# Patient Record
Sex: Female | Born: 1935 | Race: White | Hispanic: No | Marital: Single | Smoking: Never smoker
Health system: Southern US, Community
[De-identification: ages and names within clinical notes are randomized; demographics above are authoritative.]

## PROBLEM LIST (undated history)

## (undated) DIAGNOSIS — I10 Essential (primary) hypertension: Secondary | ICD-10-CM

## (undated) DIAGNOSIS — F79 Unspecified intellectual disabilities: Secondary | ICD-10-CM

## (undated) DIAGNOSIS — M810 Age-related osteoporosis without current pathological fracture: Secondary | ICD-10-CM

## (undated) DIAGNOSIS — F819 Developmental disorder of scholastic skills, unspecified: Secondary | ICD-10-CM

## (undated) DIAGNOSIS — J45909 Unspecified asthma, uncomplicated: Secondary | ICD-10-CM

## (undated) DIAGNOSIS — G40909 Epilepsy, unspecified, not intractable, without status epilepticus: Secondary | ICD-10-CM

## (undated) HISTORY — PX: REDUCTION MAMMAPLASTY: SUR839

## (undated) HISTORY — PX: BREAST RECONSTRUCTION: SHX9

## (undated) HISTORY — PX: DENTAL SURGERY: SHX609

---

## 2007-02-21 ENCOUNTER — Emergency Department: Payer: Self-pay | Admitting: Emergency Medicine

## 2008-04-11 ENCOUNTER — Encounter: Admission: RE | Admit: 2008-04-11 | Discharge: 2008-04-11 | Payer: Self-pay

## 2009-05-15 ENCOUNTER — Encounter: Admission: RE | Admit: 2009-05-15 | Discharge: 2009-05-15 | Payer: Self-pay

## 2010-05-16 ENCOUNTER — Encounter: Admission: RE | Admit: 2010-05-16 | Discharge: 2010-05-16 | Payer: Self-pay | Admitting: Family Medicine

## 2011-07-15 ENCOUNTER — Other Ambulatory Visit: Payer: Self-pay | Admitting: Family Medicine

## 2011-07-15 DIAGNOSIS — Z1231 Encounter for screening mammogram for malignant neoplasm of breast: Secondary | ICD-10-CM

## 2011-08-07 ENCOUNTER — Ambulatory Visit
Admission: RE | Admit: 2011-08-07 | Discharge: 2011-08-07 | Disposition: A | Discharge status: Home / Self Care | Payer: Medicare Other | Source: Ambulatory Visit | Attending: Family Medicine | Admitting: Family Medicine

## 2011-08-07 DIAGNOSIS — Z1231 Encounter for screening mammogram for malignant neoplasm of breast: Secondary | ICD-10-CM

## 2012-12-02 ENCOUNTER — Other Ambulatory Visit: Payer: Self-pay | Admitting: Nurse Practitioner

## 2012-12-02 ENCOUNTER — Other Ambulatory Visit: Payer: Self-pay

## 2012-12-02 DIAGNOSIS — Z1231 Encounter for screening mammogram for malignant neoplasm of breast: Secondary | ICD-10-CM

## 2013-01-05 ENCOUNTER — Ambulatory Visit
Admission: RE | Admit: 2013-01-05 | Discharge: 2013-01-05 | Disposition: A | Discharge status: Home / Self Care | Payer: Medicare Other | Source: Ambulatory Visit | Attending: Nurse Practitioner | Admitting: Nurse Practitioner

## 2013-01-05 DIAGNOSIS — Z1231 Encounter for screening mammogram for malignant neoplasm of breast: Secondary | ICD-10-CM

## 2013-05-06 ENCOUNTER — Other Ambulatory Visit: Payer: Self-pay

## 2013-05-06 DIAGNOSIS — Z1231 Encounter for screening mammogram for malignant neoplasm of breast: Secondary | ICD-10-CM

## 2014-01-06 ENCOUNTER — Ambulatory Visit
Admission: RE | Admit: 2014-01-06 | Discharge: 2014-01-06 | Disposition: A | Discharge status: Home / Self Care | Payer: Medicare Other | Source: Ambulatory Visit

## 2014-01-06 ENCOUNTER — Encounter (INDEPENDENT_AMBULATORY_CARE_PROVIDER_SITE_OTHER): Payer: Self-pay

## 2014-01-06 DIAGNOSIS — Z1231 Encounter for screening mammogram for malignant neoplasm of breast: Secondary | ICD-10-CM

## 2014-04-11 ENCOUNTER — Inpatient Hospital Stay (HOSPITAL_BASED_OUTPATIENT_CLINIC_OR_DEPARTMENT_OTHER)
Admission: EM | Admit: 2014-04-11 | Discharge: 2014-04-14 | DRG: 194 | Disposition: A | Discharge status: Home / Self Care | Payer: Medicare Other | Attending: Internal Medicine | Admitting: Internal Medicine

## 2014-04-11 ENCOUNTER — Emergency Department (HOSPITAL_BASED_OUTPATIENT_CLINIC_OR_DEPARTMENT_OTHER): Payer: Medicare Other

## 2014-04-11 ENCOUNTER — Encounter (HOSPITAL_BASED_OUTPATIENT_CLINIC_OR_DEPARTMENT_OTHER): Payer: Self-pay | Admitting: Emergency Medicine

## 2014-04-11 DIAGNOSIS — T380X5A Adverse effect of glucocorticoids and synthetic analogues, initial encounter: Secondary | ICD-10-CM | POA: Diagnosis not present

## 2014-04-11 DIAGNOSIS — F79 Unspecified intellectual disabilities: Secondary | ICD-10-CM | POA: Diagnosis present

## 2014-04-11 DIAGNOSIS — J45909 Unspecified asthma, uncomplicated: Secondary | ICD-10-CM | POA: Diagnosis present

## 2014-04-11 DIAGNOSIS — F88 Other disorders of psychological development: Secondary | ICD-10-CM | POA: Diagnosis present

## 2014-04-11 DIAGNOSIS — Z66 Do not resuscitate: Secondary | ICD-10-CM | POA: Diagnosis present

## 2014-04-11 DIAGNOSIS — I10 Essential (primary) hypertension: Secondary | ICD-10-CM | POA: Diagnosis present

## 2014-04-11 DIAGNOSIS — Z7982 Long term (current) use of aspirin: Secondary | ICD-10-CM | POA: Diagnosis not present

## 2014-04-11 DIAGNOSIS — J45901 Unspecified asthma with (acute) exacerbation: Secondary | ICD-10-CM | POA: Diagnosis present

## 2014-04-11 DIAGNOSIS — K449 Diaphragmatic hernia without obstruction or gangrene: Secondary | ICD-10-CM | POA: Diagnosis present

## 2014-04-11 DIAGNOSIS — Y9210 Unspecified residential institution as the place of occurrence of the external cause: Secondary | ICD-10-CM | POA: Diagnosis not present

## 2014-04-11 DIAGNOSIS — G40909 Epilepsy, unspecified, not intractable, without status epilepticus: Secondary | ICD-10-CM | POA: Diagnosis present

## 2014-04-11 DIAGNOSIS — M810 Age-related osteoporosis without current pathological fracture: Secondary | ICD-10-CM | POA: Diagnosis present

## 2014-04-11 DIAGNOSIS — R7309 Other abnormal glucose: Secondary | ICD-10-CM | POA: Diagnosis not present

## 2014-04-11 DIAGNOSIS — E039 Hypothyroidism, unspecified: Secondary | ICD-10-CM | POA: Diagnosis present

## 2014-04-11 DIAGNOSIS — J189 Pneumonia, unspecified organism: Secondary | ICD-10-CM | POA: Diagnosis not present

## 2014-04-11 DIAGNOSIS — J4541 Moderate persistent asthma with (acute) exacerbation: Secondary | ICD-10-CM

## 2014-04-11 HISTORY — DX: Epilepsy, unspecified, not intractable, without status epilepticus: G40.909

## 2014-04-11 HISTORY — DX: Unspecified asthma, uncomplicated: J45.909

## 2014-04-11 HISTORY — DX: Age-related osteoporosis without current pathological fracture: M81.0

## 2014-04-11 HISTORY — DX: Essential (primary) hypertension: I10

## 2014-04-11 HISTORY — DX: Unspecified intellectual disabilities: F79

## 2014-04-11 HISTORY — DX: Developmental disorder of scholastic skills, unspecified: F81.9

## 2014-04-11 LAB — CBC WITH DIFFERENTIAL/PLATELET
BASOS ABS: 0 10*3/uL (ref 0.0–0.1)
BASOS PCT: 0 % (ref 0–1)
EOS PCT: 5 % (ref 0–5)
Eosinophils Absolute: 0.7 10*3/uL (ref 0.0–0.7)
HEMATOCRIT: 35.8 % — AB (ref 36.0–46.0)
HEMOGLOBIN: 11.5 g/dL — AB (ref 12.0–15.0)
Lymphocytes Relative: 12 % (ref 12–46)
Lymphs Abs: 1.6 10*3/uL (ref 0.7–4.0)
MCH: 30.3 pg (ref 26.0–34.0)
MCHC: 32.1 g/dL (ref 30.0–36.0)
MCV: 94.2 fL (ref 78.0–100.0)
MONO ABS: 1.3 10*3/uL — AB (ref 0.1–1.0)
MONOS PCT: 9 % (ref 3–12)
Neutro Abs: 10.5 10*3/uL — ABNORMAL HIGH (ref 1.7–7.7)
Neutrophils Relative %: 74 % (ref 43–77)
Platelets: 243 10*3/uL (ref 150–400)
RBC: 3.8 MIL/uL — ABNORMAL LOW (ref 3.87–5.11)
RDW: 13.1 % (ref 11.5–15.5)
WBC: 14.1 10*3/uL — ABNORMAL HIGH (ref 4.0–10.5)

## 2014-04-11 LAB — BASIC METABOLIC PANEL
ANION GAP: 15 (ref 5–15)
BUN: 22 mg/dL (ref 6–23)
CALCIUM: 9.1 mg/dL (ref 8.4–10.5)
CHLORIDE: 97 meq/L (ref 96–112)
CO2: 25 mEq/L (ref 19–32)
CREATININE: 0.9 mg/dL (ref 0.50–1.10)
GFR calc non Af Amer: 61 mL/min — ABNORMAL LOW (ref >90)
GFR, EST AFRICAN AMERICAN: 70 mL/min — AB (ref >90)
Glucose, Bld: 296 mg/dL — ABNORMAL HIGH (ref 70–99)
Potassium: 4.4 mEq/L (ref 3.7–5.3)
Sodium: 137 mEq/L (ref 137–147)

## 2014-04-11 LAB — I-STAT CG4 LACTIC ACID, ED: Lactic Acid, Venous: 1.37 mmol/L (ref 0.5–2.2)

## 2014-04-11 MED ORDER — VANCOMYCIN HCL IN DEXTROSE 750-5 MG/150ML-% IV SOLN
750.0000 mg | Freq: Two times a day (BID) | INTRAVENOUS | Status: DC
  Filled 2014-04-11: qty 150

## 2014-04-11 MED ORDER — ALBUTEROL SULFATE (2.5 MG/3ML) 0.083% IN NEBU
INHALATION_SOLUTION | RESPIRATORY_TRACT | Status: AC
  Administered 2014-04-11: 5 mg
  Filled 2014-04-11: qty 6

## 2014-04-11 MED ORDER — VANCOMYCIN HCL 500 MG IV SOLR
INTRAVENOUS | Status: AC
  Filled 2014-04-11: qty 1500

## 2014-04-11 MED ORDER — VANCOMYCIN HCL 10 G IV SOLR
1250.0000 mg | Freq: Once | INTRAVENOUS | Status: DC
  Filled 2014-04-11: qty 1250

## 2014-04-11 MED ORDER — DEXTROSE 5 % IV SOLN
1.0000 g | Freq: Once | INTRAVENOUS | Status: AC
  Administered 2014-04-11: 1 g via INTRAVENOUS
  Filled 2014-04-11: qty 1

## 2014-04-11 MED ORDER — CEFEPIME HCL 1 G IJ SOLR
INTRAMUSCULAR | Status: AC
  Filled 2014-04-11: qty 1

## 2014-04-11 MED ORDER — METHYLPREDNISOLONE SODIUM SUCC 125 MG IJ SOLR
125.0000 mg | Freq: Once | INTRAMUSCULAR | Status: AC
  Administered 2014-04-11: 125 mg via INTRAVENOUS
  Filled 2014-04-11: qty 2

## 2014-04-11 NOTE — ED Notes (Signed)
Spoke with md regarding changing pt to stepdown, per md status is appropriate per admit md.

## 2014-04-11 NOTE — Progress Notes (Signed)
ANTIBIOTIC CONSULT NOTE - INITIAL  Pharmacy Consult for vancomycin Indication: pneumonia  No Known Allergies  Patient Measurements: Height:  (142.2 cm) Weight: 173 lb (78.472 kg) IBW/kg (Calculated) : 36.3   Vital Signs: Temp: 98.6 F (37 C) (08/25 1928) Temp src: Oral (08/25 1928) BP: 135/76 mmHg (08/25 2100) Pulse Rate: 114 (08/25 1928) Intake/Output from previous day:   Intake/Output from this shift:    Labs:  Recent Labs  04/11/14 1952  WBC 14.1*  HGB 11.5*  PLT 243  CREATININE 0.90   Estimated Creatinine Clearance: 44.7 ml/min (by C-G formula based on Cr of 0.9). No results found for this basename: VANCOTROUGH, VANCOPEAK, VANCORANDOM, GENTTROUGH, GENTPEAK, GENTRANDOM, TOBRATROUGH, TOBRAPEAK, TOBRARND, AMIKACINPEAK, AMIKACINTROU, AMIKACIN,  in the last 72 hours   Microbiology: No results found for this or any previous visit (from the past 720 hour(s)).  Medical History: Past Medical History  Diagnosis Date  . Epilepsia   . Mental retardation   . Hypertension   . Asthma   . Cognitive developmental delay   . Osteoporosis     Assessment: 13 YOF seen with nonproductive cough x3 days along with wheezing. WBC elevated at 14.1, currently afebrile. Blood cultures have been sent. SCr 09 with est CrCl ~44mL/min. Noted cefepime 1g IV x1 was ordered by EDP.  Goal of Therapy:  Vancomycin trough level 15-20 mcg/ml  Plan:  1. Vancomycin  IV x1 as loading dose 2. Vancomycin  IV q12h as maintenance dose 3. Recommend cefepime 1g IV q24h with current renal function  Kathleen Likins D. Hamlet Lasecki, PharmD, BCPS Clinical Pharmacist Pager: [phone] 04/11/2014 10:20 PM

## 2014-04-11 NOTE — ED Notes (Signed)
Per caregiver pt c/o SOB with nonproductive cough 3days, audible wheezing noted

## 2014-04-11 NOTE — ED Notes (Signed)
Cefepime not completed b/c pt bending arm frequently, vancomycin mixed and sent with [facility].

## 2014-04-11 NOTE — ED Notes (Signed)
MD at bedside to update pt in results of testing and plan of care.

## 2014-04-11 NOTE — ED Notes (Signed)
Patient transported to CT via stretcher per tech. 

## 2014-04-11 NOTE — ED Provider Notes (Signed)
CSN: [id number]     Arrival date & time 04/11/14  1924 History  This chart was scribed for Vanetta Mulders, MD by Roxy Cedar, ED Scribe. This patient was seen in room MH09/MH09 and the patient's care was started at 7:40 PM.   LEVEL 5 CAVEAT Chief Complaint  Patient presents with  . Shortness of Breath   Patient is a 79 y.o. female presenting with shortness of breath. The history is provided by the patient and a caregiver. The history is limited by the condition of the patient. No language interpreter was used.  Shortness of Breath Associated symptoms: cough and wheezing   Cough:    Cough characteristics:  Non-productive   Duration:  3 days Wheezing:    Severity:  Moderate   Duration:  3 days Due to her history of cognitive developmental delay, Level 5 caveat applies.  HPI Comments: Leslie Chandler is a 79 y.o. female with a history of asthma, mental retardation, epilepsia, and cognitive developmental delay, brought in by her caregiver, who presents to the Emergency Department complaining shortness of breath that began today. Her caregiver states she had onset of an associated non-productive cough that began 2 days ago. She complained her chest was hurting for the past 2 days. Patient is currently wheezing bile upon arrival to the ER. Patient was given Zithromax on 04/06/14. The patient uses an inhaler when needed. Patient denies associated fever.    Past Medical History  Diagnosis Date  . Epilepsia   . Mental retardation   . Hypertension   . Asthma   . Cognitive developmental delay   . Osteoporosis    No past surgical history on file. No family history on file. History  Substance Use Topics  . Smoking status: Never Smoker   . Smokeless tobacco: Not on file  . Alcohol Use: No   OB History   Grav Para Term Preterm Abortions TAB SAB Ect Mult Living                 Review of Systems  Unable to perform ROS Respiratory: Positive for cough, shortness of breath and wheezing.    Psychiatric/Behavioral: Positive for confusion.   Due to her history of cognitive developmental delay, Level 5 caveat applies. Allergies  Review of patient's allergies indicates no known allergies.  Home Medications   Prior to Admission medications   Medication Sig Start Date End Date Taking? Authorizing Provider  albuterol (PROVENTIL HFA;VENTOLIN HFA) 108 (90 BASE) MCG/ACT inhaler Inhale into the lungs every 6 (six) hours as needed for wheezing or shortness of breath.   Yes Historical Provider, MD  albuterol (PROVENTIL) (2.5 MG/3ML) 0.083% nebulizer solution Take 2.5 mg by nebulization every 6 (six) hours as needed for wheezing or shortness of breath.   Yes Historical Provider, MD  aspirin 81 MG chewable tablet Chew by mouth daily.   Yes Historical Provider, MD  ferrous sulfate 220 (44 FE) MG/5ML solution Take 220 mg by mouth daily.   Yes Historical Provider, MD  lactulose (CEPHULAC) 10 G packet Take 10 g by mouth 3 (three) times daily.   Yes Historical Provider, MD  levothyroxine (SYNTHROID, LEVOTHROID) 88 MCG tablet Take 88 mcg by mouth daily before breakfast.   Yes Historical Provider, MD  lisinopril-hydrochlorothiazide (PRINZIDE,ZESTORETIC) 10-12.5 MG per tablet Take 1 tablet by mouth daily.   Yes Historical Provider, MD  montelukast (SINGULAIR) 10 MG tablet Take 10 mg by mouth at bedtime.   Yes Historical Provider, MD  phenytoin (DILANTIN) 100 MG/4ML suspension  Take 125 mg by mouth 3 (three) times daily.   Yes Historical Provider, MD  raloxifene (EVISTA) 60 MG tablet Take 60 mg by mouth daily.   Yes Historical Provider, MD   Triage Vitals: BP 133/74  Pulse 114  Temp(Src) 98.6 F (37 C) (Oral)  Resp 20  Ht  (1.422 m)  Wt 173 lb (78.472 kg)  BMI 38.81 kg/m2  SpO2 89% Physical Exam  Nursing note and vitals reviewed. Constitutional: She is oriented to person, place, and time. She appears well-developed and well-nourished.  HENT:  Head: Normocephalic.  Mouth/Throat:  Oropharynx is clear and moist.  Eyes: Conjunctivae and EOM are normal. Pupils are equal, round, and reactive to light.  Neck: Normal range of motion. Neck supple.  Cardiovascular: Normal heart sounds.   Tachycardic  Pulmonary/Chest: She has wheezes.  Abdominal: Soft. Bowel sounds are normal. There is no tenderness.  Musculoskeletal: She exhibits edema (trace swelling in legs bilaterally).  Neurological: She is alert and oriented to person, place, and time. No cranial nerve deficit. She exhibits normal muscle tone. Coordination normal.  Skin: No rash noted.    ED Course  Procedures (including critical care time)  DIAGNOSTIC STUDIES: Oxygen Saturation is 89% on [place], low by my interpretation.    COORDINATION OF CARE: 7:43 PM- Discussed plans to order diagnostic lab work and imaging. Pt advised of plan for treatment and pt agrees.  Medications  ceFEPIme (MAXIPIME) 1 g in dextrose 5 % 50 mL IVPB (1 g Intravenous New Bag/Given 04/11/14 2234)  vancomycin (VANCOCIN) 1,250 mg in sodium chloride 0.9 % 250 mL IVPB (not administered)  vancomycin (VANCOCIN) IVPB 750 mg/150 ml premix (not administered)  albuterol (PROVENTIL) (2.5 MG/3ML) 0.083% nebulizer solution (5 mg  Given 04/11/14 1947)  albuterol (PROVENTIL) (2.5 MG/3ML) 0.083% nebulizer solution (5 mg  Given 04/11/14 2055)  methylPREDNISolone sodium succinate (SOLU-MEDROL) 125 mg/2 mL injection 125 mg (125 mg Intravenous Given 04/11/14 2233)  ceFEPIme (MAXIPIME) 1 G injection (  Duplicate 04/11/14 2234)   Results for orders placed during the hospital encounter of 04/11/14  CBC WITH DIFFERENTIAL      Result Value Ref Range   WBC 14.1 (*) 4.0 - 10.5 K/uL   RBC 3.80 (*) 3.87 - 5.11 MIL/uL   Hemoglobin 11.5 (*) 12.0 - 15.0 g/dL   HCT 16.1 (*) 09.6 - 04.5 %   MCV 94.2  78.0 - 100.0 fL   MCH 30.3  26.0 - 34.0 pg   MCHC 32.1  30.0 - 36.0 g/dL   RDW 40.9  81.1 - 91.4 %   Platelets 243  150 - 400 K/uL   Neutrophils Relative % 74  43 - 77 %    Neutro Abs 10.5 (*) 1.7 - 7.7 K/uL   Lymphocytes Relative 12  12 - 46 %   Lymphs Abs 1.6  0.7 - 4.0 K/uL   Monocytes Relative 9  3 - 12 %   Monocytes Absolute 1.3 (*) 0.1 - 1.0 K/uL   Eosinophils Relative 5  0 - 5 %   Eosinophils Absolute 0.7  0.0 - 0.7 K/uL   Basophils Relative 0  0 - 1 %   Basophils Absolute 0.0  0.0 - 0.1 K/uL  BASIC METABOLIC PANEL      Result Value Ref Range   Sodium 137  137 - 147 mEq/L   Potassium 4.4  3.7 - 5.3 mEq/L   Chloride 97  96 - 112 mEq/L   CO2 25  19 - 32 mEq/L  Glucose, Bld 296 (*) 70 - 99 mg/dL   BUN 22  6 - 23 mg/dL   Creatinine, Ser 0.45  0.50 - 1.10 mg/dL   Calcium 9.1  8.4 - 40.9 mg/dL   GFR calc non Af Amer 61 (*) >90 mL/min   GFR calc Af Amer 70 (*) >90 mL/min   Anion gap 15  5 - 15  I-STAT CG4 LACTIC ACID, ED      Result Value Ref Range   Lactic Acid, Venous 1.37  0.5 - 2.2 mmol/L   Dg Chest Portable 1 View  04/11/2014   CLINICAL DATA:  Wheezing and shortness of breath. Central chest pain and nausea.  EXAM: PORTABLE CHEST - 1 VIEW  COMPARISON:  None.  FINDINGS: Heart size and pulmonary vascularity are normal. The markings are slightly accentuated by shallow inspiration. There is a focal area of increased density at the right base medially which may represent an infiltrate or atelectasis. The patient has a large hiatal hernia. No visible effusions. No acute osseous abnormality.  IMPRESSION: Focal small area of atelectasis or infiltrate at the right lung base medially. Large hiatal hernia.   Electronically Signed   By: Geanie Cooley M.D.   On: 04/11/2014 20:15      Results for orders placed during the hospital encounter of 04/11/14  CBC WITH DIFFERENTIAL      Result Value Ref Range   WBC 14.1 (*) 4.0 - 10.5 K/uL   RBC 3.80 (*) 3.87 - 5.11 MIL/uL   Hemoglobin 11.5 (*) 12.0 - 15.0 g/dL   HCT 81.1 (*) 91.4 - 78.2 %   MCV 94.2  78.0 - 100.0 fL   MCH 30.3  26.0 - 34.0 pg   MCHC 32.1  30.0 - 36.0 g/dL   RDW 95.6  21.3 - 08.6 %   Platelets  243  150 - 400 K/uL   Neutrophils Relative % 74  43 - 77 %   Neutro Abs 10.5 (*) 1.7 - 7.7 K/uL   Lymphocytes Relative 12  12 - 46 %   Lymphs Abs 1.6  0.7 - 4.0 K/uL   Monocytes Relative 9  3 - 12 %   Monocytes Absolute 1.3 (*) 0.1 - 1.0 K/uL   Eosinophils Relative 5  0 - 5 %   Eosinophils Absolute 0.7  0.0 - 0.7 K/uL   Basophils Relative 0  0 - 1 %   Basophils Absolute 0.0  0.0 - 0.1 K/uL  BASIC METABOLIC PANEL      Result Value Ref Range   Sodium 137  137 - 147 mEq/L   Potassium 4.4  3.7 - 5.3 mEq/L   Chloride 97  96 - 112 mEq/L   CO2 25  19 - 32 mEq/L   Glucose, Bld 296 (*) 70 - 99 mg/dL   BUN 22  6 - 23 mg/dL   Creatinine, Ser 5.78  0.50 - 1.10 mg/dL   Calcium 9.1  8.4 - 46.9 mg/dL   GFR calc non Af Amer 61 (*) >90 mL/min   GFR calc Af Amer 70 (*) >90 mL/min   Anion gap 15  5 - 15  I-STAT CG4 LACTIC ACID, ED      Result Value Ref Range   Lactic Acid, Venous 1.37  0.5 - 2.2 mmol/L   No results found.  Date: 04/11/2014  Rate: 111  Rhythm: sinus tachycardia  QRS Axis: normal  Intervals: normal  ST/T Wave abnormalities: normal  Conduction Disutrbances:none  Narrative Interpretation:  Old EKG Reviewed: none available    MDM   Final diagnoses:  Asthma, moderate persistent, with acute exacerbation  HCAP (healthcare-associated pneumonia)    A patient with a history of asthma has persistent bronchospasm. CT scan not consistent with a left lower lobe pneumonia. Patient or on Zithromax we'll treat this as if it's healthcare acquired pneumonia. 3 advice ordered blood cultures done before hand. Patient has an oxygen requirement not normally on oxygen. Patient's requiring 2 L nasal can oxygen to maintain oxygen sats above 90%. Patient with some mild tachycardia no hypotension. Patient started on Solu-Medrol after 2 nebulizer treatments. Wheezing improves with nebulizer treatment and comes back again. Patient's lactic acid is not elevated.  Discuss with the [facility] team at  [facility]. [facility] wanted to be admitted to [facility]. Shortly no patient is not a DO NOT RESUSCITATE. Patient will be admitted to telemetry. Temporary admit orders done and Intal completed.   I personally performed the services described in this documentation, which was scribed in my presence. The recorded information has been reviewed and is accurate.     Vanetta Mulders, MD 04/11/14 [phone]

## 2014-04-12 ENCOUNTER — Encounter (HOSPITAL_COMMUNITY): Payer: Self-pay | Admitting: *Deleted

## 2014-04-12 ENCOUNTER — Inpatient Hospital Stay (HOSPITAL_COMMUNITY): Payer: Medicare Other

## 2014-04-12 DIAGNOSIS — J45909 Unspecified asthma, uncomplicated: Secondary | ICD-10-CM | POA: Diagnosis present

## 2014-04-12 DIAGNOSIS — I10 Essential (primary) hypertension: Secondary | ICD-10-CM

## 2014-04-12 DIAGNOSIS — J45901 Unspecified asthma with (acute) exacerbation: Secondary | ICD-10-CM

## 2014-04-12 DIAGNOSIS — J189 Pneumonia, unspecified organism: Secondary | ICD-10-CM | POA: Diagnosis present

## 2014-04-12 DIAGNOSIS — F79 Unspecified intellectual disabilities: Secondary | ICD-10-CM | POA: Diagnosis present

## 2014-04-12 LAB — COMPREHENSIVE METABOLIC PANEL
ALT: 11 U/L (ref 0–35)
AST: 13 U/L (ref 0–37)
Albumin: 2.9 g/dL — ABNORMAL LOW (ref 3.5–5.2)
Alkaline Phosphatase: 97 U/L (ref 39–117)
Anion gap: 12 (ref 5–15)
BUN: 17 mg/dL (ref 6–23)
CO2: 25 mEq/L (ref 19–32)
Calcium: 8.7 mg/dL (ref 8.4–10.5)
Chloride: 101 mEq/L (ref 96–112)
Creatinine, Ser: 0.79 mg/dL (ref 0.50–1.10)
GFR calc Af Amer: >90 mL/min (ref >90)
GFR calc non Af Amer: 79 mL/min — ABNORMAL LOW (ref >90)
Glucose, Bld: 220 mg/dL — ABNORMAL HIGH (ref 70–99)
Potassium: 4.2 mEq/L (ref 3.7–5.3)
Sodium: 138 mEq/L (ref 137–147)
Total Bilirubin: <0.2 mg/dL — ABNORMAL LOW (ref 0.3–1.2)
Total Protein: 6.9 g/dL (ref 6.0–8.3)

## 2014-04-12 LAB — INFLUENZA PANEL BY PCR (TYPE A & B)
H1N1 flu by pcr: NOT DETECTED
INFLAPCR: NEGATIVE
Influenza B By PCR: NEGATIVE

## 2014-04-12 LAB — GLUCOSE, CAPILLARY
Glucose-Capillary: 195 mg/dL — ABNORMAL HIGH (ref 70–99)
Glucose-Capillary: 107 mg/dL — ABNORMAL HIGH (ref 70–99)
Glucose-Capillary: 185 mg/dL — ABNORMAL HIGH (ref 70–99)
Glucose-Capillary: 262 mg/dL — ABNORMAL HIGH (ref 70–99)

## 2014-04-12 LAB — HEMOGLOBIN A1C
Hgb A1c MFr Bld: 6.2 % — ABNORMAL HIGH (ref <5.7)
MEAN PLASMA GLUCOSE: 131 mg/dL — AB (ref <117)

## 2014-04-12 LAB — PROTIME-INR
INR: 1.16 (ref 0.00–1.49)
PROTHROMBIN TIME: 14.8 s (ref 11.6–15.2)

## 2014-04-12 LAB — CBC WITH DIFFERENTIAL/PLATELET
Basophils Absolute: 0 10*3/uL (ref 0.0–0.1)
Basophils Relative: 0 % (ref 0–1)
Eosinophils Absolute: 0 10*3/uL (ref 0.0–0.7)
Eosinophils Relative: 0 % (ref 0–5)
HCT: 33.5 % — ABNORMAL LOW (ref 36.0–46.0)
Hemoglobin: 11 g/dL — ABNORMAL LOW (ref 12.0–15.0)
Lymphocytes Relative: 5 % — ABNORMAL LOW (ref 12–46)
Lymphs Abs: 0.6 10*3/uL — ABNORMAL LOW (ref 0.7–4.0)
MCH: 29.9 pg (ref 26.0–34.0)
MCHC: 32.8 g/dL (ref 30.0–36.0)
MCV: 91 fL (ref 78.0–100.0)
Monocytes Absolute: 0.2 10*3/uL (ref 0.1–1.0)
Monocytes Relative: 2 % — ABNORMAL LOW (ref 3–12)
Neutro Abs: 11.4 10*3/uL — ABNORMAL HIGH (ref 1.7–7.7)
Neutrophils Relative %: 93 % — ABNORMAL HIGH (ref 43–77)
Platelets: 234 10*3/uL (ref 150–400)
RBC: 3.68 MIL/uL — ABNORMAL LOW (ref 3.87–5.11)
RDW: 13.3 % (ref 11.5–15.5)
WBC: 12.2 10*3/uL — ABNORMAL HIGH (ref 4.0–10.5)

## 2014-04-12 LAB — PHENYTOIN LEVEL, TOTAL: Phenytoin Lvl: 6.9 ug/mL — ABNORMAL LOW (ref 10.0–20.0)

## 2014-04-12 MED ORDER — LORAZEPAM 0.5 MG PO TABS
0.5000 mg | ORAL_TABLET | Freq: Four times a day (QID) | ORAL | Status: DC | PRN
  Administered 2014-04-12: 0.5 mg via ORAL
  Filled 2014-04-12: qty 1

## 2014-04-12 MED ORDER — IPRATROPIUM BROMIDE 0.02 % IN SOLN: 0.5000 mg | RESPIRATORY_TRACT | Status: DC

## 2014-04-12 MED ORDER — LEVOFLOXACIN IN D5W 750 MG/150ML IV SOLN
750.0000 mg | INTRAVENOUS | Status: DC
  Administered 2014-04-12: 750 mg via INTRAVENOUS
  Filled 2014-04-12: qty 150

## 2014-04-12 MED ORDER — LACTULOSE 10 GM/15ML PO SOLN
10.0000 g | Freq: Three times a day (TID) | ORAL | Status: DC
  Administered 2014-04-12 – 2014-04-14 (×5): 10 g via ORAL
  Filled 2014-04-12 (×9): qty 15

## 2014-04-12 MED ORDER — INSULIN ASPART 100 UNIT/ML ~~LOC~~ SOLN
0.0000 [IU] | Freq: Three times a day (TID) | SUBCUTANEOUS | Status: DC
  Administered 2014-04-12 – 2014-04-13 (×3): 2 [IU] via SUBCUTANEOUS
  Administered 2014-04-13: 1 [IU] via SUBCUTANEOUS
  Administered 2014-04-13: 3 [IU] via SUBCUTANEOUS
  Administered 2014-04-14: 2 [IU] via SUBCUTANEOUS
  Administered 2014-04-14: 1 [IU] via SUBCUTANEOUS

## 2014-04-12 MED ORDER — RESOURCE THICKENUP CLEAR PO POWD
ORAL | Status: DC | PRN
  Filled 2014-04-12: qty 125

## 2014-04-12 MED ORDER — PHENYTOIN 125 MG/5ML PO SUSP
125.0000 mg | Freq: Two times a day (BID) | ORAL | Status: DC
  Administered 2014-04-12 – 2014-04-14 (×5): 125 mg via ORAL
  Filled 2014-04-12 (×7): qty 5

## 2014-04-12 MED ORDER — MONTELUKAST SODIUM 10 MG PO TABS
10.0000 mg | ORAL_TABLET | Freq: Every day | ORAL | Status: DC
  Administered 2014-04-12 – 2014-04-13 (×2): 10 mg via ORAL
  Filled 2014-04-12 (×4): qty 1

## 2014-04-12 MED ORDER — METHYLPREDNISOLONE SODIUM SUCC 125 MG IJ SOLR
60.0000 mg | Freq: Four times a day (QID) | INTRAMUSCULAR | Status: DC
  Administered 2014-04-12 (×4): 60 mg via INTRAVENOUS
  Filled 2014-04-12: qty 2
  Filled 2014-04-12 (×6): qty 0.96

## 2014-04-12 MED ORDER — ASPIRIN 81 MG PO CHEW
81.0000 mg | CHEWABLE_TABLET | Freq: Every day | ORAL | Status: DC
  Administered 2014-04-12 – 2014-04-14 (×3): 81 mg via ORAL
  Filled 2014-04-12 (×2): qty 1

## 2014-04-12 MED ORDER — RALOXIFENE HCL 60 MG PO TABS
60.0000 mg | ORAL_TABLET | Freq: Every day | ORAL | Status: DC
  Administered 2014-04-12 – 2014-04-14 (×3): 60 mg via ORAL
  Filled 2014-04-12 (×3): qty 1

## 2014-04-12 MED ORDER — LEVALBUTEROL HCL 0.63 MG/3ML IN NEBU
0.6300 mg | INHALATION_SOLUTION | Freq: Four times a day (QID) | RESPIRATORY_TRACT | Status: DC
  Administered 2014-04-12 – 2014-04-14 (×10): 0.63 mg via RESPIRATORY_TRACT
  Filled 2014-04-12 (×21): qty 3

## 2014-04-12 MED ORDER — IPRATROPIUM BROMIDE 0.02 % IN SOLN
0.5000 mg | Freq: Four times a day (QID) | RESPIRATORY_TRACT | Status: DC
  Administered 2014-04-12 – 2014-04-14 (×10): 0.5 mg via RESPIRATORY_TRACT
  Filled 2014-04-12 (×9): qty 2.5

## 2014-04-12 MED ORDER — INSULIN ASPART 100 UNIT/ML ~~LOC~~ SOLN
0.0000 [IU] | Freq: Every day | SUBCUTANEOUS | Status: DC
  Administered 2014-04-12: 3 [IU] via SUBCUTANEOUS

## 2014-04-12 MED ORDER — LEVALBUTEROL HCL 0.63 MG/3ML IN NEBU
0.6300 mg | INHALATION_SOLUTION | RESPIRATORY_TRACT | Status: DC
  Administered 2014-04-12: 0.63 mg via RESPIRATORY_TRACT
  Filled 2014-04-12: qty 3

## 2014-04-12 MED ORDER — LEVALBUTEROL HCL 0.63 MG/3ML IN NEBU: 0.6300 mg | INHALATION_SOLUTION | Freq: Four times a day (QID) | RESPIRATORY_TRACT | Status: DC

## 2014-04-12 MED ORDER — PHENYTOIN 125 MG/5ML PO SUSP
125.0000 mg | Freq: Three times a day (TID) | ORAL | Status: DC
  Filled 2014-04-12 (×4): qty 5

## 2014-04-12 MED ORDER — LACTULOSE 10 G PO PACK: 10.0000 g | PACK | Freq: Three times a day (TID) | ORAL | Status: DC

## 2014-04-12 MED ORDER — ENOXAPARIN SODIUM 40 MG/0.4ML ~~LOC~~ SOLN
40.0000 mg | SUBCUTANEOUS | Status: DC
Start: 2014-04-12 — End: 2014-04-14
  Administered 2014-04-12 – 2014-04-14 (×3): 40 mg via SUBCUTANEOUS
  Filled 2014-04-12 (×3): qty 0.4

## 2014-04-12 MED ORDER — IPRATROPIUM-ALBUTEROL 0.5-2.5 (3) MG/3ML IN SOLN: 3.0000 mL | RESPIRATORY_TRACT | Status: DC

## 2014-04-12 MED ORDER — LEVOTHYROXINE SODIUM 88 MCG PO TABS
88.0000 ug | ORAL_TABLET | Freq: Every day | ORAL | Status: DC
  Administered 2014-04-13 – 2014-04-14 (×2): 88 ug via ORAL
  Filled 2014-04-12 (×4): qty 1

## 2014-04-12 NOTE — Evaluation (Signed)
Clinical/Bedside Swallow Evaluation Patient Details  Name: Leslie Chandler MRN: [id number] Date of Birth: [birth year]  Today's Date: 04/12/2014 Time: 8657-8469 SLP Time Calculation (min): 17 min  Past Medical History:  Past Medical History  Diagnosis Date  . Epilepsia   . Mental retardation   . Hypertension   . Asthma   . Cognitive developmental delay   . Osteoporosis    Past Surgical History: History reviewed. No pertinent past surgical history. HPI:  79 y.o. with past medical history of developmental disorder, hypertension, asthma, osteoporosis and mental retardation (cognitive developmental delay) admitted with SOB and cough.  Per MD note pt.  had an episode of choking while she was coughing today.  Treated for bronchitis one week ago.  CT 8/25 revealed airspace disease in the right lung base probably representing pneumonia. Large esophageal hiatal hernia.    Assessment / Plan / Recommendation Clinical Impression  Indications of pharyngeal deficits present during po trials.  Given clinical observation, caregiver reports of coughing when eating and current pna, recommend MBS (scheduled today at 11:30).    Aspiration Risk  Moderate    Diet Recommendation NPO        Other  Recommendations Recommended Consults: MBS Oral Care Recommendations: Oral care BID   Follow Up Recommendations   (TBD)    Frequency and Duration        Pertinent Vitals/Pain WDL         Swallow Study           Oral/Motor/Sensory Function Overall Oral Motor/Sensory Function: Appears within functional limits for tasks assessed   Ice Chips Ice chips: Not tested   Thin Liquid Thin Liquid: Impaired Presentation: Cup;Straw Pharyngeal  Phase Impairments: Cough - Delayed (audible swallow)    Nectar Thick Nectar Thick Liquid: Not tested   Honey Thick Honey Thick Liquid: Not tested   Puree Puree: Impaired Presentation: Spoon;Self Fed Pharyngeal Phase Impairments:  (audible swallow)   Solid   GO  Solid: Not tested       Royce Macadamia 04/12/2014,8:51 AM [phone]

## 2014-04-12 NOTE — Care Management Note (Addendum)
  Page 1 of 1   04/12/2014     1:28:43 PM CARE MANAGEMENT NOTE 04/12/2014  Patient:  Leslie Chandler, Leslie Chandler   Account Number:  [id number]  Date Initiated:  04/12/2014  Documentation initiated by:  Mandela Bello  Subjective/Objective Assessment:   SOB with cough     Action/Plan:   CM to follow for disposition needs   Anticipated DC Date:  04/15/2014   Anticipated DC Plan:  HOME/SELF CARE         Choice offered to / List presented to:             Status of service:  Completed, signed off Medicare Important Message given?   (If response is "NO", the following Medicare IM given date fields will be blank) Date Medicare IM given:   Medicare IM given by:   Date Additional Medicare IM given:   Additional Medicare IM given by:    Discharge Disposition:  HOME/SELF CARE  Per UR Regulation:  Reviewed for med. necessity/level of care/duration of stay  If discussed at Long Length of Stay Meetings, dates discussed:    Comments:  Krystan Northrop RN, BSN, MSHL, CCM  Nurse - Case Manager,  ([facility])  [phone]  04/12/2014 ADM:  SOB with Cough CAP (community acquired pneumonia) Active Problems:  Mental retardation, Hypertension, Asthma PMH: Epilepsia,  Mental retardation,  Hypertension, Asthma, Cognitive developmental delay, Osteoporosis Social:  From home with 24/7 caregiver Disposition Plan:  home with caregiver CM will continue to monitor for disposition needs.

## 2014-04-12 NOTE — Progress Notes (Signed)
Pt seen and examined, admitted this am per Dr.Patel pls see H&P for details 78/F with developmental disorder, Epilepsy from home with 24x7 caregiver , admitted with CAP, Asthma exacerbation -continue IV levaquin, IV steroids, nebs -check Swallow eval -wean O2  Zannie Cove, MD [phone]

## 2014-04-12 NOTE — Progress Notes (Signed)
Patient arrived from [facility] via [facility]. Patient is a total assist.  Patient is on 3L O2 with labored breathing, respirations in the mid-20's and audible wheezes. Family and caregiver bedside. Fall and safety plan reviewed with both caregiver and family.  Admission weight, vitals and assessment completed. MD on call notified of patient's arrival, awaiting admission orders. Call light within reach. Will continue to monitor. Blood pressure 149/82, pulse 109, temperature 97.9 F (36.6 C), temperature source Oral, resp. rate 26, height  (1.422 m), weight 78.019 kg (172 lb), SpO2 96.00%. Troy Sine

## 2014-04-12 NOTE — Progress Notes (Signed)
UR completed Emmagrace Runkel K. Wrigley Plasencia, RN, BSN, MSHL, CCM  04/12/2014 1:19 PM

## 2014-04-12 NOTE — Procedures (Signed)
Objective Swallowing Evaluation: Modified Barium Swallowing Study  Patient Details  Name: Leslie Chandler MRN: [id number] Date of Birth: [birth year]  Today's Date: 04/12/2014 Time: 1205-1230 SLP Time Calculation (min): 25 min  Past Medical History:  Past Medical History  Diagnosis Date  . Epilepsia   . Mental retardation   . Hypertension   . Asthma   . Cognitive developmental delay   . Osteoporosis    Past Surgical History: History reviewed. No pertinent past surgical history. HPI:  79 y.o. with past medical history of developmental disorder, hypertension, asthma, osteoporosis and mental retardation (cognitive developmental delay) admitted with SOB and cough.  Per MD note pt.  had an episode of choking while she was coughing today.  Treated for bronchitis one week ago.  CT 8/25 revealed airspace disease in the right lung base probably representing pneumonia. Large esophageal hiatal hernia.      Assessment / Plan / Recommendation Clinical Impression  Dysphagia Diagnosis: Mild pharyngeal phase dysphagia;Moderate pharyngeal phase dysphagia Clinical impression: Pt. exhibited a mild-moderate oropharyngeal dysphagia characterized by motor deficits.  Decreased oral cohesion lead to silent aspiration before the swallow with straw sips thin barium and aspiration with cup sip due to decreased laryngeal elevation and epiglottic inversion.  Mild sensation present as pt. produced a non functional delayed cough.  No significant pharyngeal residue.  Recommend Dys 3 diet texture and nectar thick liquids and full supervision with ST follow up.     Treatment Recommendation  Therapy as outlined in treatment plan below    Diet Recommendation Dysphagia 3 (Mechanical Soft);Nectar-thick liquid   Liquid Administration via: Cup;Straw Medication Administration: Whole meds with puree Supervision: Patient able to self feed;Full supervision/cueing for compensatory strategies Compensations: Slow rate;Small  sips/bites Postural Changes and/or Swallow Maneuvers: Seated upright 90 degrees    Other  Recommendations Oral Care Recommendations: Oral care BID   Follow Up Recommendations   (TBD)    Frequency and Duration min 2x/week  2 weeks   Pertinent Vitals/Pain WDL           Reason for Referral Objectively evaluate swallowing function   Oral Phase Oral Preparation/Oral Phase Oral Phase: Impaired Oral - Nectar Oral - Nectar Teaspoon:  (decreased labial seal on spoon) Oral - Nectar Cup:  (decreased labial seal on spoon) Oral - Thin Oral - Thin Teaspoon:  (decreased labial seal on spoon) Oral - Thin Cup:  (decreased labial seal on spoon) Oral - Thin Straw:  (decreased oral cohesion)   Pharyngeal Phase Pharyngeal Phase Pharyngeal Phase: Impaired Pharyngeal - Nectar Pharyngeal - Nectar Teaspoon: Within functional limits Pharyngeal - Nectar Cup: Within functional limits Pharyngeal - Thin Pharyngeal - Thin Teaspoon: Within functional limits Pharyngeal - Thin Cup: Penetration/Aspiration during swallow;Reduced laryngeal elevation;Reduced airway/laryngeal closure Penetration/Aspiration details (thin cup): Material enters airway, remains ABOVE vocal cords and not ejected out;Material enters airway, passes BELOW cords without attempt by patient to eject out (silent aspiration) Pharyngeal - Thin Straw: Reduced laryngeal elevation;Reduced airway/laryngeal closure;Penetration/Aspiration before swallow Penetration/Aspiration details (thin straw): Material enters airway, passes BELOW cords without attempt by patient to eject out (silent aspiration) Pharyngeal - Solids Pharyngeal - Puree: Within functional limits Pharyngeal - Regular: Within functional limits  Cervical Esophageal Phase    GO    Cervical Esophageal Phase Cervical Esophageal Phase: Leonarda Salon         Royce Macadamia 04/12/2014, 2:20 PM [phone]

## 2014-04-12 NOTE — H&P (Signed)
[facility] History and Physical  Patient: Leslie Chandler  ZOX:[id number]  DOB: Nov 09, 1934  DOS: the patient was seen and examined on 04/12/2014 PCP: No primary provider on file.  Chief Complaint: Shortness of breath with cough  HPI: Leslie Chandler is a 79 y.o. female with Past medical history of developmental disorder, hypertension, asthma, osteoporosis. The patient is presenting with complaints of shortness of breath with cough. The patient's history was provided by caregiver. Caregiver mentions that since last 3-4 days the patient has been having progressively worsening cough which is nonproductive associated with progressively worsening shortness of breath. She also had an episode of choking while she was coughing today. No fever reported but sad some chills. This she had a history of a recent bronchitis for which she was treated with azithromycin for 3 days and she has completed that treatment one week ago. Patient denied any complaint of chest pain. There was no nausea or vomiting diarrhea or burning urination reported that there was no focal deficit but there was no recent change in her medication. Patient is from home and does not use any oxygen at her baseline. No sick contact no recent travel.  The patient is coming from home. And at her baseline dependent for most of her ADL.  Review of Systems: as mentioned in the history of present illness.  A Comprehensive review of the other systems is negative.  Past Medical History  Diagnosis Date  . Epilepsia   . Mental retardation   . Hypertension   . Asthma   . Cognitive developmental delay   . Osteoporosis    History reviewed. No pertinent past surgical history. Social History:  reports that she has never smoked. She has never used smokeless tobacco. She reports that she does not drink alcohol or use illicit drugs.  No Known Allergies  History reviewed. No pertinent family history.  Prior to Admission medications   Medication  Sig Start Date End Date Taking? Authorizing Provider  albuterol (PROVENTIL HFA;VENTOLIN HFA) 108 (90 BASE) MCG/ACT inhaler Inhale into the lungs every 6 (six) hours as needed for wheezing or shortness of breath.   Yes Historical Provider, MD  albuterol (PROVENTIL) (2.5 MG/3ML) 0.083% nebulizer solution Take 2.5 mg by nebulization every 6 (six) hours as needed for wheezing or shortness of breath.   Yes Historical Provider, MD  aspirin 81 MG chewable tablet Chew by mouth daily.   Yes Historical Provider, MD  ferrous sulfate 220 (44 FE) MG/5ML solution Take 220 mg by mouth daily.   Yes Historical Provider, MD  lactulose (CEPHULAC) 10 G packet Take 10 g by mouth 3 (three) times daily.   Yes Historical Provider, MD  levothyroxine (SYNTHROID, LEVOTHROID) 88 MCG tablet Take 88 mcg by mouth daily before breakfast.   Yes Historical Provider, MD  lisinopril-hydrochlorothiazide (PRINZIDE,ZESTORETIC) 10-12.5 MG per tablet Take 1 tablet by mouth daily.   Yes Historical Provider, MD  montelukast (SINGULAIR) 10 MG tablet Take 10 mg by mouth at bedtime.   Yes Historical Provider, MD  phenytoin (DILANTIN) 100 MG/4ML suspension Take 125 mg by mouth 3 (three) times daily.   Yes Historical Provider, MD  raloxifene (EVISTA) 60 MG tablet Take 60 mg by mouth daily.   Yes Historical Provider, MD    Physical Exam: Filed Vitals:   04/11/14 2227 04/11/14 2313 04/12/14 0021 04/12/14 0126  BP: 155/84 146/81 149/82   Pulse: 114 118 109   Temp: 98.2 F (36.8 C) 98.4 F (36.9 C) 97.9 F (36.6 C)  TempSrc: Oral Oral Oral   Resp: Height:    (1.422 m)   Weight:   78.019 kg (172 lb)   SpO2: 94% 94% 96% 94%    General: Alert, Awake and Oriented to Time, Place and Person. Appear in mild distress Eyes: PERRL ENT: Oral Mucosa clear moist. Neck: no JVD Cardiovascular: S1 and S2 Present, no Murmur, Peripheral Pulses Present Respiratory: Bilateral Air entry equal and Decreased, extensive rhonchi and  bilateral expiratory wheezes Abdomen: Bowel Sound Present, Soft and Non tender Skin: no Rash Extremities: Trace Pedal edema, no calf tenderness Neurologic: Grossly no focal neuro deficit  Labs on Admission:  CBC:  Recent Labs Lab 04/11/14 1952 04/12/14 0350  WBC 14.1* 12.2*  NEUTROABS 10.5* 11.4*  HGB 11.5* 11.0*  HCT 35.8* 33.5*  MCV 94.2 91.0  PLT 243 234    CMP     Component Value Date/Time   NA 138 04/12/2014 0350   K 4.2 04/12/2014 0350   CL 101 04/12/2014 0350   CO2 25 04/12/2014 0350   GLUCOSE 220* 04/12/2014 0350   BUN 17 04/12/2014 0350   CREATININE 0.79 04/12/2014 0350   CALCIUM 8.7 04/12/2014 0350   PROT 6.9 04/12/2014 0350   ALBUMIN 2.9* 04/12/2014 0350   AST 13 04/12/2014 0350   ALT 11 04/12/2014 0350   ALKPHOS 97 04/12/2014 0350   BILITOT <0.2* 04/12/2014 0350   GFRNONAA 79* 04/12/2014 0350   GFRAA >90 04/12/2014 0350    No results found for this basename: LIPASE, AMYLASE,  in the last 168 hours No results found for this basename: AMMONIA,  in the last 168 hours  No results found for this basename: CKTOTAL, CKMB, CKMBINDEX, TROPONINI,  in the last 168 hours BNP (last 3 results) No results found for this basename: PROBNP,  in the last 8760 hours  Radiological Exams on Admission: Ct Chest Wo Contrast  04/11/2014   CLINICAL DATA:  Wheezing and shortness of breath for 3 days. Central chest pain and some nausea.  EXAM: CT CHEST WITHOUT CONTRAST  TECHNIQUE: Multidetector CT imaging of the chest was performed following the standard protocol without IV contrast.  COMPARISON:  Chest radiograph 04/11/2014  FINDINGS: Large esophageal hiatal hernia. Normal heart size. Normal caliber thoracic aorta. Esophagus is decompressed. Mediastinal lymph nodes are present at the upper limits of normal but without pathologic enlargement. These are likely reactive. No pleural effusion.  Visualization of lungs is limited due to motion artifact. There is apparent airspace disease in the right  lung base probably representing pneumonia. No pneumothorax. Airways appear patent.  Views of the included upper abdomen demonstrate cholelithiasis with multiple stones in the gallbladder. Calcification near the pancreatic tail probably represents a small splenic artery aneurysm. Degenerative changes in the thoracic spine. No destructive bone lesions.  IMPRESSION: Appearing airspace disease in the right lung base probably representing pneumonia. Large esophageal hiatal hernia. Cholelithiasis superior   Electronically Signed   By: Burman Nieves M.D.   On: 04/11/2014 21:46   Dg Chest Portable 1 View  04/11/2014   CLINICAL DATA:  Wheezing and shortness of breath. Central chest pain and nausea.  EXAM: PORTABLE CHEST - 1 VIEW  COMPARISON:  None.  FINDINGS: Heart size and pulmonary vascularity are normal. The markings are slightly accentuated by shallow inspiration. There is a focal area of increased density at the right base medially which may represent an infiltrate or atelectasis. The patient has a large hiatal hernia. No visible effusions. No  acute osseous abnormality.  IMPRESSION: Focal small area of atelectasis or infiltrate at the right lung base medially. Large hiatal hernia.   Electronically Signed   By: Geanie Cooley M.D.   On: 04/11/2014 20:15    Assessment/Plan Principal Problem:   CAP (community acquired pneumonia) Active Problems:   Mental retardation   Hypertension   Asthma   1. CAP (community acquired pneumonia) The patient is presenting with complaints of shortness of breath and cough. She's not had leukocytosis. And a CT of the chest shows possible right-sided pneumonia. Patient has extensive bilateral wheezing as well as hypoxia. With this probably etiology is a combination of community-acquired pneumonia with acute asthma exacerbation. She could also have aspiration pneumonia due to recurrent coughing. With this currently I would treat her with IV levofloxacin. I would also give her  dual nebs, Solu-Medrol 40 mg every 6 hours. I will give her oxygen as needed. If her oxygenation worsens further she will require an ABG. Mucinex and flutter device as needed as well. Check swallowing evaluation keep her n.p.o. until then  2. Developmental delay. Agitation. At present continuing her on lorazepam as needed.  3. Diabetes mellitus. Keeping her on sliding scale.  4. Hypothyroidism. Continue Synthroid.  DVT Prophylaxis: subcutaneous Heparin Nutrition: N.p.o.  Code Status: Full  Family Communication: Sister with the guardian was present at bedside, opportunity was given to ask question and all questions were answered satisfactorily at the time of interview. Disposition: Admitted to inpatient in telemetry unit.  Author: Lynden Oxford, MD [facility] Pager: [phone] 04/12/2014,    If 7PM-7AM, please contact night-coverage [email or web address] Password [email or web address]  **Disclaimer: This note may have been dictated with voice recognition software. Similar sounding words can inadvertently be transcribed and this note may contain transcription errors which may not have been corrected upon publication of note.**

## 2014-04-13 LAB — BASIC METABOLIC PANEL
Anion gap: 15 (ref 5–15)
BUN: 22 mg/dL (ref 6–23)
CALCIUM: 8.8 mg/dL (ref 8.4–10.5)
CO2: 22 mEq/L (ref 19–32)
Chloride: 105 mEq/L (ref 96–112)
Creatinine, Ser: 0.79 mg/dL (ref 0.50–1.10)
GFR calc Af Amer: >90 mL/min (ref >90)
GFR calc non Af Amer: 79 mL/min — ABNORMAL LOW (ref >90)
GLUCOSE: 147 mg/dL — AB (ref 70–99)
Potassium: 4.3 mEq/L (ref 3.7–5.3)
Sodium: 142 mEq/L (ref 137–147)

## 2014-04-13 LAB — GLUCOSE, CAPILLARY
GLUCOSE-CAPILLARY: 150 mg/dL — AB (ref 70–99)
GLUCOSE-CAPILLARY: 145 mg/dL — AB (ref 70–99)
Glucose-Capillary: 250 mg/dL — ABNORMAL HIGH (ref 70–99)
Glucose-Capillary: 166 mg/dL — ABNORMAL HIGH (ref 70–99)

## 2014-04-13 LAB — CBC
HEMATOCRIT: 35.9 % — AB (ref 36.0–46.0)
HEMOGLOBIN: 11.7 g/dL — AB (ref 12.0–15.0)
MCH: 30.4 pg (ref 26.0–34.0)
MCHC: 32.6 g/dL (ref 30.0–36.0)
MCV: 93.2 fL (ref 78.0–100.0)
Platelets: 236 10*3/uL (ref 150–400)
RBC: 3.85 MIL/uL — ABNORMAL LOW (ref 3.87–5.11)
RDW: 13.4 % (ref 11.5–15.5)
WBC: 10.6 10*3/uL — ABNORMAL HIGH (ref 4.0–10.5)

## 2014-04-13 LAB — LEGIONELLA ANTIGEN, URINE: Legionella Antigen, Urine: NEGATIVE

## 2014-04-13 LAB — STREP PNEUMONIAE URINARY ANTIGEN: STREP PNEUMO URINARY ANTIGEN: NEGATIVE

## 2014-04-13 MED ORDER — METHYLPREDNISOLONE SODIUM SUCC 125 MG IJ SOLR
60.0000 mg | Freq: Four times a day (QID) | INTRAMUSCULAR | Status: DC
  Administered 2014-04-13: 60 mg via INTRAVENOUS
  Filled 2014-04-13 (×5): qty 0.96

## 2014-04-13 MED ORDER — LEVOFLOXACIN 500 MG PO TABS
500.0000 mg | ORAL_TABLET | Freq: Every day | ORAL | Status: DC
  Administered 2014-04-13 – 2014-04-14 (×2): 500 mg via ORAL
  Filled 2014-04-13 (×3): qty 1

## 2014-04-13 MED ORDER — PREDNISONE 20 MG PO TABS
40.0000 mg | ORAL_TABLET | Freq: Every day | ORAL | Status: DC
  Administered 2014-04-13 – 2014-04-14 (×2): 40 mg via ORAL
  Filled 2014-04-13 (×4): qty 2

## 2014-04-13 NOTE — Progress Notes (Signed)
SLP Cancellation Note  Patient Details Name: Leslie Chandler MRN: [id number] DOB: Jan 04, 1935   Cancelled treatment:       Reason Eval/Treat Not Completed: Patient at procedure or test/unavailable.  SLP will follow up as able.  Fae Pippin, M.A., CCC-SLP [phone]  Leslie Chandler 04/13/2014, 10:02 AM

## 2014-04-13 NOTE — Progress Notes (Signed)
[facility] PROGRESS NOTE  Sharol Croghan ZOX:[id number] DOB: 1934/11/07 DOA: 04/11/2014 PCP: No primary provider on file.  Assessment/Plan: 1. CAP (community acquired pneumonia) with Mild Asthma exacerbation -improving, change Abx to PO -Wean O2 -Stop IV steroids and transition to Prednisone -no clear h/o Asthma but was wheezing yesterday and on admission and on albuterol PRN for wheezing -Swallow eval completed, on D3 diet with thick liquids  2. Developmental delay.  -stable, lorazepam PRN  3. Hyperglycemia due to steroids and Borderline DM -Hbaic 6.2 -SSI for now   4. Hypothyroidism.  -Continue Synthroid.   DVT Prophylaxis: subcutaneous Heparin  Code Status: Full Family Communication: d/w sister at bedside Disposition Plan:home tomorrow   HPI/Subjective: No complaints, some cough  Objective: Filed Vitals:   04/13/14 0506  BP: 131/81  Pulse: 85  Temp: 97.4 F (36.3 C)  Resp: 20    Intake/Output Summary (Last 24 hours) at 04/13/14 1050 Last data filed at 04/13/14 0920  Gross per 24 hour  Intake    360 ml  Output    650 ml  Net   -290 ml   Filed Weights   04/12/14 0021 04/12/14 0643 04/13/14 0506  Weight: 78.019 kg (172 lb) 78.019 kg (172 lb) 76.5 kg (168 lb 10.4 oz)    Exam:   General:  AAO x 1 to self only  Cardiovascular: S1S2/RRR  Respiratory: scattered ronchi  Abdomen: soft, Nt, BS present  Musculoskeletal: no edema c/c   Data Reviewed: Basic Metabolic Panel:  Recent Labs Lab 04/11/14 1952 04/12/14 0350 04/13/14 0742  NA 137 138 142  K 4.4 4.2 4.3  CL 97 101 105  CO2 GLUCOSE 296* 220* 147*  BUN CREATININE 0.90 0.79 0.79  CALCIUM 9.1 8.7 8.8   Liver Function Tests:  Recent Labs Lab 04/12/14 0350  AST 13  ALT 11  ALKPHOS 97  BILITOT <0.2*  PROT 6.9  ALBUMIN 2.9*   No results found for this basename: LIPASE, AMYLASE,  in the last 168 hours No results found for this basename: AMMONIA,  in the last  168 hours CBC:  Recent Labs Lab 04/11/14 1952 04/12/14 0350 04/13/14 0742  WBC 14.1* 12.2* 10.6*  NEUTROABS 10.5* 11.4*  --   HGB 11.5* 11.0* 11.7*  HCT 35.8* 33.5* 35.9*  MCV 94.2 91.0 93.2  PLT 243 234 236   Cardiac Enzymes: No results found for this basename: CKTOTAL, CKMB, CKMBINDEX, TROPONINI,  in the last 168 hours BNP (last 3 results) No results found for this basename: PROBNP,  in the last 8760 hours CBG:  Recent Labs Lab 04/12/14 0645 04/12/14 1139 04/12/14 1710 04/12/14 2111 04/13/14 0638  GLUCAP 195* 107* 185* 262* 150*    Recent Results (from the past 240 hour(s))  CULTURE, BLOOD (ROUTINE X 2)     Status: None   Collection Time    04/11/14 10:20 PM      Result Value Ref Range Status   Specimen Description BLOOD RIGHT HAND   Final   Special Requests NONE BOTTLES DRAWN AEROBIC AND ANAEROBIC 5CC   Final   Culture  Setup Time     Final   Value: 04/12/2014 03:23     Performed at [facility]   Culture     Final   Value:        BLOOD CULTURE RECEIVED NO GROWTH TO DATE CULTURE WILL BE HELD FOR 5 DAYS BEFORE ISSUING A FINAL NEGATIVE REPORT  Performed at [facility]   Report Status PENDING   Incomplete  CULTURE, BLOOD (ROUTINE X 2)     Status: None   Collection Time    04/11/14 10:26 PM      Result Value Ref Range Status   Specimen Description BLOOD RIGHT [facility]   Final   Special Requests BOTTLES DRAWN AEROBIC AND ANAEROBIC 5CC   Final   Culture  Setup Time     Final   Value: 04/12/2014 03:24     Performed at [facility]   Culture     Final   Value:        BLOOD CULTURE RECEIVED NO GROWTH TO DATE CULTURE WILL BE HELD FOR 5 DAYS BEFORE ISSUING A FINAL NEGATIVE REPORT     Performed at [facility]   Report Status PENDING   Incomplete     Studies: Ct Chest Wo Contrast  04/11/2014   CLINICAL DATA:  Wheezing and shortness of breath for 3 days. Central chest pain and some nausea.  EXAM: CT CHEST WITHOUT CONTRAST  TECHNIQUE:  Multidetector CT imaging of the chest was performed following the standard protocol without IV contrast.  COMPARISON:  Chest radiograph 04/11/2014  FINDINGS: Large esophageal hiatal hernia. Normal heart size. Normal caliber thoracic aorta. Esophagus is decompressed. Mediastinal lymph nodes are present at the upper limits of normal but without pathologic enlargement. These are likely reactive. No pleural effusion.  Visualization of lungs is limited due to motion artifact. There is apparent airspace disease in the right lung base probably representing pneumonia. No pneumothorax. Airways appear patent.  Views of the included upper abdomen demonstrate cholelithiasis with multiple stones in the gallbladder. Calcification near the pancreatic tail probably represents a small splenic artery aneurysm. Degenerative changes in the thoracic spine. No destructive bone lesions.  IMPRESSION: Appearing airspace disease in the right lung base probably representing pneumonia. Large esophageal hiatal hernia. Cholelithiasis superior   Electronically Signed   By: Burman Nieves M.D.   On: 04/11/2014 21:46   Dg Chest Portable 1 View  04/11/2014   CLINICAL DATA:  Wheezing and shortness of breath. Central chest pain and nausea.  EXAM: PORTABLE CHEST - 1 VIEW  COMPARISON:  None.  FINDINGS: Heart size and pulmonary vascularity are normal. The markings are slightly accentuated by shallow inspiration. There is a focal area of increased density at the right base medially which may represent an infiltrate or atelectasis. The patient has a large hiatal hernia. No visible effusions. No acute osseous abnormality.  IMPRESSION: Focal small area of atelectasis or infiltrate at the right lung base medially. Large hiatal hernia.   Electronically Signed   By: Geanie Cooley M.D.   On: 04/11/2014 20:15    Scheduled Meds: . aspirin  81 mg Oral Daily  . enoxaparin (LOVENOX) injection  40 mg Subcutaneous Q24H  . insulin aspart  0-5 Units  Subcutaneous QHS  . insulin aspart  0-9 Units Subcutaneous TID WC  . ipratropium  0.5 mg Nebulization Q6H  . lactulose  10 g Oral TID  . levalbuterol  0.63 mg Nebulization Q6H  . levofloxacin (LEVAQUIN) IV  750 mg Intravenous Q48H  . levothyroxine  88 mcg Oral QAC breakfast  . montelukast  10 mg Oral QHS  . phenytoin  125 mg Oral BID  . predniSONE  40 mg Oral Q breakfast  . raloxifene  60 mg Oral Daily   Continuous Infusions:  Antibiotics Given (last 72 hours)   Date/Time Action Medication Dose  Rate   04/12/14 0308 Given   levofloxacin (LEVAQUIN) IVPB 750 mg 750 mg 100 mL/hr      Principal Problem:   CAP (community acquired pneumonia) Active Problems:   Mental retardation   Hypertension   Asthma    Time spent:    [facility]  [facility] Pager [phone]. If 7PM-7AM, please contact night-coverage at [email or web address], password [facility] 04/13/2014, 10:50 AM  LOS: 2 days

## 2014-04-14 LAB — GLUCOSE, CAPILLARY
Glucose-Capillary: 122 mg/dL — ABNORMAL HIGH (ref 70–99)
Glucose-Capillary: 178 mg/dL — ABNORMAL HIGH (ref 70–99)

## 2014-04-14 MED ORDER — PREDNISONE 20 MG PO TABS: 40.0000 mg | ORAL_TABLET | Freq: Every day | ORAL | Status: DC

## 2014-04-14 MED ORDER — LEVOFLOXACIN 500 MG PO TABS: 500.0000 mg | ORAL_TABLET | Freq: Every day | ORAL | Status: DC

## 2014-04-14 NOTE — Discharge Summary (Signed)
Physician Discharge Summary  Leslie Chandler ZOX:[id number] DOB: 01/21/1935 DOA: 04/11/2014  PCP: No primary provider on file.  Admit date: 04/11/2014 Discharge date: 04/14/2014  Time spent: 45 minutes  Recommendations for Outpatient Follow-up:  1. PCP in 1 week 2. CXR in 4-6weeks  Discharge Diagnoses:  Principal Problem:   CAP (community acquired pneumonia) Active Problems:   Developmental delay   Mental retardation   Hypertension   Asthma   Dysphagia  Discharge Condition: stable  Diet recommendation: Dysphagia 3 diet with nectar thick liquids  Filed Weights   04/12/14 0643 04/13/14 0506 04/14/14 0419  Weight: 78.019 kg (172 lb) 76.5 kg (168 lb 10.4 oz) 75.8 kg (167 lb 1.7 oz)    History of present illness:  Leslie Chandler is a 79 y.o. female with Past medical history of developmental disorder, hypertension, asthma, osteoporosis.  The patient presented with complaints of shortness of breath with cough. The patient's history was provided by caregiver. Caregiver mentioned that since last 3-4 days the patient has been having progressively worsening cough which was nonproductive associated with progressively worsening shortness of breath. She also had an episode of choking while she was coughing.  Hospital Course:  1. CAP (community acquired pneumonia) with Mild Asthma exacerbation  -improving, treated with IV Antibiotics and steorids, now changed Abx to PO  -Weaned O2  -Stopped IV steroids and transitioned to Prednisone taper -no clear h/o Asthma but was wheezing yesterday and on admission and uses albuterol PRN for h/o wheezing  -Swallow eval completed, on D3 diet with thick liquids   2. Developmental delay.  -stable, lorazepam PRN   3. Hyperglycemia due to steroids and Borderline DM  -Hbaic 6.2  -SSI used in hospital  4. Hypothyroidism.  -Continue Synthroid   Discharge Exam: Filed Vitals:   04/14/14 0419  BP: 139/71  Pulse: 88  Temp: 98.3 F (36.8 C)  Resp: 20     General: AAOx3 Cardiovascular: S1S2/RRR Respiratory: few scattered wheezes  Discharge Instructions You were cared for by a hospitalist during your hospital stay. If you have any questions about your discharge medications or the care you received while you were in the hospital after you are discharged, you can call the unit and asked to speak with the hospitalist on call if the hospitalist that took care of you is not available. Once you are discharged, your primary care physician will handle any further medical issues. Please note that NO REFILLS for any discharge medications will be authorized once you are discharged, as it is imperative that you return to your primary care physician (or establish a relationship with a primary care physician if you do not have one) for your aftercare needs so that they can reassess your need for medications and monitor your lab values.  Discharge Instructions   Discharge instructions    Complete by:  As directed   Dysphagia 3 diet, nectar thick liquids     Increase activity slowly    Complete by:  As directed             Medication List         albuterol (2.5 MG/3ML) 0.083% nebulizer solution  Commonly known as:  PROVENTIL  Take 2.5 mg by nebulization every 6 (six) hours as needed for wheezing or shortness of breath.     albuterol 108 (90 BASE) MCG/ACT inhaler  Commonly known as:  PROVENTIL HFA;VENTOLIN HFA  Inhale into the lungs every 6 (six) hours as needed for wheezing or shortness of breath.  aspirin 81 MG chewable tablet  Chew by mouth daily.     CALCIUM & VIT D3 BONE HEALTH Liqd  Take 0.24 mLs by mouth daily.     ferrous sulfate 220 (44 FE) MG/5ML solution  Take 220 mg by mouth daily.     lactulose 10 GM/15ML solution  Commonly known as:  CHRONULAC  Take 20 g by mouth at bedtime.     levofloxacin 500 MG tablet  Commonly known as:  LEVAQUIN  Take 1 tablet (500 mg total) by mouth daily. For 5days     levothyroxine 88 MCG  tablet  Commonly known as:  SYNTHROID, LEVOTHROID  Take 88 mcg by mouth daily before breakfast.     lisinopril-hydrochlorothiazide 10-12.5 MG per tablet  Commonly known as:  PRINZIDE,ZESTORETIC  Take 1 tablet by mouth daily.     montelukast 10 MG tablet  Commonly known as:  SINGULAIR  Take 10 mg by mouth at bedtime.     phenytoin 125 MG/5ML suspension  Commonly known as:  DILANTIN  Take 125 mg by mouth 2 (two) times daily.     predniSONE 20 MG tablet  Commonly known as:  DELTASONE  Take 2 tablets (40 mg total) by mouth daily with breakfast. Take  for 2days then  for 2days then STOP     raloxifene 60 MG tablet  Commonly known as:  EVISTA  Take 60 mg by mouth daily.     VIACTIV MULTI-VITAMIN Chew  Chew 1 tablet by mouth daily.       No Known Allergies     Follow-up Information   Follow up with PCP. Schedule an appointment as soon as possible for a visit in 1 week.       The results of significant diagnostics from this hospitalization (including imaging, microbiology, ancillary and laboratory) are listed below for reference.    Significant Diagnostic Studies: Ct Chest Wo Contrast  04/11/2014   CLINICAL DATA:  Wheezing and shortness of breath for 3 days. Central chest pain and some nausea.  EXAM: CT CHEST WITHOUT CONTRAST  TECHNIQUE: Multidetector CT imaging of the chest was performed following the standard protocol without IV contrast.  COMPARISON:  Chest radiograph 04/11/2014  FINDINGS: Large esophageal hiatal hernia. Normal heart size. Normal caliber thoracic aorta. Esophagus is decompressed. Mediastinal lymph nodes are present at the upper limits of normal but without pathologic enlargement. These are likely reactive. No pleural effusion.  Visualization of lungs is limited due to motion artifact. There is apparent airspace disease in the right lung base probably representing pneumonia. No pneumothorax. Airways appear patent.  Views of the included upper abdomen  demonstrate cholelithiasis with multiple stones in the gallbladder. Calcification near the pancreatic tail probably represents a small splenic artery aneurysm. Degenerative changes in the thoracic spine. No destructive bone lesions.  IMPRESSION: Appearing airspace disease in the right lung base probably representing pneumonia. Large esophageal hiatal hernia. Cholelithiasis superior   Electronically Signed   By: Burman Nieves M.D.   On: 04/11/2014 21:46   Dg Chest Portable 1 View  04/11/2014   CLINICAL DATA:  Wheezing and shortness of breath. Central chest pain and nausea.  EXAM: PORTABLE CHEST - 1 VIEW  COMPARISON:  None.  FINDINGS: Heart size and pulmonary vascularity are normal. The markings are slightly accentuated by shallow inspiration. There is a focal area of increased density at the right base medially which may represent an infiltrate or atelectasis. The patient has a large hiatal hernia. No visible effusions. No acute  osseous abnormality.  IMPRESSION: Focal small area of atelectasis or infiltrate at the right lung base medially. Large hiatal hernia.   Electronically Signed   By: Geanie Cooley M.D.   On: 04/11/2014 20:15    Microbiology: Recent Results (from the past 240 hour(s))  CULTURE, BLOOD (ROUTINE X 2)     Status: None   Collection Time    04/11/14 10:20 PM      Result Value Ref Range Status   Specimen Description BLOOD RIGHT HAND   Final   Special Requests NONE BOTTLES DRAWN AEROBIC AND ANAEROBIC 5CC   Final   Culture  Setup Time     Final   Value: 04/12/2014 03:23     Performed at [facility]   Culture     Final   Value:        BLOOD CULTURE RECEIVED NO GROWTH TO DATE CULTURE WILL BE HELD FOR 5 DAYS BEFORE ISSUING A FINAL NEGATIVE REPORT     Performed at [facility]   Report Status PENDING   Incomplete  CULTURE, BLOOD (ROUTINE X 2)     Status: None   Collection Time    04/11/14 10:26 PM      Result Value Ref Range Status   Specimen Description BLOOD  RIGHT AC   Final   Special Requests BOTTLES DRAWN AEROBIC AND ANAEROBIC 5CC   Final   Culture  Setup Time     Final   Value: 04/12/2014 03:24     Performed at [facility]   Culture     Final   Value:        BLOOD CULTURE RECEIVED NO GROWTH TO DATE CULTURE WILL BE HELD FOR 5 DAYS BEFORE ISSUING A FINAL NEGATIVE REPORT     Performed at [facility]   Report Status PENDING   Incomplete     Labs: Basic Metabolic Panel:  Recent Labs Lab 04/11/14 1952 04/12/14 0350 04/13/14 0742  NA 137 138 142  K 4.4 4.2 4.3  CL 97 101 105  CO2 GLUCOSE 296* 220* 147*  BUN CREATININE 0.90 0.79 0.79  CALCIUM 9.1 8.7 8.8   Liver Function Tests:  Recent Labs Lab 04/12/14 0350  AST 13  ALT 11  ALKPHOS 97  BILITOT <0.2*  PROT 6.9  ALBUMIN 2.9*   No results found for this basename: LIPASE, AMYLASE,  in the last 168 hours No results found for this basename: AMMONIA,  in the last 168 hours CBC:  Recent Labs Lab 04/11/14 1952 04/12/14 0350 04/13/14 0742  WBC 14.1* 12.2* 10.6*  NEUTROABS 10.5* 11.4*  --   HGB 11.5* 11.0* 11.7*  HCT 35.8* 33.5* 35.9*  MCV 94.2 91.0 93.2  PLT 243 234 236   Cardiac Enzymes: No results found for this basename: CKTOTAL, CKMB, CKMBINDEX, TROPONINI,  in the last 168 hours BNP: BNP (last 3 results) No results found for this basename: PROBNP,  in the last 8760 hours CBG:  Recent Labs Lab 04/13/14 1036 04/13/14 1604 04/13/14 2100 04/14/14 0641 04/14/14 1102  GLUCAP 250* 166* 145* 122* 178*       Signed:  Princess Karnes  [facility] 04/14/2014, 11:20 AM

## 2014-04-14 NOTE — Progress Notes (Signed)
Patient, family, and caregiver given discharge instructions and all questions answered.  Patient discharged via wheelchair with all belongings.

## 2014-04-14 NOTE — Clinical Documentation Improvement (Signed)
Pt presents with pneumonia.   -Pt with hx of developmental disorder.    -H&P note states pt had episode of choking earlier in day. - CXR reveals infiltrates right lung base and large hiatal hernia.   -Swallow evaluation identifies moderate aspiration risk   Please clarify if you feel the patient could have: -Aspiration pneumonia -Community acquired pneumonia as documented -Other condition  Thank you, Doy Mince, RN [phone] Clinical Documentation Specialist

## 2014-04-14 NOTE — Progress Notes (Signed)
Speech Language Pathology Treatment: Dysphagia  Patient Details Name: Leslie Chandler MRN: [id number] DOB: 02-13-1935 Today's Date: 04/14/2014 Time: 7096-2836 SLP Time Calculation (min): 23 min  Assessment / Plan / Recommendation Clinical Impression  Education and observation with recommended diet provided to sister.  SLP demonstrated thickening of liquids to correct consistency and how to schedule a repeat MBS in 4-5 weeks.  Pt. With one cough that could be related to liquids or baseline cough with pna.  Educated pt.'s sister that thick liquids would not completely prevent aspiration but decrease risk and educated on the water protocol.  Sister verbalized understanding.   HPI HPI: 79 y.o. with past medical history of developmental disorder, hypertension, asthma, osteoporosis and mental retardation (cognitive developmental delay) admitted with SOB and cough.  Per MD note pt.  had an episode of choking while she was coughing today.  Treated for bronchitis one week ago.  CT 8/25 revealed airspace disease in the right lung base probably representing pneumonia. Large esophageal hiatal hernia.    Pertinent Vitals Pain Assessment: No/denies pain  SLP Plan  Discharge SLP treatment due to (comment);All goals met    Recommendations Diet recommendations: Dysphagia 3 (mechanical soft);Nectar-thick liquid Liquids provided via: Straw;Cup Medication Administration: Whole meds with puree Supervision: Patient able to self feed;Staff to assist with self feeding;Full supervision/cueing for compensatory strategies Compensations: Slow rate;Small sips/bites Postural Changes and/or Swallow Maneuvers: Seated upright 90 degrees;Out of bed for meals              Oral Care Recommendations: Oral care BID Follow up Recommendations: None Plan: Discharge SLP treatment due to (comment);All goals met    GO     Houston Siren 04/14/2014, 10:51 AM  [phone]

## 2014-04-18 LAB — CULTURE, BLOOD (ROUTINE X 2)
CULTURE: NO GROWTH
Culture: NO GROWTH

## 2014-05-15 ENCOUNTER — Other Ambulatory Visit (HOSPITAL_COMMUNITY): Payer: Self-pay | Admitting: Family Medicine

## 2014-05-15 DIAGNOSIS — R1310 Dysphagia, unspecified: Secondary | ICD-10-CM

## 2014-05-19 ENCOUNTER — Ambulatory Visit (HOSPITAL_COMMUNITY)
Admission: RE | Admit: 2014-05-19 | Discharge: 2014-05-19 | Disposition: A | Discharge status: Home / Self Care | Payer: Medicare Other | Source: Ambulatory Visit | Attending: Family Medicine | Admitting: Family Medicine

## 2014-05-19 DIAGNOSIS — I10 Essential (primary) hypertension: Secondary | ICD-10-CM | POA: Insufficient documentation

## 2014-05-19 DIAGNOSIS — R1310 Dysphagia, unspecified: Secondary | ICD-10-CM

## 2014-05-19 DIAGNOSIS — G40909 Epilepsy, unspecified, not intractable, without status epilepticus: Secondary | ICD-10-CM | POA: Insufficient documentation

## 2014-05-19 DIAGNOSIS — R6250 Unspecified lack of expected normal physiological development in childhood: Secondary | ICD-10-CM | POA: Diagnosis not present

## 2014-05-19 DIAGNOSIS — F79 Unspecified intellectual disabilities: Secondary | ICD-10-CM | POA: Diagnosis not present

## 2014-05-19 DIAGNOSIS — J45909 Unspecified asthma, uncomplicated: Secondary | ICD-10-CM | POA: Insufficient documentation

## 2014-05-19 DIAGNOSIS — R1312 Dysphagia, oropharyngeal phase: Secondary | ICD-10-CM | POA: Diagnosis not present

## 2014-05-19 NOTE — Progress Notes (Signed)
05/19/14 1100  SLP G-Codes **NOT FOR INPATIENT CLASS**  Functional Assessment Tool Used (clinical judgement)  Functional Limitations Swallowing  Swallow Current Status (W0981(G8996) CI  Swallow Goal Status (X9147(G8997) CI  Swallow Discharge Status (W2956(G8998) CI  SLP Evaluations  $ SLP Speech Visit 1 Procedure  SLP Evaluations  $Swallowing Treatment 1 Procedure  $MBS Swallow Outpatient 1 Procedure

## 2014-05-19 NOTE — Procedures (Signed)
Objective Swallowing Evaluation: Modified Barium Swallowing Study  Patient Details  Name: Leslie Chandler MRN: [id number] Date of Birth: February 01, 1935  Today's Date: 05/19/2014 Time: 1220-1254 SLP Time Calculation (min): 34 min  Past Medical History:  Past Medical History  Diagnosis Date  . Epilepsia   . Mental retardation   . Hypertension   . Asthma   . Cognitive developmental delay   . Osteoporosis    Past Surgical History: No past surgical history on file. HPI:  79 y.o. with past medical history of developmental disorder, hypertension, asthma, osteoporosis and mental retardation (cognitive developmental delay) admitted with SOB and cough in August 2015, found to have pna. MBS on 8/26 showed silent and sensed aspiraiton of thin liquids. Recommended to consume Dys3/nectar thick liquids.      Assessment / Plan / Recommendation Clinical Impression  Dysphagia Diagnosis: Mild oral phase dysphagia;Mild pharyngeal phase dysphagia Clinical impression: Pt demonstrates improved function in comparison with her prior MBS. today airway protection and epiglottic deflection are improved. Orally pt briefly holds boluses with slight premature spillage over the base of tongue in most liquid trials. There is trace flash/frank penetration without sensation in 2/10 trials of thin liquids. Overall recommend pt resume a regular texture/thin liquids diet with full supervision for safe intake, upright posture. With any medical decline or lethargy pt will have higher risk of aspiration.     Treatment Recommendation  No treatment recommended at this time    Diet Recommendation Regular;Thin liquid   Liquid Administration via: Cup;Straw Medication Administration: Whole meds with liquid Supervision: Patient able to self feed;Full supervision/cueing for compensatory strategies Compensations: Slow rate;Small sips/bites Postural Changes and/or Swallow Maneuvers: Seated upright 90 degrees;Upright 30-60 min after meal    Other  Recommendations Oral Care Recommendations: Oral care BID   Follow Up Recommendations   (no SLP f/u needed)    Frequency and Duration        Pertinent Vitals/Pain NA    SLP Swallow Goals     General HPI: 79 y.o. with past medical history of developmental disorder, hypertension, asthma, osteoporosis and mental retardation (cognitive developmental delay) admitted with SOB and cough in August 2015, found to have pna. MBS on 8/26 showed silent and sensed aspiraiton of thin liquids. Recommended to consume Dys3/nectar thick liquids.  Type of Study: Modified Barium Swallowing Study Previous Swallow Assessment: see HPI Diet Prior to this Study: Dysphagia 3 (soft);Nectar-thick liquids Temperature Spikes Noted: N/A Respiratory Status: Room air History of Recent Intubation: No Behavior/Cognition: Alert;Cooperative;Pleasant mood Oral Cavity - Dentition: Adequate natural dentition Self-Feeding Abilities: Able to feed self Patient Positioning: Upright in bed Baseline Vocal Quality: Hoarse Volitional Cough: Cognitively unable to elicit Volitional Swallow: Unable to elicit Anatomy: Within functional limits Pharyngeal Secretions: Not observed secondary MBS    Reason for Referral     Oral Phase Oral Preparation/Oral Phase Oral Phase: Impaired Oral - Nectar Oral - Nectar Cup: Delayed oral transit;Holding of bolus Oral - Thin Oral - Thin Cup: Delayed oral transit;Holding of bolus Oral - Thin Straw: Delayed oral transit;Holding of bolus Oral - Solids Oral - Puree: Delayed oral transit;Holding of bolus Oral - Regular: Within functional limits Oral - Pill: Other (Comment);Delayed oral transit (gag)   Pharyngeal Phase Pharyngeal Phase Pharyngeal Phase: Impaired Pharyngeal - Nectar Pharyngeal - Nectar Cup: Premature spillage to valleculae Pharyngeal - Thin Pharyngeal - Thin Cup: Premature spillage to valleculae;Penetration/Aspiration before swallow Penetration/Aspiration details  (thin cup): Material enters airway, CONTACTS cords then ejected out;Material enters airway, remains ABOVE vocal cords then  ejected out Pharyngeal - Thin Straw: Premature spillage to valleculae;Penetration/Aspiration before swallow Penetration/Aspiration details (thin straw): Material enters airway, remains ABOVE vocal cords then ejected out Pharyngeal - Solids Pharyngeal - Puree: Within functional limits Pharyngeal - Regular: Within functional limits Pharyngeal - Pill: Within functional limits  Cervical Esophageal Phase    GO    Cervical Esophageal Phase Cervical Esophageal Phase: [facility]        Harlon Ditty, MA CCC-SLP [phone]  Lizandro Spellman, Riley Nearing 05/19/2014, 1:30 PM

## 2014-12-07 ENCOUNTER — Other Ambulatory Visit: Payer: Self-pay

## 2014-12-07 DIAGNOSIS — Z1231 Encounter for screening mammogram for malignant neoplasm of breast: Secondary | ICD-10-CM

## 2015-01-09 ENCOUNTER — Ambulatory Visit: Payer: Medicare Other

## 2015-01-10 ENCOUNTER — Ambulatory Visit: Payer: Medicare Other

## 2015-01-12 ENCOUNTER — Ambulatory Visit
Admission: RE | Admit: 2015-01-12 | Discharge: 2015-01-12 | Disposition: A | Discharge status: Home / Self Care | Payer: Medicare Other | Source: Ambulatory Visit

## 2015-01-12 DIAGNOSIS — Z1231 Encounter for screening mammogram for malignant neoplasm of breast: Secondary | ICD-10-CM

## 2015-08-05 ENCOUNTER — Inpatient Hospital Stay (HOSPITAL_COMMUNITY)
Admission: EM | Admit: 2015-08-05 | Discharge: 2015-08-08 | DRG: 871 | Disposition: A | Discharge status: Home / Self Care | Payer: Medicare Other | Attending: Internal Medicine | Admitting: Internal Medicine

## 2015-08-05 ENCOUNTER — Emergency Department (HOSPITAL_COMMUNITY): Payer: Medicare Other

## 2015-08-05 ENCOUNTER — Encounter (HOSPITAL_COMMUNITY): Payer: Self-pay | Admitting: Emergency Medicine

## 2015-08-05 DIAGNOSIS — G459 Transient cerebral ischemic attack, unspecified: Secondary | ICD-10-CM

## 2015-08-05 DIAGNOSIS — I639 Cerebral infarction, unspecified: Secondary | ICD-10-CM

## 2015-08-05 DIAGNOSIS — E872 Acidosis, unspecified: Secondary | ICD-10-CM | POA: Diagnosis present

## 2015-08-05 DIAGNOSIS — F79 Unspecified intellectual disabilities: Secondary | ICD-10-CM

## 2015-08-05 DIAGNOSIS — Z7982 Long term (current) use of aspirin: Secondary | ICD-10-CM

## 2015-08-05 DIAGNOSIS — F70 Mild intellectual disabilities: Secondary | ICD-10-CM | POA: Diagnosis present

## 2015-08-05 DIAGNOSIS — E669 Obesity, unspecified: Secondary | ICD-10-CM | POA: Diagnosis present

## 2015-08-05 DIAGNOSIS — E039 Hypothyroidism, unspecified: Secondary | ICD-10-CM | POA: Diagnosis present

## 2015-08-05 DIAGNOSIS — Z6839 Body mass index (BMI) 39.0-39.9, adult: Secondary | ICD-10-CM | POA: Diagnosis not present

## 2015-08-05 DIAGNOSIS — J9601 Acute respiratory failure with hypoxia: Secondary | ICD-10-CM | POA: Diagnosis not present

## 2015-08-05 DIAGNOSIS — I959 Hypotension, unspecified: Secondary | ICD-10-CM

## 2015-08-05 DIAGNOSIS — Z66 Do not resuscitate: Secondary | ICD-10-CM | POA: Diagnosis not present

## 2015-08-05 DIAGNOSIS — M810 Age-related osteoporosis without current pathological fracture: Secondary | ICD-10-CM | POA: Diagnosis present

## 2015-08-05 DIAGNOSIS — Z79899 Other long term (current) drug therapy: Secondary | ICD-10-CM

## 2015-08-05 DIAGNOSIS — J45909 Unspecified asthma, uncomplicated: Secondary | ICD-10-CM | POA: Diagnosis present

## 2015-08-05 DIAGNOSIS — G40909 Epilepsy, unspecified, not intractable, without status epilepticus: Secondary | ICD-10-CM | POA: Diagnosis not present

## 2015-08-05 DIAGNOSIS — R29810 Facial weakness: Secondary | ICD-10-CM | POA: Diagnosis not present

## 2015-08-05 DIAGNOSIS — E119 Type 2 diabetes mellitus without complications: Secondary | ICD-10-CM

## 2015-08-05 DIAGNOSIS — J690 Pneumonitis due to inhalation of food and vomit: Secondary | ICD-10-CM | POA: Diagnosis present

## 2015-08-05 DIAGNOSIS — J189 Pneumonia, unspecified organism: Secondary | ICD-10-CM | POA: Diagnosis present

## 2015-08-05 DIAGNOSIS — M6289 Other specified disorders of muscle: Secondary | ICD-10-CM | POA: Diagnosis not present

## 2015-08-05 DIAGNOSIS — E875 Hyperkalemia: Secondary | ICD-10-CM | POA: Diagnosis not present

## 2015-08-05 DIAGNOSIS — A419 Sepsis, unspecified organism: Secondary | ICD-10-CM | POA: Diagnosis present

## 2015-08-05 DIAGNOSIS — R4701 Aphasia: Secondary | ICD-10-CM | POA: Diagnosis present

## 2015-08-05 DIAGNOSIS — I10 Essential (primary) hypertension: Secondary | ICD-10-CM | POA: Diagnosis present

## 2015-08-05 DIAGNOSIS — G8194 Hemiplegia, unspecified affecting left nondominant side: Secondary | ICD-10-CM | POA: Diagnosis not present

## 2015-08-05 DIAGNOSIS — R1313 Dysphagia, pharyngeal phase: Secondary | ICD-10-CM | POA: Diagnosis not present

## 2015-08-05 DIAGNOSIS — E785 Hyperlipidemia, unspecified: Secondary | ICD-10-CM | POA: Diagnosis present

## 2015-08-05 DIAGNOSIS — I5032 Chronic diastolic (congestive) heart failure: Secondary | ICD-10-CM | POA: Diagnosis present

## 2015-08-05 DIAGNOSIS — Y95 Nosocomial condition: Secondary | ICD-10-CM | POA: Diagnosis not present

## 2015-08-05 DIAGNOSIS — I5033 Acute on chronic diastolic (congestive) heart failure: Secondary | ICD-10-CM | POA: Diagnosis present

## 2015-08-05 DIAGNOSIS — R197 Diarrhea, unspecified: Secondary | ICD-10-CM | POA: Diagnosis not present

## 2015-08-05 DIAGNOSIS — Z993 Dependence on wheelchair: Secondary | ICD-10-CM

## 2015-08-05 DIAGNOSIS — R471 Dysarthria and anarthria: Secondary | ICD-10-CM | POA: Diagnosis not present

## 2015-08-05 LAB — RAPID URINE DRUG SCREEN, HOSP PERFORMED
AMPHETAMINES: NOT DETECTED
BARBITURATES: NOT DETECTED
Benzodiazepines: NOT DETECTED
COCAINE: NOT DETECTED
OPIATES: NOT DETECTED
Tetrahydrocannabinol: NOT DETECTED

## 2015-08-05 LAB — I-STAT TROPONIN, ED: Troponin i, poc: 0 ng/mL (ref 0.00–0.08)

## 2015-08-05 LAB — COMPREHENSIVE METABOLIC PANEL
ALBUMIN: 3.3 g/dL — AB (ref 3.5–5.0)
ALT: 15 U/L (ref 14–54)
ANION GAP: 14 (ref 5–15)
AST: 20 U/L (ref 15–41)
Alkaline Phosphatase: 124 U/L (ref 38–126)
BILIRUBIN TOTAL: 0.5 mg/dL (ref 0.3–1.2)
BUN: 21 mg/dL — ABNORMAL HIGH (ref 6–20)
CHLORIDE: 103 mmol/L (ref 101–111)
CO2: 22 mmol/L (ref 22–32)
Calcium: 9 mg/dL (ref 8.9–10.3)
Creatinine, Ser: 1.17 mg/dL — ABNORMAL HIGH (ref 0.44–1.00)
GFR calc Af Amer: 50 mL/min — ABNORMAL LOW (ref >60)
GFR calc non Af Amer: 43 mL/min — ABNORMAL LOW (ref >60)
GLUCOSE: 225 mg/dL — AB (ref 65–99)
POTASSIUM: 3.5 mmol/L (ref 3.5–5.1)
SODIUM: 139 mmol/L (ref 135–145)
Total Protein: 6.4 g/dL — ABNORMAL LOW (ref 6.5–8.1)

## 2015-08-05 LAB — I-STAT CHEM 8, ED
BUN: 34 mg/dL — ABNORMAL HIGH (ref 6–20)
CREATININE: 0.9 mg/dL (ref 0.44–1.00)
Calcium, Ion: 1.04 mmol/L — ABNORMAL LOW (ref 1.13–1.30)
Chloride: 103 mmol/L (ref 101–111)
GLUCOSE: 235 mg/dL — AB (ref 65–99)
HCT: 44 % (ref 36.0–46.0)
HEMOGLOBIN: 15 g/dL (ref 12.0–15.0)
POTASSIUM: 4.5 mmol/L (ref 3.5–5.1)
Sodium: 141 mmol/L (ref 135–145)
TCO2: 25 mmol/L (ref 0–100)

## 2015-08-05 LAB — URINALYSIS, ROUTINE W REFLEX MICROSCOPIC
Bilirubin Urine: NEGATIVE
GLUCOSE, UA: 100 mg/dL — AB
Hgb urine dipstick: NEGATIVE
Ketones, ur: NEGATIVE mg/dL
Leukocytes, UA: NEGATIVE
Nitrite: NEGATIVE
PH: 7 (ref 5.0–8.0)
Protein, ur: NEGATIVE mg/dL
Specific Gravity, Urine: 1.02 (ref 1.005–1.030)

## 2015-08-05 LAB — CBC
HCT: 40.9 % (ref 36.0–46.0)
Hemoglobin: 13 g/dL (ref 12.0–15.0)
MCH: 31 pg (ref 26.0–34.0)
MCHC: 31.8 g/dL (ref 30.0–36.0)
MCV: 97.4 fL (ref 78.0–100.0)
PLATELETS: 190 10*3/uL (ref 150–400)
RBC: 4.2 MIL/uL (ref 3.87–5.11)
RDW: 13.6 % (ref 11.5–15.5)
WBC: 11.2 10*3/uL — ABNORMAL HIGH (ref 4.0–10.5)

## 2015-08-05 LAB — DIFFERENTIAL
BASOS PCT: 0 %
Basophils Absolute: 0 10*3/uL (ref 0.0–0.1)
EOS ABS: 0.4 10*3/uL (ref 0.0–0.7)
Eosinophils Relative: 3 %
LYMPHS ABS: 2.2 10*3/uL (ref 0.7–4.0)
Lymphocytes Relative: 20 %
Monocytes Absolute: 0.7 10*3/uL (ref 0.1–1.0)
Monocytes Relative: 6 %
NEUTROS PCT: 71 %
Neutro Abs: 7.9 10*3/uL — ABNORMAL HIGH (ref 1.7–7.7)

## 2015-08-05 LAB — PROTIME-INR
INR: 1.06 (ref 0.00–1.49)
Prothrombin Time: 14 seconds (ref 11.6–15.2)

## 2015-08-05 LAB — I-STAT CG4 LACTIC ACID, ED: Lactic Acid, Venous: 4.97 mmol/L (ref 0.5–2.0)

## 2015-08-05 LAB — APTT: APTT: 24 s (ref 24–37)

## 2015-08-05 LAB — ETHANOL: Alcohol, Ethyl (B): <5 mg/dL (ref <5)

## 2015-08-05 LAB — POC OCCULT BLOOD, ED: Fecal Occult Bld: NEGATIVE

## 2015-08-05 LAB — PHENYTOIN LEVEL, TOTAL: Phenytoin Lvl: 16 ug/mL (ref 10.0–20.0)

## 2015-08-05 MED ORDER — SODIUM CHLORIDE 0.9 % IV BOLUS (SEPSIS)
2000.0000 mL | Freq: Once | INTRAVENOUS | Status: AC
  Administered 2015-08-05: 2000 mL via INTRAVENOUS

## 2015-08-05 MED ORDER — ASPIRIN EC 81 MG PO TBEC
81.0000 mg | DELAYED_RELEASE_TABLET | Freq: Every day | ORAL | Status: DC
  Administered 2015-08-08: 81 mg via ORAL
  Filled 2015-08-05: qty 1

## 2015-08-05 MED ORDER — HALOPERIDOL LACTATE 5 MG/ML IJ SOLN
5.0000 mg | Freq: Once | INTRAMUSCULAR | Status: AC
  Administered 2015-08-05: 5 mg via INTRAVENOUS
  Filled 2015-08-05: qty 1

## 2015-08-05 MED ORDER — PIPERACILLIN-TAZOBACTAM 3.375 G IVPB 30 MIN
3.3750 g | Freq: Once | INTRAVENOUS | Status: AC
  Administered 2015-08-05: 3.375 g via INTRAVENOUS
  Filled 2015-08-05: qty 50

## 2015-08-05 MED ORDER — IOHEXOL 350 MG/ML SOLN
80.0000 mL | Freq: Once | INTRAVENOUS | Status: AC | PRN
  Administered 2015-08-05: 80 mL via INTRAVENOUS

## 2015-08-05 MED ORDER — LORAZEPAM 2 MG/ML IJ SOLN
1.0000 mg | Freq: Once | INTRAMUSCULAR | Status: AC
  Administered 2015-08-05: 1 mg via INTRAVENOUS
  Filled 2015-08-05: qty 1

## 2015-08-05 MED ORDER — VANCOMYCIN HCL IN DEXTROSE 1-5 GM/200ML-% IV SOLN
1000.0000 mg | Freq: Once | INTRAVENOUS | Status: AC
  Administered 2015-08-05: 1000 mg via INTRAVENOUS
  Filled 2015-08-05: qty 200

## 2015-08-05 NOTE — ED Notes (Signed)
Ativan administered in MRI at 1945

## 2015-08-05 NOTE — ED Notes (Addendum)
[facility]  Pt found flaccid on L side and aphasic by caregivers.  Improved upon arrival.  [place] smearing noted around mouth, suspicious for emesis.

## 2015-08-05 NOTE — ED Notes (Signed)
Per MRI call to let them know when the antibiotics are done.

## 2015-08-05 NOTE — ED Notes (Signed)
Dr. Leroy Kennedyamilo neurologist MD at bedside.

## 2015-08-05 NOTE — ED Provider Notes (Signed)
CSN: [id number]     Arrival date & time 08/05/15  1539 History   First MD Initiated Contact with Patient 08/05/15 1540     Chief Complaint  Patient presents with  . Code Stroke     (Consider location/radiation/quality/duration/timing/severity/associated sxs/prior Treatment) HPI   Leslie Chandler is a 80 y.o F with a pmhx of MR, epilepsy, HTN, asthma who presents to the emergency department today for possible strokelike symptoms. Per home health care her last known normal was 3 PM. She went to the bathroom and home health Found her in there completely flaccid on her left side and not speaking. They became concerned for stroke and called EMS. On arrival in ED, patient is hypotensive BP 80/40. She is speaking in full sentences. She is alert and oriented, however due to patient's develop delays unable to answer most questions. Level V caveat, condition of the patient, mental retardation. Patient is able to move all extremities however, more weak on the left side. No facial droop. No pronator drift. Denies pain. Per family members who accompany her to the ED she has not been running fevers.   Past Medical History  Diagnosis Date  . Epilepsia (HCC)   . Mental retardation   . Hypertension   . Asthma   . Cognitive developmental delay   . Osteoporosis    History reviewed. No pertinent past surgical history. No family history on file. Social History  Substance Use Topics  . Smoking status: Never Smoker   . Smokeless tobacco: Never Used  . Alcohol Use: No   OB History    No data available     Review of Systems  Unable to perform ROS: Other      Allergies  Review of patient's allergies indicates no known allergies.  Home Medications   Prior to Admission medications   Medication Sig Start Date End Date Taking? Authorizing Provider  albuterol (PROVENTIL HFA;VENTOLIN HFA) 108 (90 BASE) MCG/ACT inhaler Inhale into the lungs every 6 (six) hours as needed for wheezing or shortness of  breath.    Historical Provider, MD  albuterol (PROVENTIL) (2.5 MG/3ML) 0.083% nebulizer solution Take 2.5 mg by nebulization every 6 (six) hours as needed for wheezing or shortness of breath.    Historical Provider, MD  aspirin 81 MG chewable tablet Chew by mouth daily.    Historical Provider, MD  ferrous sulfate 220 (44 FE) MG/5ML solution Take 220 mg by mouth daily.    Historical Provider, MD  lactulose (CHRONULAC) 10 GM/15ML solution Take 20 g by mouth at bedtime.    Historical Provider, MD  levothyroxine (SYNTHROID, LEVOTHROID) 88 MCG tablet Take 88 mcg by mouth daily before breakfast.    Historical Provider, MD  lisinopril-hydrochlorothiazide (PRINZIDE,ZESTORETIC) 10-12.5 MG per tablet Take 1 tablet by mouth daily.    Historical Provider, MD  montelukast (SINGULAIR) 10 MG tablet Take 10 mg by mouth at bedtime.    Historical Provider, MD  Multiple Minerals-Vitamins (CALCIUM & VIT D3 BONE HEALTH) LIQD Take 0.24 mLs by mouth daily.    Historical Provider, MD  Multiple Vitamins-Calcium (VIACTIV MULTI-VITAMIN) CHEW Chew 1 tablet by mouth daily.    Historical Provider, MD  phenytoin (DILANTIN) 125 MG/5ML suspension Take 125 mg by mouth 2 (two) times daily.    Historical Provider, MD  raloxifene (EVISTA) 60 MG tablet Take 60 mg by mouth daily.    Historical Provider, MD   Pulse 93  Temp(Src) 98.4 F (36.9 C) (Oral)  Resp 16  Wt 79.4 kg  SpO2 92% Physical Exam  Constitutional: She appears well-nourished. She appears distressed.  Ill-appearing female lying in bed. Septic appearing.  HENT:  Head: Normocephalic and atraumatic.  Mouth/Throat: No oropharyngeal exudate.  Eyes: Conjunctivae are normal. Pupils are equal, round, and reactive to light. Right eye exhibits no discharge. Left eye exhibits no discharge. No scleral icterus.  Eye gaze to the left. Unable to follow commands to evaluate EOMs.  Neck: Neck supple. No JVD present.  Cardiovascular: Normal rate, regular rhythm, normal heart sounds  and intact distal pulses.  Exam reveals no gallop and no friction rub.   No murmur heard. Pulmonary/Chest: Effort normal and breath sounds normal. No stridor. No respiratory distress. She has no wheezes. She has no rales. She exhibits no tenderness.  Abdominal: Soft. Bowel sounds are normal. She exhibits distension. There is tenderness. There is guarding. There is no rebound.  Pt grimaces with palpation of abdomen.  Genitourinary: Guaiac negative stool.  Normal rectal tone. No gross blood on rectal exam.  Musculoskeletal: Normal range of motion. She exhibits no edema.  Lymphadenopathy:    She has no cervical adenopathy.  Neurological: She is alert. She exhibits abnormal muscle tone. Coordination abnormal.  Difficult to assess sensation, as pt is not able to answer questions appropriately. DTRs intact. Pt able to move all extremities, appears weaker on the left side against resistance.  Skin: Skin is dry. No rash noted. She is not diaphoretic. No erythema. No pallor.  Distal LE are cool to touch.   Nursing note and vitals reviewed.   ED Course  Procedures (including critical care time)  CRITICAL CARE Performed by: Dub Mikes   Total critical care time: 60 minutes  Critical care time was exclusive of separately billable procedures and treating other patients.  Critical care was necessary to treat or prevent imminent or life-threatening deterioration.  Critical care was time spent personally by me on the following activities: development of treatment plan with patient and/or surrogate as well as nursing, discussions with consultants, evaluation of patient's response to treatment, examination of patient, obtaining history from patient or surrogate, ordering and performing treatments and interventions, ordering and review of laboratory studies, ordering and review of radiographic studies, pulse oximetry and re-evaluation of patient's condition.  Labs Review Labs Reviewed  CBC  - Abnormal; Notable for the following:    WBC 11.2 (*)    All other components within normal limits  DIFFERENTIAL - Abnormal; Notable for the following:    Neutro Abs 7.9 (*)    All other components within normal limits  COMPREHENSIVE METABOLIC PANEL - Abnormal; Notable for the following:    Glucose, Bld 225 (*)    BUN 21 (*)    Creatinine, Ser 1.17 (*)    Total Protein 6.4 (*)    Albumin 3.3 (*)    GFR calc non Af Amer 43 (*)    GFR calc Af Amer 50 (*)    All other components within normal limits  URINALYSIS, ROUTINE W REFLEX MICROSCOPIC (NOT AT [facility]) - Abnormal; Notable for the following:    Glucose, UA 100 (*)    All other components within normal limits  I-STAT CHEM 8, ED - Abnormal; Notable for the following:    BUN 34 (*)    Glucose, Bld 235 (*)    Calcium, Ion 1.04 (*)    All other components within normal limits  I-STAT CG4 LACTIC ACID, ED - Abnormal; Notable for the following:    Lactic Acid, Venous 4.97 (*)  All other components within normal limits  CULTURE, BLOOD (ROUTINE X 2)  CULTURE, BLOOD (ROUTINE X 2)  URINE CULTURE  ETHANOL  PROTIME-INR  APTT  URINE RAPID DRUG SCREEN, HOSP PERFORMED  PHENYTOIN LEVEL, TOTAL  I-STAT TROPOININ, ED  POC OCCULT BLOOD, ED  I-STAT CG4 LACTIC ACID, ED    Imaging Review Ct Head Wo Contrast  08/05/2015  CLINICAL DATA:  Left-sided facial weakness and difficulty talking. Code stroke. Initial encounter. EXAM: CT HEAD WITHOUT CONTRAST TECHNIQUE: Contiguous axial images were obtained from the base of the skull through the vertex without intravenous contrast. COMPARISON:  None. FINDINGS: There is no evidence of acute intracranial hemorrhage, mass lesion, brain edema or extra-axial fluid collection. The ventricles and subarachnoid spaces are mildly prominent for age, especially in the posterior fossa where there is significant cerebellar atrophy. There is no CT evidence of acute cortical infarction. There are extensive chronic small  vessel ischemic changes in the periventricular white matter, especially within the parietal lobes bilaterally. The visualized paranasal sinuses, mastoid air cells and middle ears are clear. The calvarium is intact. Possible deformity of the right condylar head versus motion artifact. IMPRESSION: 1. No evidence of acute stroke or acute intracranial hemorrhage. 2. Extensive chronic periventricular white matter disease, greatest in the parietal lobes bilaterally. 3. Severe cerebellar atrophy. 4. Critical Value/emergent results were called by telephone at the time of interpretation on 08/05/2015 at 4:30 pm to Dr. Leroy Kennedy, who verbally acknowledged these results. Electronically Signed   By: Carey Bullocks M.D.   On: 08/05/2015 16:31   Mr Brain Wo Contrast  08/05/2015  CLINICAL DATA:  Code stroke. Acute onset of LEFT hemiparesis. Decreased responsiveness. EXAM: MRI HEAD WITHOUT CONTRAST TECHNIQUE: Multiplanar, multiecho pulse sequences of the brain and surrounding structures were obtained without intravenous contrast. COMPARISON:  CT head earlier today. FINDINGS: The patient was unable to remain motionless for the exam. Small or subtle lesions could be overlooked. No evidence for acute stroke, acute hemorrhage, mass lesion, hydrocephalus, or extra-axial fluid. There is severe cerebellar atrophy and moderate cerebral atrophy. There is extensive periventricular white matter signal abnormality with cystic change, and gliosis, without chronic hemorrhage. These findings are greater in the RIGHT greater than LEFT parieto-occipital regions but also affect the white matter surrounding the frontal horns, with associated cortical volume loss. The findings are most consistent with severe periventricular leukomalacia. BILATERAL posterior fossa hygromas surround the atrophic cerebellum. No restricted diffusion to suggest acute or recent epileptiform activity. Flow voids are maintained in the carotid, basilar, and LEFT vertebral  arteries. RIGHT vertebral is diminutive. Negative orbits. No sinus or mastoid disease. Thickened calvarium, likely secondary effect of the lap. 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 These changes appear worse on the RIGHT in the supratentorial region, and some degree of chronic LEFT hemiparesis could be expected. Advanced cerebellar atrophy with ex vacuo posterior fossa hygromas. No restricted diffusion to suggest acute stroke, nor is there evidence for recent epileptiform activity. Electronically Signed   By: Elsie Stain M.D.   On: 08/05/2015 20:45   Dg Chest Portable 1 View  08/05/2015  CLINICAL DATA:  Code stroke.  Left-sided weakness and aphasia. EXAM: PORTABLE CHEST - 1 VIEW COMPARISON:  Chest x-ray 04/11/2014. FINDINGS: The heart is enlarged. A large hiatal hernia is again noted. Diffuse interstitial and airspace disease is present. Right middle lobe or lower lobe airspace disease is evident. The lung volumes  are low. IMPRESSION: 1. Cardiomegaly and mild edema. 2. Asymmetric right middle or lower lobe airspace disease concerning for pneumonia or aspiration. 3. Large hiatal hernia again noted. Electronically Signed   By: Marin Roberts M.D.   On: 08/05/2015 16:46   Ct Angio Chest Aorta W/cm &/or Wo/cm  08/05/2015  CLINICAL DATA:  Hypotension, question aortic dissection, history of hypertension and asthma EXAM: CT ANGIOGRAPHY CHEST, ABDOMEN AND PELVIS TECHNIQUE: Multidetector CT imaging through the chest, abdomen and pelvis was performed using the standard protocol during bolus administration of intravenous contrast. Multiplanar reconstructed images and MIPs were obtained and reviewed to evaluate the vascular anatomy. CONTRAST:  80mL OMNIPAQUE IOHEXOL 350 MG/ML SOLN IV COMPARISON:  CT chest without contrast 04/11/2014 FINDINGS: CTA CHEST  FINDINGS Precontrast images demonstrate normal caliber thoracic aorta with minimal scattered atherosclerotic calcification. No evidence of aneurysm or intramural hematoma like pre contrast images. Following contrast, normal aortic enhancement is seen without evidence of aortic dissection. Tortuous great vessels. Pulmonary arteries grossly patent on nondedicated exam. Large hiatal hernia. No thoracic adenopathy. Subsegmental atelectasis in both lower lobes. No pulmonary infiltrate, pleural effusion or pneumothorax. Diffuse osseous demineralization without acute osseous findings. Review of the MIP images confirms the above findings. CTA ABDOMEN AND PELVIS FINDINGS Scattered atherosclerotic calcifications of the abdominal aorta without aneurysm or dissection. Atherosclerotic plaques are seen at the origin of the celiac artery without significant narrowing. Calcified gallstones in gallbladder up to 18 mm diameter. No biliary dilatation. Liver, spleen, pancreas, kidneys, and adrenal glands appear normal. Normal appendix. Remainder of stomach unremarkable. Bowel loops grossly unremarkable. Normal appearing bladder, ureters, uterus and adnexa. No mass, adenopathy, free air, free fluid, or inflammatory process. Bones demineralized. Review of the MIP images confirms the above findings. IMPRESSION: Scattered atherosclerotic calcifications without evidence of aortic aneurysm or dissection. Subsegmental atelectasis BILATERAL lower lobes. Cholelithiasis. Electronically Signed   By: Ulyses Southward M.D.   On: 08/05/2015 17:57   Ct Cta Abd/pel W/cm &/or W/o Cm  08/05/2015  CLINICAL DATA:  Hypotension, question aortic dissection, history of hypertension and asthma EXAM: CT ANGIOGRAPHY CHEST, ABDOMEN AND PELVIS TECHNIQUE: Multidetector CT imaging through the chest, abdomen and pelvis was performed using the standard protocol during bolus administration of intravenous contrast. Multiplanar reconstructed images and MIPs were obtained  and reviewed to evaluate the vascular anatomy. CONTRAST:  80mL OMNIPAQUE IOHEXOL 350 MG/ML SOLN IV COMPARISON:  CT chest without contrast 04/11/2014 FINDINGS: CTA CHEST FINDINGS Precontrast images demonstrate normal caliber thoracic aorta with minimal scattered atherosclerotic calcification. No evidence of aneurysm or intramural hematoma like pre contrast images. Following contrast, normal aortic enhancement is seen without evidence of aortic dissection. Tortuous great vessels. Pulmonary arteries grossly patent on nondedicated exam. Large hiatal hernia. No thoracic adenopathy. Subsegmental atelectasis in both lower lobes. No pulmonary infiltrate, pleural effusion or pneumothorax. Diffuse osseous demineralization without acute osseous findings. Review of the MIP images confirms the above findings. CTA ABDOMEN AND PELVIS FINDINGS Scattered atherosclerotic calcifications of the abdominal aorta without aneurysm or dissection. Atherosclerotic plaques are seen at the origin of the celiac artery without significant narrowing. Calcified gallstones in gallbladder up to 18 mm diameter. No biliary dilatation. Liver, spleen, pancreas, kidneys, and adrenal glands appear normal. Normal appendix. Remainder of stomach unremarkable. Bowel loops grossly unremarkable. Normal appearing bladder, ureters, uterus and adnexa. No mass, adenopathy, free air, free fluid, or inflammatory process. Bones demineralized. Review of the MIP images confirms the above findings. IMPRESSION: Scattered atherosclerotic calcifications without evidence of aortic aneurysm or dissection. Subsegmental atelectasis  BILATERAL lower lobes. Cholelithiasis. Electronically Signed   By: Ulyses Southward M.D.   On: 08/05/2015 17:57   I have personally reviewed and evaluated these images and lab results as part of my medical decision-making.   EKG Interpretation   Date/Time:  Sunday August 05 2015 16:06:17 EST Ventricular Rate:  88 PR Interval:  148 QRS Duration:  89 QT Interval:  381 QTC Calculation: 461 R Axis:   18 Text Interpretation:  Sinus rhythm No significant change since last  tracing Confirmed by [facility] MD, ERIN ([id number]) on 08/05/2015 7:54:00 PM      MDM   Final diagnoses:  Hypotension    80 year old female presents with strokelike symptoms including left-sided weakness and slurred speech prior to arrival. Last known normal was at 3pm. On presentation to the ED patient is hypotensive BP 80/40. Lactic acid is 4.97. Pt is a code stroke vs code sepsis. Patient is alert in the ED however given patient's developmental delays difficult to obtain ROS and/or a good history. Patient is able to move all extremities however, appears more weak on the left side. Dr.Camilo with neurology is at patient bedside during initial exam. He discussed with me that the patient is not a candidate for TPA as her symptoms are resolving.   Patient was aggressively fluid resuscitated with 2 L. Hypotension improving. Empiric abx given, vanc and zosyn. Rectal temp normal at 97.   CT head, MRI/MRA head ordered by neurology for further stroke workup, negative for acute stroke. CT head reveals extensive chronic periventricular white matter disease. 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  MRA head still pending.   On exam, pt with very distended abdomen. Pt appears to have some discomfort on palpation of her abdomen. LE are cold. Distal pulses intact. Concern for GI bleed, vs dissection as etiology of hypotension given we cannot obtain accurate history from pt will obtain CT scan of chest/abd/pelvis to evaluate.  CT angiogram negative for dissection or acute abdomen. Hemoccult negative. WBC 11.2 with left shift. CXR reveals asymmetric right middle or lower lobe airspace disease concerning for pneumonia or  aspiration. Suspect aspiration pneumonia as the likely etiology of patient's lactic acidosis and sepsis.  On reexamination vitals are stable, BP 130/70. We'll admit to hospitalist for further evaluation and treatment. Spoke with hospitalist who will admit patient to stepdown.      Lester Kinsman [place], PA-C 08/06/15 0022  Alvira Monday, MD 08/09/15 [phone]

## 2015-08-05 NOTE — ED Notes (Signed)
Patient transported to CT 

## 2015-08-05 NOTE — ED Notes (Signed)
Per Dr. Dalene SeltzerSchlossman priority plan of care is CTA, okay to start abx after pt is back from CT and cultures obtained.

## 2015-08-05 NOTE — Consult Note (Addendum)
Referring Physician: ED    Chief Complaint: left hemiparesis, decreased responsiveness, left face weakness, dysarthria  HPI:                                                                                                                                         Leslie Chandler is an 80 y.o. female with a past medical history significant for HTN, MR with symptomatic epilepsy since childhood (on dilantin, last seizure approximately 50 years ago), osteoporosis and asthma, brought in by EMS as a code stoke due to acute onset of the above stated symptoms. Patient lives at home with home health. Sister is at the bedside and tells me that at baseline patient has language/speech impediment and is wheelchair bound. This afternoon patient went to the bathroom and was found on the toilet poorly responsive with inability to move the left side and unable to speak. EMS was called and in her way to the hospital patient became more responsive and started moving the left side.  Question if she vomit, also with low BP in the ED. Importantly, sister indicated that this is her usual speech. NIHSS 5. CT brain was independently reviewed and showed no acute abnormality. Labs reviewed: wbc 11.2, lactic acid 4.97, glucose 235, BUN 34, Cr 0.90, Na 141, K 4.5; UDS and ETOH level pending. Portable CXR concerning or pneumonia or aspiration.  Date last known well:  Time last known well:  tPA Given: no, minimal deficits, wheelchair bound NIHSS: 5 MRS: 4  Past Medical History  Diagnosis Date  . Epilepsia (HCC)   . Mental retardation   . Hypertension   . Asthma   . Cognitive developmental delay   . Osteoporosis     History reviewed. No pertinent past surgical history.  No family history on file. Social History:  reports that she has never smoked. She has never used smokeless tobacco. She reports that she does not drink alcohol or use illicit drugs. Famikly history: no MS, brain tumor, or epilepsy Allergies: No Known  Allergies  Medications:                                                                                                                           I have reviewed the patient's current medications.  ROS: unable to obtain due to MR  History obtained from sister and chart review  Physical exam:  Constitutional: well developed, pleasant female in no apparent distress. Pulse 93, temperature 98.4 F (36.9 C), temperature source Oral, resp. rate 16, weight 79.4 kg (175 lb 0.7 oz), SpO2 92 %. Eyes: no jaundice or exophthalmos.  Head: normocephalic. Neck: supple, no bruits, no JVD. Cardiac: no murmurs. Lungs: clear. Abdomen: soft, no tender, no mass. Extremities: no edema, clubbing, or cyanosis.  Skin: no rash Neurological examination General: NAD Mental Status: Alert, awake, oriented to place-year-month.  Speech dysarthric without evidence of aphasia.  Some difficulty following commands which sister said is not new. Cranial Nerves: II: Discs flat bilaterally; Visual fields grossly normal, pupils equal, round, reactive to light and accommodation III,IV, VI: ptosis not present, extra-ocular motions intact bilaterally V,VII: smile asymmetric due to mild left lower face weakness, facial light touch sensation normal bilaterally VIII: hearing normal bilaterally IX,X: uvula rises symmetrically XI: bilateral shoulder shrug XII: midline tongue extension without atrophy or fasciculations  Motor: Mild weakness left LE, although hard to establish conclusively due difficulty with comprehension/MR Tone and bulk:normal tone throughout; no atrophy noted Sensory: Pinprick and light touch diminished in the left arm-leg Deep Tendon Reflexes:  1 all over Plantars: Right: downgoing   Left: downgoing Cerebellar: normal finger-to-nose,  Couldn't perform heel-to-shin test due to  impaired comprehension Gait:  Unable to test due to multiple leads   Results for orders placed or performed during the hospital encounter of 08/05/15 (from the past 48 hour(s))  I-stat troponin, ED ([facility])     Status: None   Collection Time: 08/05/15  3:46 PM  Result Value Ref Range   Troponin i, poc 0.00 0.00 - 0.08 ng/mL   Comment 3            Comment: Due to the release kinetics of cTnI, a negative result within the first hours of the onset of symptoms does not rule out myocardial infarction with certainty. If myocardial infarction is still suspected, repeat the test at appropriate intervals.   I-Stat Chem 8, ED  ([facility])     Status: Abnormal   Collection Time: 08/05/15  3:48 PM  Result Value Ref Range   Sodium 141 135 - 145 mmol/L   Potassium 4.5 3.5 - 5.1 mmol/L   Chloride 103 101 - 111 mmol/L   BUN 34 (H) 6 - 20 mg/dL   Creatinine, Ser 9.520.90 0.44 - 1.00 mg/dL   Glucose, Bld 841235 (H) 65 - 99 mg/dL   Calcium, Ion 3.241.04 (L) 1.13 - 1.30 mmol/L   TCO2 25 0 - 100 mmol/L   Hemoglobin 15.0 12.0 - 15.0 g/dL   HCT 40.144.0 02.736.0 - 25.346.0 %  I-Stat CG4 Lactic Acid, ED     Status: Abnormal   Collection Time: 08/05/15  3:48 PM  Result Value Ref Range   Lactic Acid, Venous 4.97 (HH) 0.5 - 2.0 mmol/L   Comment NOTIFIED PHYSICIAN   Protime-INR     Status: None   Collection Time: 08/05/15  3:59 PM  Result Value Ref Range   Prothrombin Time 14.0 11.6 - 15.2 seconds   INR 1.06 0.00 - 1.49  APTT     Status: None   Collection Time: 08/05/15  3:59 PM  Result Value Ref Range   aPTT 24 24 - 37 seconds  CBC     Status: Abnormal   Collection Time: 08/05/15  3:59 PM  Result Value Ref Range   WBC 11.2 (H)  4.0 - 10.5 K/uL   RBC 4.20 3.87 - 5.11 MIL/uL   Hemoglobin 13.0 12.0 - 15.0 g/dL   HCT 16.1 09.6 - 04.5 %   MCV 97.4 78.0 - 100.0 fL   MCH 31.0 26.0 - 34.0 pg   MCHC 31.8 30.0 - 36.0 g/dL   RDW 40.9 81.1 - 91.4 %   Platelets 190 150 - 400 K/uL  Differential     Status:  Abnormal   Collection Time: 08/05/15  3:59 PM  Result Value Ref Range   Neutrophils Relative % 71 %   Neutro Abs 7.9 (H) 1.7 - 7.7 K/uL   Lymphocytes Relative 20 %   Lymphs Abs 2.2 0.7 - 4.0 K/uL   Monocytes Relative 6 %   Monocytes Absolute 0.7 0.1 - 1.0 K/uL   Eosinophils Relative 3 %   Eosinophils Absolute 0.4 0.0 - 0.7 K/uL   Basophils Relative 0 %   Basophils Absolute 0.0 0.0 - 0.1 K/uL   No results found.   Assessment: 80 y.o. female with MR and symptomatic epilepsy seizure free on dilantin for approximately 50 years, brought to the ED as a code stroke due to decreased responsiveness, left sided weakness, and dysarthria. NIHSS 5, CT brain negative for acute abnormality. Of note, sister indicated that there is not change in her speech and her motor function is significantly improved at this moment.  Some findings concerning for sepsis. Unclear to me if she had a small subcortical infarct, a seizure with transient left side paralysis, or sepsis. Needles to say, she is not a candidate for iv tPA due to minimal deficits and modified Rankin score 4. Recommend MRI brain and further stroke work up only if MRI+. Will check the dilantin level given uncertainty about current presentation being secondary to seizure . Stroke team will follow up tomorrow.     Stroke Risk Factors - age, HTN  Plan: 1. HgbA1c, fasting lipid panel 2. MRI, MRA  of the brain without contrast 3. Echocardiogram 4. Carotid dopplers 5. Prophylactic therapy-Antiplatelet med: Aspirin 81 mg daly 6. Risk factor modification 7. Telemetry monitoring 8. Frequent neuro checks 9. PT/OT SLP  Wyatt Portela, MD [facility] [phone]  08/05/2015, 4:22 PM

## 2015-08-05 NOTE — ED Notes (Signed)
Haldol administered in MRI at 2005

## 2015-08-05 NOTE — H&P (Signed)
[facility] History and Physical  Leslie DeedJoan Bann ZOX:[id number] DOB: [birth year] DOA: 08/05/2015  Referring physician: Dub MikesSamantha Tripp Dowless, PA-C PCP: Willow OraANDY,CAMILLE L, MD   Chief Complaint:  Loss of consciousness.  HPI: Leslie Chandler is a 80 y.o. female with past medical history of epilepsy, mental retardation, hypertension, asthma, osteoporosis who was brought to the emergency department via EMS after being found flaccid on her left side and aphasic as per her home health caregivers. By the time the patient arrived to the hospital, her symptoms were improved. The patient was noticed to have brown smearing around her mouth, suspicious for emesis. Per patient's sister, she was doing well in the morning, went to church with them, having lunch and then returned home. She does not remember seeing anything unusual on the patient's behavior or any symptoms.    Review of Systems:  Unable to review. The patient is sedated with lorazepam and Haldol.   Past Medical History  Diagnosis Date  . Epilepsia (HCC)   . Mental retardation   . Hypertension   . Asthma   . Cognitive developmental delay   . Osteoporosis    History reviewed. No pertinent past surgical history. Social History:  reports that she has never smoked. She has never used smokeless tobacco. She reports that she does not drink alcohol or use illicit drugs.  No Known Allergies  History reviewed. No pertinent family history.   Prior to Admission medications   Medication Sig Start Date End Date Taking? Authorizing Provider  albuterol (PROVENTIL HFA;VENTOLIN HFA) 108 (90 BASE) MCG/ACT inhaler Inhale into the lungs every 6 (six) hours as needed for wheezing or shortness of breath.   Yes Historical Provider, MD  albuterol (PROVENTIL) (2.5 MG/3ML) 0.083% nebulizer solution Take 2.5 mg by nebulization every 6 (six) hours as needed for wheezing or shortness of breath.   Yes Historical Provider, MD  aspirin 81 MG chewable tablet Chew by  mouth daily.   Yes Historical Provider, MD  ferrous sulfate 220 (44 FE) MG/5ML solution Take 220 mg by mouth daily.   Yes Historical Provider, MD  fluticasone (FLOVENT HFA) 110 MCG/ACT inhaler Inhale 2 puffs into the lungs 2 (two) times daily.   Yes Historical Provider, MD  levothyroxine (SYNTHROID, LEVOTHROID) 88 MCG tablet Take 88 mcg by mouth daily before breakfast.   Yes Historical Provider, MD  lisinopril-hydrochlorothiazide (PRINZIDE,ZESTORETIC) 10-12.5 MG per tablet Take 1 tablet by mouth daily.   Yes Historical Provider, MD  montelukast (SINGULAIR) 10 MG tablet Take 10 mg by mouth at bedtime.   Yes Historical Provider, MD  Multiple Minerals-Vitamins (CALCIUM & VIT D3 BONE HEALTH) LIQD Take 3 mLs by mouth daily.    Yes Historical Provider, MD  Multiple Vitamins-Calcium (VIACTIV MULTI-VITAMIN) CHEW Chew 1 tablet by mouth daily.   Yes Historical Provider, MD  phenytoin (DILANTIN) 125 MG/5ML suspension Take 125 mg by mouth 2 (two) times daily.   Yes Historical Provider, MD  raloxifene (EVISTA) 60 MG tablet Take 60 mg by mouth daily.   Yes Historical Provider, MD  Rectal Protectant-Emollient (HEMORRHOIDAL) OINT Place 1 application rectally 2 (two) times daily as needed (hemorrhoids).   Yes Historical Provider, MD   Physical Exam: Filed Vitals:   08/05/15 1829 08/05/15 1900 08/05/15 1915 08/05/15 2045  BP:  155/82 149/80 169/69  Pulse: 115 114 116   Temp:      TempSrc:      Resp: 18 17 13    Weight:      SpO2: 96% 94% 92%  Wt Readings from Last 3 Encounters:  08/05/15 79.4 kg (175 lb 0.7 oz)  04/14/14 75.8 kg (167 lb 1.7 oz)    General:  In no acute distress and under sedation. Eyes: PERRL, normal lids, irises & conjunctiva ENT: grossly normal hearing, lips & tongue are mildly dry Neck: no LAD, masses or thyromegaly Cardiovascular: RRR, no m/r/g. No LE edema. Telemetry: SR, no arrhythmias  Respiratory: CTA bilaterally, no w/r/r. Normal respiratory effort. Abdomen: soft,  ntnd Skin: no rash or induration seen on limited exam Musculoskeletal: Relaxed muscle tone due to sedation. Psychiatric: Sedated Neurologic: Sedated           Labs on Admission:  Basic Metabolic Panel:  Recent Labs Lab 08/05/15 1548 08/05/15 1559  NA 141 139  K 4.5 3.5  CL 103 103  CO2  --  22  GLUCOSE 235* 225*  BUN 34* 21*  CREATININE 0.90 1.17*  CALCIUM  --  9.0   Liver Function Tests:  Recent Labs Lab 08/05/15 1559  AST 20  ALT 15  ALKPHOS 124  BILITOT 0.5  PROT 6.4*  ALBUMIN 3.3*   CBC:  Recent Labs Lab 08/05/15 1548 08/05/15 1559  WBC  --  11.2*  NEUTROABS  --  7.9*  HGB 15.0 13.0  HCT 44.0 40.9  MCV  --  97.4  PLT  --  190     08/05/15 1557 Troponin i, poc 0.00 ng/mL      Radiological Exams on Admission: Ct Head Wo Contrast  08/05/2015  CLINICAL DATA:  Left-sided facial weakness and difficulty talking. Code stroke. Initial encounter. EXAM: CT HEAD WITHOUT CONTRAST TECHNIQUE: Contiguous axial images were obtained from the base of the skull through the vertex without intravenous contrast. COMPARISON:  None. FINDINGS: There is no evidence of acute intracranial hemorrhage, mass lesion, brain edema or extra-axial fluid collection. The ventricles and subarachnoid spaces are mildly prominent for age, especially in the posterior fossa where there is significant cerebellar atrophy. There is no CT evidence of acute cortical infarction. There are extensive chronic small vessel ischemic changes in the periventricular white matter, especially within the parietal lobes bilaterally. The visualized paranasal sinuses, mastoid air cells and middle ears are clear. The calvarium is intact. Possible deformity of the right condylar head versus motion artifact. IMPRESSION: 1. No evidence of acute stroke or acute intracranial hemorrhage. 2. Extensive chronic periventricular white matter disease, greatest in the parietal lobes bilaterally. 3. Severe cerebellar atrophy. 4.  Critical Value/emergent results were called by telephone at the time of interpretation on 08/05/2015 at 4:30 pm to Dr. Leroy Kennedy, who verbally acknowledged these results. Electronically Signed   By: Carey Bullocks M.D.   On: 08/05/2015 16:31   Mr Brain Wo Contrast  08/05/2015  CLINICAL DATA:  Code stroke. Acute onset of LEFT hemiparesis. Decreased responsiveness. EXAM: MRI HEAD WITHOUT CONTRAST TECHNIQUE: Multiplanar, multiecho pulse sequences of the brain and surrounding structures were obtained without intravenous contrast. COMPARISON:  CT head earlier today. FINDINGS: The patient was unable to remain motionless for the exam. Small or subtle lesions could be overlooked. No evidence for acute stroke, acute hemorrhage, mass lesion, hydrocephalus, or extra-axial fluid. There is severe cerebellar atrophy and moderate cerebral atrophy. There is extensive periventricular white matter signal abnormality with cystic change, and gliosis, without chronic hemorrhage. These findings are greater in the RIGHT greater than LEFT parieto-occipital regions but also affect the white matter surrounding the frontal horns, with associated cortical volume loss. The findings are most consistent with severe periventricular  leukomalacia. BILATERAL posterior fossa hygromas surround the atrophic cerebellum. No restricted diffusion to suggest acute or recent epileptiform activity. Flow voids are maintained in the carotid, basilar, and LEFT vertebral arteries. RIGHT vertebral is diminutive. Negative orbits. No sinus or mastoid disease. Thickened calvarium, likely secondary effect of the lap. 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 These changes appear worse on the RIGHT in the supratentorial region, and some degree of chronic LEFT hemiparesis could be expected. Advanced cerebellar atrophy  with ex vacuo posterior fossa hygromas. No restricted diffusion to suggest acute stroke, nor is there evidence for recent epileptiform activity. Electronically Signed   By: Elsie Stain M.D.   On: 08/05/2015 20:45   Dg Chest Portable 1 View  08/05/2015  CLINICAL DATA:  Code stroke.  Left-sided weakness and aphasia. EXAM: PORTABLE CHEST - 1 VIEW COMPARISON:  Chest x-ray 04/11/2014. FINDINGS: The heart is enlarged. A large hiatal hernia is again noted. Diffuse interstitial and airspace disease is present. Right middle lobe or lower lobe airspace disease is evident. The lung volumes are low. IMPRESSION: 1. Cardiomegaly and mild edema. 2. Asymmetric right middle or lower lobe airspace disease concerning for pneumonia or aspiration. 3. Large hiatal hernia again noted. Electronically Signed   By: Marin Roberts M.D.   On: 08/05/2015 16:46   Ct Angio Chest Aorta W/cm &/or Wo/cm  08/05/2015  CLINICAL DATA:  Hypotension, question aortic dissection, history of hypertension and asthma EXAM: CT ANGIOGRAPHY CHEST, ABDOMEN AND PELVIS TECHNIQUE: Multidetector CT imaging through the chest, abdomen and pelvis was performed using the standard protocol during bolus administration of intravenous contrast. Multiplanar reconstructed images and MIPs were obtained and reviewed to evaluate the vascular anatomy. CONTRAST:  80mL OMNIPAQUE IOHEXOL 350 MG/ML SOLN IV COMPARISON:  CT chest without contrast 04/11/2014 FINDINGS: CTA CHEST FINDINGS Precontrast images demonstrate normal caliber thoracic aorta with minimal scattered atherosclerotic calcification. No evidence of aneurysm or intramural hematoma like pre contrast images. Following contrast, normal aortic enhancement is seen without evidence of aortic dissection. Tortuous great vessels. Pulmonary arteries grossly patent on nondedicated exam. Large hiatal hernia. No thoracic adenopathy. Subsegmental atelectasis in both lower lobes. No pulmonary infiltrate, pleural effusion  or pneumothorax. Diffuse osseous demineralization without acute osseous findings. Review of the MIP images confirms the above findings. CTA ABDOMEN AND PELVIS FINDINGS Scattered atherosclerotic calcifications of the abdominal aorta without aneurysm or dissection. Atherosclerotic plaques are seen at the origin of the celiac artery without significant narrowing. Calcified gallstones in gallbladder up to 18 mm diameter. No biliary dilatation. Liver, spleen, pancreas, kidneys, and adrenal glands appear normal. Normal appendix. Remainder of stomach unremarkable. Bowel loops grossly unremarkable. Normal appearing bladder, ureters, uterus and adnexa. No mass, adenopathy, free air, free fluid, or inflammatory process. Bones demineralized. Review of the MIP images confirms the above findings. IMPRESSION: Scattered atherosclerotic calcifications without evidence of aortic aneurysm or dissection. Subsegmental atelectasis BILATERAL lower lobes. Cholelithiasis. Electronically Signed   By: Ulyses Southward M.D.   On: 08/05/2015 17:57   Ct Cta Abd/pel W/cm &/or W/o Cm  08/05/2015  CLINICAL DATA:  Hypotension, question aortic dissection, history of hypertension and asthma EXAM: CT ANGIOGRAPHY CHEST, ABDOMEN AND PELVIS TECHNIQUE: Multidetector CT imaging through the chest, abdomen and pelvis was performed using the standard protocol during bolus administration of intravenous contrast. Multiplanar reconstructed images and MIPs were obtained and reviewed to evaluate the vascular anatomy. CONTRAST:  80mL OMNIPAQUE IOHEXOL 350 MG/ML  SOLN IV COMPARISON:  CT chest without contrast 04/11/2014 FINDINGS: CTA CHEST FINDINGS Precontrast images demonstrate normal caliber thoracic aorta with minimal scattered atherosclerotic calcification. No evidence of aneurysm or intramural hematoma like pre contrast images. Following contrast, normal aortic enhancement is seen without evidence of aortic dissection. Tortuous great vessels. Pulmonary arteries  grossly patent on nondedicated exam. Large hiatal hernia. No thoracic adenopathy. Subsegmental atelectasis in both lower lobes. No pulmonary infiltrate, pleural effusion or pneumothorax. Diffuse osseous demineralization without acute osseous findings. Review of the MIP images confirms the above findings. CTA ABDOMEN AND PELVIS FINDINGS Scattered atherosclerotic calcifications of the abdominal aorta without aneurysm or dissection. Atherosclerotic plaques are seen at the origin of the celiac artery without significant narrowing. Calcified gallstones in gallbladder up to 18 mm diameter. No biliary dilatation. Liver, spleen, pancreas, kidneys, and adrenal glands appear normal. Normal appendix. Remainder of stomach unremarkable. Bowel loops grossly unremarkable. Normal appearing bladder, ureters, uterus and adnexa. No mass, adenopathy, free air, free fluid, or inflammatory process. Bones demineralized. Review of the MIP images confirms the above findings. IMPRESSION: Scattered atherosclerotic calcifications without evidence of aortic aneurysm or dissection. Subsegmental atelectasis BILATERAL lower lobes. Cholelithiasis. Electronically Signed   By: Ulyses Southward M.D.   On: 08/05/2015 17:57    EKG: Independently reviewed. Vent. rate 88 BPM PR interval 148 ms QRS duration 89 ms QT/QTc 381/461 ms P-R-T axes 50 18 57 Sinus rhythm   Assessment/Plan Principal Problem:   Sepsis (HCC)   HCAP (healthcare-associated pneumonia) Admit to a stepdown. Continue gentle IV hydration. Continue supplemental oxygen. Continue vancomycin per pharmacy. Continue Zosyn per pharmacy. Bronchodilators as needed. Follow-up blood cultures. Follow-up sputum culture, Gram stain and sensitivity.  Active Problems:     Mental retardation Supportive care.    Hypertension Continue lisinopril and hydrochlorothiazide. Monitor blood pressure, electrolytes, BUN and creatinine.    Asthma Continue bronchodilators as needed.     Diarrhea Check C. difficile toxin. Continue IV hydration and monitor electrolytes, BUN/creatinine.    Type 2 diabetes mellitus (HCC) CBG monitoring with regular insulin sliding scale.    Lactic acidosis Follow-up level improving with medical treatment.    Hypothyroidism Continue levothyroxine.    History of seizures Continue phenytoin.    Neurology was consulted.   Code Status: Full code. DVT Prophylaxis: Lovenox SQ. Family Communication: Her sister was present in the room. Disposition Plan: Admit to a stepdown, continue IV antibiotic therapy and sepsis workup.  Time spent: Over 70 minutes were spent during the process of this admission.  Bobette Mo [facility] Pager [phone]

## 2015-08-05 NOTE — ED Notes (Signed)
Pt unable to tolerate lying on R side, reports pain to R shoulder.  S. Dowless, PA, made aware.

## 2015-08-06 ENCOUNTER — Encounter (HOSPITAL_COMMUNITY): Payer: Self-pay | Admitting: General Practice

## 2015-08-06 DIAGNOSIS — I10 Essential (primary) hypertension: Secondary | ICD-10-CM

## 2015-08-06 DIAGNOSIS — I639 Cerebral infarction, unspecified: Secondary | ICD-10-CM

## 2015-08-06 DIAGNOSIS — F79 Unspecified intellectual disabilities: Secondary | ICD-10-CM

## 2015-08-06 DIAGNOSIS — J690 Pneumonitis due to inhalation of food and vomit: Secondary | ICD-10-CM

## 2015-08-06 LAB — CBC WITH DIFFERENTIAL/PLATELET
BASOS ABS: 0 10*3/uL (ref 0.0–0.1)
BASOS PCT: 0 %
EOS ABS: 0.1 10*3/uL (ref 0.0–0.7)
EOS PCT: 0 %
HEMATOCRIT: 37.5 % (ref 36.0–46.0)
Hemoglobin: 12.2 g/dL (ref 12.0–15.0)
Lymphocytes Relative: 8 %
Lymphs Abs: 1.1 10*3/uL (ref 0.7–4.0)
MCH: 31.8 pg (ref 26.0–34.0)
MCHC: 32.5 g/dL (ref 30.0–36.0)
MCV: 97.7 fL (ref 78.0–100.0)
MONO ABS: 1.1 10*3/uL — AB (ref 0.1–1.0)
Monocytes Relative: 8 %
NEUTROS ABS: 11.4 10*3/uL — AB (ref 1.7–7.7)
Neutrophils Relative %: 84 %
PLATELETS: 150 10*3/uL (ref 150–400)
RBC: 3.84 MIL/uL — ABNORMAL LOW (ref 3.87–5.11)
RDW: 13.9 % (ref 11.5–15.5)
WBC: 13.6 10*3/uL — ABNORMAL HIGH (ref 4.0–10.5)

## 2015-08-06 LAB — CBC
HCT: 36.7 % (ref 36.0–46.0)
Hemoglobin: 11.9 g/dL — ABNORMAL LOW (ref 12.0–15.0)
MCH: 31.6 pg (ref 26.0–34.0)
MCHC: 32.4 g/dL (ref 30.0–36.0)
MCV: 97.3 fL (ref 78.0–100.0)
PLATELETS: 152 10*3/uL (ref 150–400)
RBC: 3.77 MIL/uL — ABNORMAL LOW (ref 3.87–5.11)
RDW: 13.8 % (ref 11.5–15.5)
WBC: 12.9 10*3/uL — ABNORMAL HIGH (ref 4.0–10.5)

## 2015-08-06 LAB — URINE CULTURE: Culture: 6000

## 2015-08-06 LAB — I-STAT CG4 LACTIC ACID, ED
LACTIC ACID, VENOUS: 2.52 mmol/L — AB (ref 0.5–2.0)
LACTIC ACID, VENOUS: 2.14 mmol/L — AB (ref 0.5–2.0)

## 2015-08-06 LAB — PHOSPHORUS: PHOSPHORUS: 2.4 mg/dL — AB (ref 2.5–4.6)

## 2015-08-06 LAB — COMPREHENSIVE METABOLIC PANEL
ALBUMIN: 2.8 g/dL — AB (ref 3.5–5.0)
ALT: 13 U/L — ABNORMAL LOW (ref 14–54)
ANION GAP: 9 (ref 5–15)
AST: 19 U/L (ref 15–41)
Alkaline Phosphatase: 94 U/L (ref 38–126)
BUN: 20 mg/dL (ref 6–20)
CHLORIDE: 108 mmol/L (ref 101–111)
CO2: 22 mmol/L (ref 22–32)
Calcium: 7.7 mg/dL — ABNORMAL LOW (ref 8.9–10.3)
Creatinine, Ser: 0.97 mg/dL (ref 0.44–1.00)
GFR calc Af Amer: >60 mL/min (ref >60)
GFR calc non Af Amer: 55 mL/min — ABNORMAL LOW (ref >60)
GLUCOSE: 161 mg/dL — AB (ref 65–99)
POTASSIUM: 3.5 mmol/L (ref 3.5–5.1)
SODIUM: 139 mmol/L (ref 135–145)
Total Bilirubin: 0.3 mg/dL (ref 0.3–1.2)
Total Protein: 5.2 g/dL — ABNORMAL LOW (ref 6.5–8.1)

## 2015-08-06 LAB — C DIFFICILE QUICK SCREEN W PCR REFLEX
C DIFFICILE (CDIFF) INTERP: NEGATIVE
C DIFFICLE (CDIFF) ANTIGEN: NEGATIVE
C Diff toxin: NEGATIVE

## 2015-08-06 LAB — CREATININE, SERUM
CREATININE: 0.9 mg/dL (ref 0.44–1.00)
GFR, EST NON AFRICAN AMERICAN: 60 mL/min — AB (ref >60)

## 2015-08-06 LAB — CBG MONITORING, ED: GLUCOSE-CAPILLARY: 104 mg/dL — AB (ref 65–99)

## 2015-08-06 LAB — MAGNESIUM: Magnesium: 1.7 mg/dL (ref 1.7–2.4)

## 2015-08-06 LAB — TROPONIN I: Troponin I: <0.03 ng/mL (ref <0.031)

## 2015-08-06 MED ORDER — ONDANSETRON HCL 4 MG PO TABS
4.0000 mg | ORAL_TABLET | Freq: Four times a day (QID) | ORAL | Status: DC | PRN
  Filled 2015-08-06: qty 1

## 2015-08-06 MED ORDER — LISINOPRIL-HYDROCHLOROTHIAZIDE 10-12.5 MG PO TABS: 1.0000 | ORAL_TABLET | Freq: Every day | ORAL | Status: DC

## 2015-08-06 MED ORDER — PHENYTOIN 125 MG/5ML PO SUSP
125.0000 mg | Freq: Two times a day (BID) | ORAL | Status: DC
  Filled 2015-08-06: qty 5

## 2015-08-06 MED ORDER — RALOXIFENE HCL 60 MG PO TABS
60.0000 mg | ORAL_TABLET | Freq: Every day | ORAL | Status: DC
  Administered 2015-08-08: 60 mg via ORAL
  Filled 2015-08-06 (×4): qty 1

## 2015-08-06 MED ORDER — ALBUTEROL SULFATE (2.5 MG/3ML) 0.083% IN NEBU: 2.5000 mg | INHALATION_SOLUTION | Freq: Four times a day (QID) | RESPIRATORY_TRACT | Status: DC | PRN

## 2015-08-06 MED ORDER — BUDESONIDE 0.5 MG/2ML IN SUSP
0.5000 mg | Freq: Two times a day (BID) | RESPIRATORY_TRACT | Status: DC
  Administered 2015-08-06 – 2015-08-08 (×5): 0.5 mg via RESPIRATORY_TRACT
  Filled 2015-08-06 (×8): qty 2

## 2015-08-06 MED ORDER — POTASSIUM CHLORIDE IN NACL 20-0.9 MEQ/L-% IV SOLN
INTRAVENOUS | Status: DC
  Administered 2015-08-06: 02:00:00 via INTRAVENOUS
  Filled 2015-08-06 (×7): qty 1000

## 2015-08-06 MED ORDER — LORAZEPAM 2 MG/ML IJ SOLN
1.0000 mg | INTRAMUSCULAR | Status: DC | PRN
  Administered 2015-08-07: 1 mg via INTRAVENOUS
  Filled 2015-08-06: qty 1

## 2015-08-06 MED ORDER — VANCOMYCIN HCL IN DEXTROSE 1-5 GM/200ML-% IV SOLN
1000.0000 mg | INTRAVENOUS | Status: DC
  Administered 2015-08-06 – 2015-08-07 (×2): 1000 mg via INTRAVENOUS
  Filled 2015-08-06 (×3): qty 200

## 2015-08-06 MED ORDER — IPRATROPIUM-ALBUTEROL 0.5-2.5 (3) MG/3ML IN SOLN
3.0000 mL | Freq: Four times a day (QID) | RESPIRATORY_TRACT | Status: DC
  Administered 2015-08-06 – 2015-08-07 (×5): 3 mL via RESPIRATORY_TRACT
  Filled 2015-08-06 (×7): qty 3

## 2015-08-06 MED ORDER — ENOXAPARIN SODIUM 40 MG/0.4ML ~~LOC~~ SOLN
40.0000 mg | SUBCUTANEOUS | Status: DC
  Administered 2015-08-06 – 2015-08-08 (×3): 40 mg via SUBCUTANEOUS
  Filled 2015-08-06 (×4): qty 0.4

## 2015-08-06 MED ORDER — LISINOPRIL 10 MG PO TABS
10.0000 mg | ORAL_TABLET | Freq: Every day | ORAL | Status: DC
  Administered 2015-08-08: 10 mg via ORAL
  Filled 2015-08-06: qty 1

## 2015-08-06 MED ORDER — PNEUMOCOCCAL VAC POLYVALENT 25 MCG/0.5ML IJ INJ
0.5000 mL | INJECTION | INTRAMUSCULAR | Status: AC
  Administered 2015-08-07: 0.5 mL via INTRAMUSCULAR
  Filled 2015-08-06: qty 0.5

## 2015-08-06 MED ORDER — ONDANSETRON HCL 4 MG/2ML IJ SOLN: 4.0000 mg | Freq: Four times a day (QID) | INTRAMUSCULAR | Status: DC | PRN

## 2015-08-06 MED ORDER — LEVOTHYROXINE SODIUM 88 MCG PO TABS
88.0000 ug | ORAL_TABLET | Freq: Every day | ORAL | Status: DC
  Administered 2015-08-08: 88 ug via ORAL
  Filled 2015-08-06 (×3): qty 1

## 2015-08-06 MED ORDER — MONTELUKAST SODIUM 10 MG PO TABS
10.0000 mg | ORAL_TABLET | Freq: Every day | ORAL | Status: DC
  Administered 2015-08-07: 10 mg via ORAL
  Filled 2015-08-06 (×4): qty 1

## 2015-08-06 MED ORDER — HYDROCHLOROTHIAZIDE 12.5 MG PO CAPS
12.5000 mg | ORAL_CAPSULE | Freq: Every day | ORAL | Status: DC
  Administered 2015-08-08: 12.5 mg via ORAL
  Filled 2015-08-06: qty 1

## 2015-08-06 MED ORDER — PIPERACILLIN-TAZOBACTAM 3.375 G IVPB
3.3750 g | Freq: Three times a day (TID) | INTRAVENOUS | Status: DC
  Administered 2015-08-06 – 2015-08-08 (×7): 3.375 g via INTRAVENOUS
  Filled 2015-08-06 (×9): qty 50

## 2015-08-06 MED ORDER — FERROUS SULFATE 220 (44 FE) MG/5ML PO ELIX
220.0000 mg | ORAL_SOLUTION | Freq: Every day | ORAL | Status: DC
  Administered 2015-08-08: 220 mg via ORAL
  Filled 2015-08-06 (×3): qty 5

## 2015-08-06 MED ORDER — SODIUM CHLORIDE 0.9 % IV SOLN
125.0000 mg | Freq: Two times a day (BID) | INTRAVENOUS | Status: DC
  Administered 2015-08-06 – 2015-08-07 (×5): 125 mg via INTRAVENOUS
  Filled 2015-08-06 (×10): qty 2.5

## 2015-08-06 MED ORDER — INSULIN ASPART 100 UNIT/ML ~~LOC~~ SOLN: 0.0000 [IU] | Freq: Three times a day (TID) | SUBCUTANEOUS | Status: DC

## 2015-08-06 MED ORDER — PIPERACILLIN-TAZOBACTAM 3.375 G IVPB 30 MIN
3.3750 g | Freq: Once | INTRAVENOUS | Status: AC
  Administered 2015-08-06: 3.375 g via INTRAVENOUS
  Filled 2015-08-06: qty 50

## 2015-08-06 NOTE — Progress Notes (Signed)
PROGRESS NOTE  Leslie Chandler AVW:[id number] DOB: 07-22-1935 DOA: 08/05/2015 PCP: Willow Ora, MD  HPI/Recap of past 24 hours: Patient is a 80 year old female past oral history of mild mental retardation, epilepsy and hypertension who on 12/18 was seen okay at church in the morning, but then that evening was found at home on her side and not very responsive. Patient's sister called EMS. Patient was noted to have some brown smearing around her mass suspicious for emesis. Initially patient agitated requiring lorazepam and Haldol. Emergency room, workup suspicious for aspiration pneumonia causing sepsis. Patient's initial lactic acid level at 4.97. Given IV fluids and antibiotics and hospitalist called for further evaluation and admission.  This morning, patient was stabilized. Seen in the emergency room prior to transfer upstairs. Oxygen saturation at near 100% on 2 L nasal cannula and vital signs otherwise stable. Lactic acid level continue to trend downward with last level at 2.14. Patient herself somnolent still from Haldol and unable to interact or gave review of systems.  Assessment/Plan: Principal Problem:   Sepsis (HCC) secondary to aspiration pneumonia: Patient meets criteria given lactic acidosis, tachycardia, tachypnea and leukocytosis and pulmonary source. Treated with IV fluids, antibiotics, nebulizers, supplemental oxygen. Lactic acid level trending downward. Awaiting patient to be alert enough for speech eval. Failed basic bedside swallow eval in the emergency room, although somnolent from medication after agitation Active Problems:    Mental retardation   Hypertension: Blood pressure stable   Asthma Obesity: Patient meets criteria with BMI greater than 30   Type 2 diabetes mellitus (HCC): Patient's sister states that she is not diabetic. A1c ordered and is pending. Of note, A1c in August 2015 at 6.2    Code Status: Spoke with patient's sister who confirmed patient is a DO NOT  RESUSCITATE   Family Communication: Patient's sister at the bedside  Disposition Plan: Likely in the hospital for several days.    Consultants:  None  Procedures:  None   Antibiotics:  IV Zosyn 12/18-present  IV vancomycin 12/18-present    Objective: BP 118/83 mmHg  Pulse 89  Temp(Src) 97.8 F (36.6 C) (Oral)  Resp 18  Ht  (1.422 m)  Wt 79.4 kg (175 lb 0.7 oz)  BMI 39.27 kg/m2  SpO2 100%  Intake/Output Summary (Last 24 hours) at 08/06/15 1704 Last data filed at 08/06/15 1300  Gross per 24 hour  Intake      0 ml  Output      0 ml  Net      0 ml   Filed Weights   08/05/15 1559 08/06/15 0000  Weight: 79.4 kg (175 lb 0.7 oz) 79.4 kg (175 lb 0.7 oz)    Exam:   General:  Somnolent  Cardiovascular: Regular rate and rhythm, S1-S2   Respiratory: Decreased breath sounds bibasilar   Abdomen: Soft, obese, nontender, positive bowel sounds   Musculoskeletal: No clubbing or cyanosis, trace edema    Data Reviewed: Basic Metabolic Panel:  Recent Labs Lab 08/05/15 1548 08/05/15 1559 08/06/15 0255 08/06/15 0354  NA 141 139  --  139  K 4.5 3.5  --  3.5  CL 103 103  --  108  CO2  --  22  --  22  GLUCOSE 235* 225*  --  161*  BUN 34* 21*  --  20  CREATININE 0.90 1.17* 0.90 0.97  CALCIUM  --  9.0  --  7.7*  MG  --   --  1.7  --   PHOS  --   --  2.4*  --    Liver Function Tests:  Recent Labs Lab 08/05/15 1559 08/06/15 0354  AST 20 19  ALT 15 13*  ALKPHOS 124 94  BILITOT 0.5 0.3  PROT 6.4* 5.2*  ALBUMIN 3.3* 2.8*   No results for input(s): LIPASE, AMYLASE in the last 168 hours. No results for input(s): AMMONIA in the last 168 hours. CBC:  Recent Labs Lab 08/05/15 1548 08/05/15 1559 08/06/15 0354 08/06/15 0804  WBC  --  11.2* 13.6* 12.9*  NEUTROABS  --  7.9* 11.4*  --   HGB 15.0 13.0 12.2 11.9*  HCT 44.0 40.9 37.5 36.7  MCV  --  97.4 97.7 97.3  PLT  --  190 150 152   Cardiac Enzymes:    Recent Labs Lab 08/06/15 0255  08/06/15 0804  TROPONINI <0.03 <0.03   BNP (last 3 results) No results for input(s): BNP in the last 8760 hours.  ProBNP (last 3 results) No results for input(s): PROBNP in the last 8760 hours.  CBG:  Recent Labs Lab 08/06/15 0840  GLUCAP 104*    Recent Results (from the past 240 hour(s))  Culture, blood (routine x 2)     Status: None (Preliminary result)   Collection Time: 08/05/15  6:00 PM  Result Value Ref Range Status   Specimen Description BLOOD LEFT WRIST  Final   Special Requests BOTTLES DRAWN AEROBIC AND ANAEROBIC 5CC  Final   Culture NO GROWTH < 24 HOURS  Final   Report Status PENDING  Incomplete  Culture, blood (routine x 2)     Status: None (Preliminary result)   Collection Time: 08/05/15  6:07 PM  Result Value Ref Range Status   Specimen Description BLOOD LEFT THUMB  Final   Special Requests BOTTLES DRAWN AEROBIC ONLY 3CC  Final   Culture NO GROWTH < 24 HOURS  Final   Report Status PENDING  Incomplete  Urine culture     Status: None   Collection Time: 08/05/15  6:56 PM  Result Value Ref Range Status   Specimen Description URINE, CLEAN CATCH  Final   Special Requests NONE  Final   Culture 6,000 COLONIES/mL INSIGNIFICANT GROWTH  Final   Report Status 08/06/2015 FINAL  Final  C difficile quick scan w PCR reflex     Status: None   Collection Time: 08/06/15  2:22 AM  Result Value Ref Range Status   C Diff antigen NEGATIVE NEGATIVE Final   C Diff toxin NEGATIVE NEGATIVE Final   C Diff interpretation Negative for toxigenic C. difficile  Final     Studies: Mr Brain Wo Contrast  08/05/2015  CLINICAL DATA:  Code stroke. Acute onset of LEFT hemiparesis. Decreased responsiveness. EXAM: MRI HEAD WITHOUT CONTRAST TECHNIQUE: Multiplanar, multiecho pulse sequences of the brain and surrounding structures were obtained without intravenous contrast. COMPARISON:  CT head earlier today. FINDINGS: The patient was unable to remain motionless for the exam. Small or subtle  lesions could be overlooked. No evidence for acute stroke, acute hemorrhage, mass lesion, hydrocephalus, or extra-axial fluid. There is severe cerebellar atrophy and moderate cerebral atrophy. There is extensive periventricular white matter signal abnormality with cystic change, and gliosis, without chronic hemorrhage. These findings are greater in the RIGHT greater than LEFT parieto-occipital regions but also affect the white matter surrounding the frontal horns, with associated cortical volume loss. The findings are most consistent with severe periventricular leukomalacia. BILATERAL posterior fossa hygromas surround the atrophic cerebellum. No restricted diffusion to suggest acute or recent epileptiform activity. Flow  voids are maintained in the carotid, basilar, and LEFT vertebral arteries. RIGHT vertebral is diminutive. Negative orbits. No sinus or mastoid disease. Thickened calvarium, likely secondary effect of the lap. 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 These changes appear worse on the RIGHT in the supratentorial region, and some degree of chronic LEFT hemiparesis could be expected. Advanced cerebellar atrophy with ex vacuo posterior fossa hygromas. No restricted diffusion to suggest acute stroke, nor is there evidence for recent epileptiform activity. Electronically Signed   By: Elsie Stain M.D.   On: 08/05/2015 20:45   Ct Angio Chest Aorta W/cm &/or Wo/cm  08/05/2015  CLINICAL DATA:  Hypotension, question aortic dissection, history of hypertension and asthma EXAM: CT ANGIOGRAPHY CHEST, ABDOMEN AND PELVIS TECHNIQUE: Multidetector CT imaging through the chest, abdomen and pelvis was performed using the standard protocol during bolus administration of intravenous contrast. Multiplanar reconstructed images and MIPs were obtained and reviewed to  evaluate the vascular anatomy. CONTRAST:  80mL OMNIPAQUE IOHEXOL 350 MG/ML SOLN IV COMPARISON:  CT chest without contrast 04/11/2014 FINDINGS: CTA CHEST FINDINGS Precontrast images demonstrate normal caliber thoracic aorta with minimal scattered atherosclerotic calcification. No evidence of aneurysm or intramural hematoma like pre contrast images. Following contrast, normal aortic enhancement is seen without evidence of aortic dissection. Tortuous great vessels. Pulmonary arteries grossly patent on nondedicated exam. Large hiatal hernia. No thoracic adenopathy. Subsegmental atelectasis in both lower lobes. No pulmonary infiltrate, pleural effusion or pneumothorax. Diffuse osseous demineralization without acute osseous findings. Review of the MIP images confirms the above findings. CTA ABDOMEN AND PELVIS FINDINGS Scattered atherosclerotic calcifications of the abdominal aorta without aneurysm or dissection. Atherosclerotic plaques are seen at the origin of the celiac artery without significant narrowing. Calcified gallstones in gallbladder up to 18 mm diameter. No biliary dilatation. Liver, spleen, pancreas, kidneys, and adrenal glands appear normal. Normal appendix. Remainder of stomach unremarkable. Bowel loops grossly unremarkable. Normal appearing bladder, ureters, uterus and adnexa. No mass, adenopathy, free air, free fluid, or inflammatory process. Bones demineralized. Review of the MIP images confirms the above findings. IMPRESSION: Scattered atherosclerotic calcifications without evidence of aortic aneurysm or dissection. Subsegmental atelectasis BILATERAL lower lobes. Cholelithiasis. Electronically Signed   By: Ulyses Southward M.D.   On: 08/05/2015 17:57   Ct Cta Abd/pel W/cm &/or W/o Cm  08/05/2015  CLINICAL DATA:  Hypotension, question aortic dissection, history of hypertension and asthma EXAM: CT ANGIOGRAPHY CHEST, ABDOMEN AND PELVIS TECHNIQUE: Multidetector CT imaging through the chest, abdomen and  pelvis was performed using the standard protocol during bolus administration of intravenous contrast. Multiplanar reconstructed images and MIPs were obtained and reviewed to evaluate the vascular anatomy. CONTRAST:  80mL OMNIPAQUE IOHEXOL 350 MG/ML SOLN IV COMPARISON:  CT chest without contrast 04/11/2014 FINDINGS: CTA CHEST FINDINGS Precontrast images demonstrate normal caliber thoracic aorta with minimal scattered atherosclerotic calcification. No evidence of aneurysm or intramural hematoma like pre contrast images. Following contrast, normal aortic enhancement is seen without evidence of aortic dissection. Tortuous great vessels. Pulmonary arteries grossly patent on nondedicated exam. Large hiatal hernia. No thoracic adenopathy. Subsegmental atelectasis in both lower lobes. No pulmonary infiltrate, pleural effusion or pneumothorax. Diffuse osseous demineralization without acute osseous findings. Review of the MIP images confirms the above findings. CTA ABDOMEN AND PELVIS FINDINGS Scattered atherosclerotic calcifications of the abdominal aorta without aneurysm or dissection. Atherosclerotic plaques are seen at the origin of the celiac artery without significant narrowing. Calcified gallstones  in gallbladder up to 18 mm diameter. No biliary dilatation. Liver, spleen, pancreas, kidneys, and adrenal glands appear normal. Normal appendix. Remainder of stomach unremarkable. Bowel loops grossly unremarkable. Normal appearing bladder, ureters, uterus and adnexa. No mass, adenopathy, free air, free fluid, or inflammatory process. Bones demineralized. Review of the MIP images confirms the above findings. IMPRESSION: Scattered atherosclerotic calcifications without evidence of aortic aneurysm or dissection. Subsegmental atelectasis BILATERAL lower lobes. Cholelithiasis. Electronically Signed   By: Ulyses Southward M.D.   On: 08/05/2015 17:57    Scheduled Meds: . aspirin EC  81 mg Oral Daily  . budesonide  0.5 mg Inhalation  BID  . enoxaparin (LOVENOX) injection  40 mg Subcutaneous Q24H  . ferrous sulfate  220 mg Oral Daily  . lisinopril  10 mg Oral Daily   And  . hydrochlorothiazide  12.5 mg Oral Daily  . ipratropium-albuterol  3 mL Nebulization Q6H  . levothyroxine  88 mcg Oral QAC breakfast  . montelukast  10 mg Oral QHS  . phenytoin (DILANTIN) IV  125 mg Intravenous Q12H  . piperacillin-tazobactam (ZOSYN)  IV  3.375 g Intravenous Q8H  . [START ON 08/07/2015] pneumococcal 23 valent vaccine  0.5 mL Intramuscular Tomorrow-1000  . raloxifene  60 mg Oral Daily  . vancomycin  1,000 mg Intravenous Q24H    Continuous Infusions: . 0.9 % NaCl with KCl 20 mEq / L 100 mL/hr at 08/06/15 0142     Time spent: 25 min   Hollice Espy  [facility] Pager [phone] . If 7PM-7AM, please contact night-coverage at [email or web address], password [facility] 08/06/2015, 5:04 PM  LOS: 1 day

## 2015-08-06 NOTE — Progress Notes (Signed)
STROKE TEAM PROGRESS NOTE   HISTORY Leslie Chandler is an 80 y.o. female with a past medical history significant for HTN, MR with symptomatic epilepsy since childhood (on dilantin, last seizure approximately 50 years ago), osteoporosis and asthma, brought in by EMS as a code stoke due to acute onset of left hemiparesis, decreased responsiveness, left facial weakness, and dysarthria. Patient lives at home with home health. Sister is at the bedside and tells me that at baseline patient has language/speech impediment and is wheelchair bound. This afternoon patient went to the bathroom and was found on the toilet poorly responsive with inability to move the left side and unable to speak. EMS was called and in her way to the hospital patient became more responsive and started moving the left side.  Question if she vomit, also with low BP in the ED. Importantly, sister indicated that this is her usual speech. NIHSS 5. CT brain was independently reviewed and showed no acute abnormality. Labs reviewed: wbc 11.2, lactic acid 4.97, glucose 235, BUN 34, Cr 0.90, Na 141, K 4.5; UDS and ETOH level pending. Portable CXR concerning or pneumonia or aspiration.  Date last known well:  Time last known well:  tPA Given: no, minimal deficits, wheelchair bound NIHSS: 5 MRS: 4  SUBJECTIVE (INTERVAL HISTORY) 2 family members at the bedside. The patient is poorly responsive. She had received sedation for MRI.   OBJECTIVE Temp:  [97.8 F (36.6 C)-98.4 F (36.9 C)] 97.8 F (36.6 C) (12/18 1628) Pulse Rate:  [74-117] 80 (12/19 0800) Cardiac Rhythm:  [-]  Resp:  [12-24] 15 (12/19 0800) BP: (81-169)/(51-91) 145/68 mmHg (12/19 0800) SpO2:  [92 %-100 %] 99 % (12/19 0800) Weight:  [79.4 kg (175 lb 0.7 oz)] 79.4 kg (175 lb 0.7 oz) (12/19 0000)  CBC:  Recent Labs Lab 08/05/15 1559 08/06/15 0354 08/06/15 0804  WBC 11.2* 13.6* 12.9*  NEUTROABS 7.9* 11.4*  --   HGB 13.0 12.2 11.9*  HCT 40.9 37.5 36.7  MCV 97.4  97.7 97.3  PLT 190 150 152    Basic Metabolic Panel:  Recent Labs Lab 08/05/15 1559 08/06/15 0255 08/06/15 0354  NA 139  --  139  K 3.5  --  3.5  CL 103  --  108  CO2 22  --  22  GLUCOSE 225*  --  161*  BUN 21*  --  20  CREATININE 1.17* 0.90 0.97  CALCIUM 9.0  --  7.7*  MG  --  1.7  --   PHOS  --  2.4*  --     Lipid Panel: No results found for: CHOL, TRIG, HDL, CHOLHDL, VLDL, LDLCALC HgbA1c:  Lab Results  Component Value Date   HGBA1C 6.2* 04/12/2014   Urine Drug Screen:    Component Value Date/Time   LABOPIA NONE DETECTED 08/05/2015 1856   COCAINSCRNUR NONE DETECTED 08/05/2015 1856   LABBENZ NONE DETECTED 08/05/2015 1856   AMPHETMU NONE DETECTED 08/05/2015 1856   THCU NONE DETECTED 08/05/2015 1856   LABBARB NONE DETECTED 08/05/2015 1856      IMAGING  Ct Head Wo Contrast 08/05/2015   1. No evidence of acute stroke or acute intracranial hemorrhage.  2. Extensive chronic periventricular white matter disease, greatest in the parietal lobes bilaterally.  3. Severe cerebellar atrophy.     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 These changes appear worse on  the RIGHT in the supratentorial region, and some degree of chronic LEFT hemiparesis could be expected. Advanced cerebellar atrophy with ex vacuo posterior fossa hygromas. No restricted diffusion to suggest acute stroke, nor is there evidence for recent epileptiform activity.     Dg Chest Portable 1 View 08/05/2015   1. Cardiomegaly and mild edema.  2. Asymmetric right middle or lower lobe airspace disease concerning for pneumonia or aspiration.  3. Large hiatal hernia again noted.    Ct Angio Chest Aorta W/cm &/or Wo/cm 08/05/2015   Scattered atherosclerotic calcifications without evidence of aortic aneurysm or dissection.  Subsegmental atelectasis BILATERAL lower lobes. Cholelithiasis.   Ct Cta Abd/pel W/cm &/or W/o Cm 08/05/2015   Scattered atherosclerotic calcifications without evidence of aortic aneurysm or dissection. Subsegmental atelectasis BILATERAL lower lobes. Cholelithiasis.    PHYSICAL EXAM Elderly Caucasian lady not in distress. . Afebrile. Head is nontraumatic. Neck is supple without bruit.    Cardiac exam no murmur or gallop. Lungs are clear to auscultation. Distal pulses are well felt. Neurological Exam :  Mental Status: Alert, awake, oriented to place-year-month. Speech dysarthric without evidence of aphasia. Some difficulty following commands which sister said is not new. Cranial Nerves: II: Discs flat bilaterally; Visual fields grossly normal, pupils equal, round, reactive to light and accommodation III,IV, VI: ptosis not present, extra-ocular motions intact bilaterally V,VII: smile asymmetric due to mild left lower face weakness, facial light touch sensation normal bilaterally VIII: hearing normal bilaterally IX,X: uvula rises symmetrically XI: bilateral shoulder shrug XII: midline tongue extension without atrophy or fasciculations  Motor: Mild weakness left LE, although hard to establish conclusively due difficulty with comprehension/MR Tone and bulk:normal tone throughout; no atrophy noted Sensory: Pinprick and light touch diminished in the left arm-leg Deep Tendon Reflexes:  1 all over Plantars: Right: downgoingLeft: downgoing Cerebellar: normal finger-to-nose, Couldn't perform heel-to-shin test due to impaired comprehension Gait:  Unable to test     ASSESSMENT/PLAN [name] is a 80 y.o. female with history of HTN, MR with symptomatic epilepsy since childhood (on dilantin, last seizure approximately 50 years ago), osteoporosis and asthma presenting with left hemiparesis, decreased responsiveness, left facial weakness, and dysarthria.   She did not receive IV t-PA due to minimal deficits.  No definite stroke.  Resultant  mild dysarthria  MRI  - No restricted diffusion to suggest acute stroke  MRA - pending  Carotid Doppler  pending  2D Echo  pending  LDL not performed.   HgbA1c pending  VTE prophylaxis - Lovenox  Diet heart healthy/carb modified Room service appropriate?: Yes; Fluid consistency:: Thin  aspirin 81 mg daily prior to admission, now on aspirin 81 mg daily  Patient counseled to be compliant with her antithrombotic medications  Ongoing aggressive stroke risk factor management  Therapy recommendations:  Pending  Disposition: Pending  Hypertension  Blood pressure tends to run low at times Permissive hypertension (OK if < 220/120) but gradually normalize in 5-7 days  Hyperlipidemia  Home meds:  No lipid lowering medications prior to admission  LDL not performed   Other Stroke Risk Factors  Advanced age  Obesity, Body mass index is 39.27 kg/(m^2).    Other Active Problems  Seizure disorder  Hospital day # 1  Delton See PA-C [facility] Pager [phone] 08/06/2015, 7:10 PM I have personally examined this patient, reviewed notes, independently viewed imaging studies, participated in medical decision making and plan of care. I have made any additions or clarifications directly to the above note. Agree  with note above. She has significant for mental retardation and remote history of epilepsy and baseline but presented with decreased responsiveness left facial weakness and dysarthria likely due to a right brain TIA. She remains at risk for neurological worsening, recurrent strokes, TIA, seizures and needs ongoing evaluation and this factor modification. I had a long discussion with the patient's family and friend at the bedside and answered questions  Delia Heady, MD Medical Director [facility] Pager: [phone] 08/06/2015 9:00 PM     To  contact Stroke Continuity provider, please refer to [email or web address]. After hours, contact General Neurology

## 2015-08-06 NOTE — ED Notes (Signed)
Pt changed and new linen placed on bed. Pt remains NPO at this time. SLP ordered by previous Charity fundraiserN. Pt remains on cardiac monitor, vital signs stable. Skin warm and dry. No distress noted. Family now at the bedside

## 2015-08-06 NOTE — Progress Notes (Signed)
ANTIBIOTIC CONSULT NOTE - INITIAL  Pharmacy Consult for vancomycin Indication: rule out pneumonia and rule out sepsis  No Known Allergies  Patient Measurements: Height: 4\' 8"  (142.2 cm) Weight: 175 lb 0.7 oz (79.4 kg) IBW/kg (Calculated) : 36.3  Vital Signs: Temp: 97.8 F (36.6 C) (12/18 1628) Temp Source: Rectal (12/18 1628) BP: 107/57 mmHg (12/18 2330) Pulse Rate: 95 (12/18 2330)  Labs:  Recent Labs  08/05/15 1548 08/05/15 1559  WBC  --  11.2*  HGB 15.0 13.0  PLT  --  190  CREATININE 0.90 1.17*   Estimated Creatinine Clearance: 33.5 mL/min (by C-G formula based on Cr of 1.17).  Medical History: Past Medical History  Diagnosis Date  . Epilepsia (HCC)   . Mental retardation   . Hypertension   . Asthma   . Cognitive developmental delay   . Osteoporosis     Assessment: 80yo female presented to ED as code stroke though family states pt is at baseline, MRI/MRA pending, DDx includes sepsis given hypotension (improving w/ fluid resuscitation), CXR concerning for PNA or aspiration, to begin IV ABX.  Goal of Therapy:  Vancomycin trough level 15-20 mcg/ml  Plan:  Rec'd vanc 1g in ED; will continue with vancomycin 1000mg  IV Q24H and monitor CBC, Cx, levels prn.  Vernard GamblesVeronda Bryk, PharmD, BCPS  08/06/2015,12:37 AM

## 2015-08-06 NOTE — ED Notes (Signed)
Cbg was 104, Nurse was informed.

## 2015-08-06 NOTE — Care Management Note (Signed)
Case Management Note  Patient Details  Name: Leslie Chandler MRN: [id number] Date of Birth: August 01, 1935  Subjective/Objective:                  Date-08-06-15 Initial Assessment Spoke with patient at the bedside along with personal HHA.  Introduced self as Sports coachcase manager and explained role in discharge planning and how to be reached.  Verified patient lives at [facility].  Verified patient anticipates to go home to Group Home at time of discharge.  Patient has DME wheelchair. Expressed potential need for no other DME.  Patient denied  needing help with their medication.  Patient is driven by staff to MD appointments.  Verified patient has PCP Patient states they currently receive HH services through no one.    Plan: CM will continue to [facility].   Lawerance SabalDebbie Samiksha Pellicano RN BSN CM [phone]   Action/Plan:  No further CM needs identified at this time, will continue to follow.  Expected Discharge Date:                  Expected Discharge Plan:  Group Home  In-House Referral:  Clinical Social Work  Discharge planning Services  CM Consult  Post Acute Care Choice:    Choice offered to:     DME Arranged:    DME Agency:     HH Arranged:    HH Agency:     Status of Service:  In process, will continue to follow  Medicare Important Message Given:    Date Medicare IM Given:    Medicare IM give by:    Date Additional Medicare IM Given:    Additional Medicare Important Message give by:     If discussed at Long Length of Stay Meetings, dates discussed:    Additional Comments:  Lawerance SabalDebbie Edan Serratore, RN 08/06/2015, 2:53 PM

## 2015-08-06 NOTE — Progress Notes (Signed)
NURSING PROGRESS NOTE  Leslie Chandler [id number] Admission Data: 08/06/2015 2:18 PM Attending Provider: Hollice EspySendil K Krishnan, MD WUJ:WJXB,JYNWGNFPCP:ANDY,CAMILLE L, MD Code Status: DNR  Leslie Chandler is a 80 y.o. female patient admitted from ED:  -No acute distress noted.  -No complaints of shortness of breath.  -No complaints of chest pain.   Cardiac Monitoring: Box # 23 in place. Cardiac monitor yields:normal sinus rhythm.  Blood pressure 118/83, pulse 89, temperature 97.8 F (36.6 C), temperature source Oral, resp. rate 18, height 4\' 8"  (1.422 m), weight 79.4 kg (175 lb 0.7 oz), SpO2 100 %.   IV Fluids:  IV in place X2. Both bleeding. IV's removed. IV team consulted. Allergies:  Review of patient's allergies indicates no known allergies.  Past Medical History:   has a past medical history of Epilepsia (HCC); Mental retardation; Hypertension; Asthma; Cognitive developmental delay; and Osteoporosis.  Past Surgical History:   has past surgical history that includes Breast reconstruction and Dental surgery.  Social History:   reports that she has never smoked. She has never used smokeless tobacco. She reports that she does not drink alcohol or use illicit drugs.  Skin: Hematoma to left anterior shoulder. Purple, circular bruise noted to right flank.   Patient/Family orientated to room. Pt has MR. Sister in room with pt; guardian.  Information packet given to patient/family. Admission inpatient armband information verified with patient/family to include name and date of birth and placed on patient arm. Side rails up x 2, fall assessment and education completed with patient/family. Patient/family able to verbalize understanding of risk associated with falls and verbalized understanding to call for assistance before getting out of bed. Call light within reach. Patient/family able to voice and demonstrate understanding of unit orientation instructions.    Will continue to evaluate and treat per MD orders.  [name]  Neaveh Belanger, RN

## 2015-08-06 NOTE — ED Notes (Signed)
Pt alert and talking talking to staff now.

## 2015-08-06 NOTE — ED Notes (Signed)
Notified by lab that CBC is clotted. Will need to be redrawn.

## 2015-08-06 NOTE — Progress Notes (Signed)
SLP Cancellation Note  Patient Details Name: Leslie Chandler MRN: [id number] DOB: 1935/06/08   Cancelled treatment:       Reason Eval/Treat Not Completed: Fatigue/lethargy limiting ability to participate.  The patient was given haldol for a test and is currently lethargic.  ST will continue efforts to complete BSE.  Of note, the patient has had 2 MBS's.  First one 04/12/2014 with recommendation for dysphagia 3 with nectar thick liquids.  Second one dated 05/19/2014 which advanced her diet to regular with thin liquids.    Dimas AguasMelissa Sharmel Ballantine, MA, CCC-SLP Acute Rehab SLP [phone] Fleet ContrasGoodman, Evertt Chouinard N 08/06/2015, 11:31 AM

## 2015-08-06 NOTE — ED Notes (Signed)
Admitting Provider at the bedside.  

## 2015-08-07 ENCOUNTER — Inpatient Hospital Stay (HOSPITAL_COMMUNITY): Payer: Medicare Other

## 2015-08-07 DIAGNOSIS — E872 Acidosis: Secondary | ICD-10-CM

## 2015-08-07 DIAGNOSIS — G459 Transient cerebral ischemic attack, unspecified: Secondary | ICD-10-CM

## 2015-08-07 DIAGNOSIS — A419 Sepsis, unspecified organism: Secondary | ICD-10-CM | POA: Diagnosis not present

## 2015-08-07 LAB — CBC WITH DIFFERENTIAL/PLATELET
BASOS PCT: 2 %
Basophils Absolute: 0.2 10*3/uL — ABNORMAL HIGH (ref 0.0–0.1)
EOS ABS: 0.4 10*3/uL (ref 0.0–0.7)
EOS PCT: 3 %
HCT: 35.5 % — ABNORMAL LOW (ref 36.0–46.0)
Hemoglobin: 12.1 g/dL (ref 12.0–15.0)
LYMPHS ABS: 2.5 10*3/uL (ref 0.7–4.0)
Lymphocytes Relative: 20 %
MCH: 32.9 pg (ref 26.0–34.0)
MCHC: 34.1 g/dL (ref 30.0–36.0)
MCV: 96.5 fL (ref 78.0–100.0)
MONO ABS: 1.6 10*3/uL — AB (ref 0.1–1.0)
Monocytes Relative: 13 %
Neutro Abs: 7.6 10*3/uL (ref 1.7–7.7)
Neutrophils Relative %: 62 %
PLATELETS: ADEQUATE 10*3/uL (ref 150–400)
RBC: 3.68 MIL/uL — ABNORMAL LOW (ref 3.87–5.11)
RDW: 14.1 % (ref 11.5–15.5)
Smear Review: ADEQUATE
WBC: 12.3 10*3/uL — AB (ref 4.0–10.5)

## 2015-08-07 LAB — HEMOGLOBIN A1C
Hgb A1c MFr Bld: 6.5 % — ABNORMAL HIGH (ref 4.8–5.6)
MEAN PLASMA GLUCOSE: 140 mg/dL

## 2015-08-07 LAB — COMPREHENSIVE METABOLIC PANEL
ALT: 10 U/L — ABNORMAL LOW (ref 14–54)
ANION GAP: 10 (ref 5–15)
AST: 36 U/L (ref 15–41)
Albumin: 2.6 g/dL — ABNORMAL LOW (ref 3.5–5.0)
Alkaline Phosphatase: 86 U/L (ref 38–126)
BUN: 15 mg/dL (ref 6–20)
CHLORIDE: 112 mmol/L — AB (ref 101–111)
CO2: 21 mmol/L — ABNORMAL LOW (ref 22–32)
CREATININE: 0.88 mg/dL (ref 0.44–1.00)
Calcium: 8 mg/dL — ABNORMAL LOW (ref 8.9–10.3)
Glucose, Bld: 102 mg/dL — ABNORMAL HIGH (ref 65–99)
POTASSIUM: 5.1 mmol/L (ref 3.5–5.1)
Sodium: 143 mmol/L (ref 135–145)
Total Bilirubin: 1.1 mg/dL (ref 0.3–1.2)
Total Protein: 5.3 g/dL — ABNORMAL LOW (ref 6.5–8.1)

## 2015-08-07 LAB — LACTIC ACID, PLASMA: LACTIC ACID, VENOUS: 1.4 mmol/L (ref 0.5–2.0)

## 2015-08-07 NOTE — Progress Notes (Signed)
SLP Cancellation Note  Patient Details Name: Alleyah Cartlidge MRN: [id number] [id number] DOB: [birth year]   Cancelled treatment:       Reason Eval/Treat Not Completed: Fatigue/lethargy limiting ability to participate;Patient's level of consciousness.  Medications are impacting patient's ability to arouse and participate in the bedside swallow assessment.  RN made aware.  SLP will follow up as able.    Fae PippinMelissa Cleo Santucci, M.A., CCC-SLP [phone]  Brysyn Brandenberger 08/07/2015, 9:05 AM

## 2015-08-07 NOTE — Progress Notes (Signed)
*  PRELIMINARY RESULTS* Vascular Ultrasound Carotid Duplex (Doppler) has been completed.  Preliminary findings: Bilateral: No significant (1-39%) ICA stenosis. Antegrade vertebral flow.    Farrel DemarkJill Eunice, RDMS, RVT  08/07/2015, 3:43 PM

## 2015-08-07 NOTE — Progress Notes (Signed)
STROKE TEAM PROGRESS NOTE   HISTORY Leslie Chandler is an 80 y.o. female with a past medical history significant for HTN, MR with symptomatic epilepsy since childhood (on dilantin, last seizure approximately 50 years ago), osteoporosis and asthma, brought in by EMS as a code stoke due to acute onset of left hemiparesis, decreased responsiveness, left facial weakness, and dysarthria. Patient lives at home with home health. Sister is at the bedside and tells me that at baseline patient has language/speech impediment and is wheelchair bound. This afternoon patient went to the bathroom and was found on the toilet poorly responsive with inability to move the left side and unable to speak. EMS was called and in her way to the hospital patient became more responsive and started moving the left side.  Question if she vomit, also with low BP in the ED. Importantly, sister indicated that this is her usual speech. NIHSS 5. CT brain was independently reviewed and showed no acute abnormality. Labs reviewed: wbc 11.2, lactic acid 4.97, glucose 235, BUN 34, Cr 0.90, Na 141, K 4.5; UDS and ETOH level pending. Portable CXR concerning or pneumonia or aspiration.  Date last known well:  Time last known well:  tPA Given: no, minimal deficits, wheelchair bound NIHSS: 5 MRS: 4  SUBJECTIVE (INTERVAL HISTORY) 2 family members at the bedside. The patient had received sedation for lab work.   OBJECTIVE Temp:  [97.7 F (36.5 C)-97.8 F (36.6 C)] 97.8 F (36.6 C) (12/20 0644) Pulse Rate:  [64-109] 64 (12/20 0644) Cardiac Rhythm:  [-] Normal sinus rhythm (12/20 0705) Resp:  [16-20] 16 (12/20 0644) BP: (119-166)/(49-90) 119/49 mmHg (12/20 0644) SpO2:  [91 %-95 %] 91 % (12/20 1352)  CBC:   Recent Labs Lab 08/06/15 0354 08/06/15 0804 08/07/15 0825  WBC 13.6* 12.9* 12.3*  NEUTROABS 11.4*  --  7.6  HGB 12.2 11.9* 12.1  HCT 37.5 36.7 35.5*  MCV 97.7 97.3 96.5  PLT 150 152 PLATELET CLUMPS NOTED ON SMEAR,  COUNT APPEARS ADEQUATE    Basic Metabolic Panel:   Recent Labs Lab 08/06/15 0255 08/06/15 0354 08/07/15 0825  NA  --  139 143  K  --  3.5 5.1  CL  --  108 112*  CO2  --  22 21*  GLUCOSE  --  161* 102*  BUN  --  20 15  CREATININE 0.90 0.97 0.88  CALCIUM  --  7.7* 8.0*  MG 1.7  --   --   PHOS 2.4*  --   --     Lipid Panel: No results found for: CHOL, TRIG, HDL, CHOLHDL, VLDL, LDLCALC HgbA1c:  Lab Results  Component Value Date   HGBA1C 6.5* 08/06/2015   Urine Drug Screen:     Component Value Date/Time   LABOPIA NONE DETECTED 08/05/2015 1856   COCAINSCRNUR NONE DETECTED 08/05/2015 1856   LABBENZ NONE DETECTED 08/05/2015 1856   AMPHETMU NONE DETECTED 08/05/2015 1856   THCU NONE DETECTED 08/05/2015 1856   LABBARB NONE DETECTED 08/05/2015 1856      IMAGING  Ct Head Wo Contrast 08/05/2015   1. No evidence of acute stroke or acute intracranial hemorrhage.  2. Extensive chronic periventricular white matter disease, greatest in the parietal lobes bilaterally.  3. Severe cerebellar atrophy.     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 These changes appear worse on the RIGHT in the supratentorial  region, and some degree of chronic LEFT hemiparesis could be expected. Advanced cerebellar atrophy with ex vacuo posterior fossa hygromas. No restricted diffusion to suggest acute stroke, nor is there evidence for recent epileptiform activity.     Dg Chest Portable 1 View 08/05/2015   1. Cardiomegaly and mild edema.  2. Asymmetric right middle or lower lobe airspace disease concerning for pneumonia or aspiration.  3. Large hiatal hernia again noted.    Ct Angio Chest Aorta W/cm &/or Wo/cm 08/05/2015   Scattered atherosclerotic calcifications without evidence of aortic aneurysm or dissection.  Subsegmental atelectasis BILATERAL lower lobes. Cholelithiasis.   Ct Cta Abd/pel W/cm &/or W/o Cm 08/05/2015   Scattered atherosclerotic calcifications without evidence of aortic aneurysm or dissection. Subsegmental atelectasis BILATERAL lower lobes. Cholelithiasis.    PHYSICAL EXAM Elderly Caucasian lady not in distress. . Afebrile. Head is nontraumatic. Neck is supple without bruit.    Cardiac exam no murmur or gallop. Lungs are clear to auscultation. Distal pulses are well felt. Neurological Exam :  Mental Status: Drowsy barely responsive Speech dysarthric without evidence of aphasia. Some difficulty following commands which sister said is not new. Cranial Nerves: II: Discs flat bilaterally; Visual fields grossly normal, pupils equal, round, reactive to light and accommodation III,IV, VI: ptosis not present, extra-ocular motions intact bilaterally V,VII: smile asymmetric due to mild left lower face weakness, facial light touch sensation normal bilaterally VIII: hearing normal bilaterally IX,X: uvula rises symmetrically XI: bilateral shoulder shrug XII: midline tongue extension without atrophy or fasciculations  Motor: Mild weakness left LE, although hard to establish conclusively due difficulty with comprehension/MR Tone and bulk:normal tone throughout; no atrophy noted Sensory: Pinprick and light touch diminished in the left arm-leg Deep Tendon Reflexes:  1 all over Plantars: Right: downgoingLeft: downgoing Cerebellar: normal finger-to-nose, Couldn't perform heel-to-shin test due to impaired comprehension Gait:  Unable to test     ASSESSMENT/PLAN [name] is a 80 y.o. female with history of HTN, MR with symptomatic epilepsy since childhood (on dilantin, last seizure approximately 50 years ago), osteoporosis and asthma presenting with left hemiparesis, decreased responsiveness, left facial weakness, and dysarthria.  She did not receive  IV t-PA due to minimal deficits.  No definite stroke.possible right brain TIA  Resultant  mild dysarthria  MRI  - No restricted diffusion to suggest acute stroke  MRA - cancelled  Carotid Doppler  pending  2D Echo  Left ventricle: The cavity size was normal. Wall thickness was normal. Systolic function was normal. The estimated ejection fraction was in the range of 60% to 65%. Wall motion was normal; there were no regional wall motion abnormalities  LDL not performed.   HgbA1c 6.5  VTE prophylaxis - Lovenox Diet NPO time specified  aspirin 81 mg daily prior to admission, now on aspirin 81 mg daily  Patient counseled to be compliant with her antithrombotic medications  Ongoing aggressive stroke risk factor management  Therapy recommendations:  Pending  Disposition: Pending  Hypertension  Blood pressure tends to run low at times Permissive hypertension (OK if < 220/120) but gradually normalize in 5-7 days  Hyperlipidemia  Home meds:  No lipid lowering medications prior to admission  LDL not performed   Other Stroke Risk Factors  Advanced age  Obesity, Body mass index is 39.27 kg/(m^2).    Other Active Problems  Seizure disorder  Hospital day # 2       I have personally examined this patient, reviewed notes, independently viewed imaging studies, participated in medical decision  making and plan of care. I have made any additions or clarifications directly to the above note.   I had a long discussion with the patient's family and friend at the bedside and answered questions. Follow Doppler results  Delia Heady, MD Medical Director [facility] Pager: [phone] 08/07/2015 2:26 PM     To contact Stroke Continuity provider, please refer to [email or web address]. After hours, contact General Neurology

## 2015-08-07 NOTE — Progress Notes (Signed)
  Echocardiogram 2D Echocardiogram has been performed.  Arvil ChacoFoster, Emilija Bohman 08/07/2015, 9:48 AM

## 2015-08-07 NOTE — Progress Notes (Signed)
PROGRESS NOTE  Leslie Chandler LKG:[id number] DOB: 04/01/1935 DOA: 08/05/2015 PCP: Willow Ora, MD  HPI/Recap of past 24 hours: Patient is a 80 year old female past oral history of mild mental retardation, epilepsy and hypertension who on 12/18 was seen okay at church in the morning, but then that evening was found at home on her side and not very responsive. Patient's sister called EMS. Patient was noted to have some brown smearing around her mass suspicious for emesis. Initially patient agitated requiring lorazepam and Haldol. Emergency room, workup suspicious for aspiration pneumonia causing sepsis. Patient's initial lactic acid level at 4.97. Given IV fluids and antibiotics and hospitalist called for further evaluation and admission.  Over the next day, patient continued to improve. Today, lactic acid level down to 1.4. Patient herself alone but more awake. Seen by speech therapy and diet changed to dysphagia 2 with thin liquids. Evaluated by neurology with concerns for potential stroke applications. MRI negative. Carotid Dopplers negative.  Assessment/Plan: Principal Problem:   Sepsis (HCC) secondary to aspiration pneumonia: Patient meets criteria given lactic acidosis, tachycardia, tachypnea and leukocytosis and pulmonary source. Resolved Treated with IV fluids, antibiotics, nebulizers, supplemental oxygen. Lactic acid level now normalized. Patient value of by speech therapy and diet so that dysphagia to Active Problems: Hyperkalemia: Secondary to IV fluid supplementation. Have stopped.   Mental retardation   Hypertension: Blood pressure stable   Asthma Obesity: Patient meets criteria with BMI greater than 30   Type 2 diabetes mellitus (HCC): Patient's sister states that she is not diabetic. A1c right at 6.5.  Chronic diastolic heart failure: Noted on echocardiogram. Add ACE inhibitor once hyperkalemia resolves. Beta blocker on discharge.  Code Status: Spoke with patient's sister who  confirmed patient is a DO NOT RESUSCITATE   Family Communication: Left message for patient's sister  Disposition Plan: Likely in the hospital for several potential discharge in next 1-2 days    Consultants:  Neurology  Procedures:  Carotid Dopplers: Preliminary report no significant stenoses  Echocardiogram: Grade 1 diastolic dysfunction  Antibiotics:  IV Zosyn 12/18-present  IV vancomycin 12/18-present    Objective: BP 119/49 mmHg  Pulse 64  Temp(Src) 97.8 F (36.6 C) (Oral)  Resp 16  Ht 4\' 8"  (1.422 m)  Wt 79.4 kg (175 lb 0.7 oz)  BMI 39.27 kg/m2  SpO2 91%  Intake/Output Summary (Last 24 hours) at 08/07/15 1847 Last data filed at 08/07/15 1235  Gross per 24 hour  Intake      0 ml  Output      0 ml  Net      0 ml   Filed Weights   08/05/15 1559 08/06/15 0000  Weight: 79.4 kg (175 lb 0.7 oz) 79.4 kg (175 lb 0.7 oz)    Exam:   General:  No acute distress, thickened speech  Cardiovascular: Regular rate and rhythm, S1-S2   Respiratory: Decreased breath sounds bibasilar   Abdomen: Soft, obese, nontender, positive bowel sounds   Musculoskeletal: No clubbing or cyanosis, trace edema    Data Reviewed: Basic Metabolic Panel:  Recent Labs Lab 08/05/15 1548 08/05/15 1559 08/06/15 0255 08/06/15 0354 08/07/15 0825  NA 141 139  --  139 143  K 4.5 3.5  --  3.5 5.1  CL 103 103  --  108 112*  CO2  --  22  --  22 21*  GLUCOSE 235* 225*  --  161* 102*  BUN 34* 21*  --  20 15  CREATININE 0.90 1.17* 0.90 0.97 0.88  CALCIUM  --  9.0  --  7.7* 8.0*  MG  --   --  1.7  --   --   PHOS  --   --  2.4*  --   --    Liver Function Tests:  Recent Labs Lab 08/05/15 1559 08/06/15 0354 08/07/15 0825  AST 20 19 36  ALT 15 13* 10*  ALKPHOS 124 94 86  BILITOT 0.5 0.3 1.1  PROT 6.4* 5.2* 5.3*  ALBUMIN 3.3* 2.8* 2.6*   No results for input(s): LIPASE, AMYLASE in the last 168 hours. No results for input(s): AMMONIA in the last 168 hours. CBC:  Recent  Labs Lab 08/05/15 1548 08/05/15 1559 08/06/15 0354 08/06/15 0804 08/07/15 0825  WBC  --  11.2* 13.6* 12.9* 12.3*  NEUTROABS  --  7.9* 11.4*  --  7.6  HGB 15.0 13.0 12.2 11.9* 12.1  HCT 44.0 40.9 37.5 36.7 35.5*  MCV  --  97.4 97.7 97.3 96.5  PLT  --  190 150 152 PLATELET CLUMPS NOTED ON SMEAR, COUNT APPEARS ADEQUATE   Cardiac Enzymes:    Recent Labs Lab 08/06/15 0255 08/06/15 0804  TROPONINI <0.03 <0.03   BNP (last 3 results) No results for input(s): BNP in the last 8760 hours.  ProBNP (last 3 results) No results for input(s): PROBNP in the last 8760 hours.  CBG:  Recent Labs Lab 08/06/15 0840  GLUCAP 104*    Recent Results (from the past 240 hour(s))  Culture, blood (routine x 2)     Status: None (Preliminary result)   Collection Time: 08/05/15  6:00 PM  Result Value Ref Range Status   Specimen Description BLOOD LEFT WRIST  Final   Special Requests BOTTLES DRAWN AEROBIC AND ANAEROBIC 5CC  Final   Culture NO GROWTH 2 DAYS  Final   Report Status PENDING  Incomplete  Culture, blood (routine x 2)     Status: None (Preliminary result)   Collection Time: 08/05/15  6:07 PM  Result Value Ref Range Status   Specimen Description BLOOD LEFT THUMB  Final   Special Requests BOTTLES DRAWN AEROBIC ONLY 3CC  Final   Culture NO GROWTH 2 DAYS  Final   Report Status PENDING  Incomplete  Urine culture     Status: None   Collection Time: 08/05/15  6:56 PM  Result Value Ref Range Status   Specimen Description URINE, CLEAN CATCH  Final   Special Requests NONE  Final   Culture 6,000 COLONIES/mL INSIGNIFICANT GROWTH  Final   Report Status 08/06/2015 FINAL  Final  C difficile quick scan w PCR reflex     Status: None   Collection Time: 08/06/15  2:22 AM  Result Value Ref Range Status   C Diff antigen NEGATIVE NEGATIVE Final   C Diff toxin NEGATIVE NEGATIVE Final   C Diff interpretation Negative for toxigenic C. difficile  Final     Studies: Dg Swallowing Func-speech  Pathology  08/07/2015  Objective Swallowing Evaluation:   Patient Details Name: Leslie Chandler MRN: [id number] Date of Birth: 05/01/1935 Today's Date: 08/07/2015 Time: SLP Start Time (ACUTE ONLY): 1500-SLP Stop Time (ACUTE ONLY): 1516 SLP Time Calculation (min) (ACUTE ONLY): 16 min Past Medical History: Past Medical History Diagnosis Date . Epilepsia (HCC)  . Mental retardation  . Hypertension  . Asthma  . Cognitive developmental delay  . Osteoporosis  Past Surgical History: Past Surgical History Procedure Laterality Date . Breast reconstruction   . Dental surgery   HPI: Laurina Fischl is a  80 y.o. female with past medical history of epilepsy, mental retardation, hypertension, asthma, osteoporosis who was brought to the emergency department via EMS after being found flaccid on her left side and aphasic as per her home health caregivers. By the time the patient arrived to the hospital, her symptoms were improved. The patient was noticed to have brown smearing around her mouth, suspicious for emesis. Per patient's sister, she was doing well in the morning, went to church with them, having lunch and then returned home. She does not remember seeing anything unusual on the patient's behavior or any symptoms.  Subjective: upright 90 degrres in chair Assessment / Plan / Recommendation CHL IP CLINICAL IMPRESSIONS 08/07/2015 Therapy Diagnosis Mild oral phase dysphagia;Mild pharyngeal phase dysphagia;Moderate oral phase dysphagia Clinical Impression Patient with much improved performance during MBS as compared to BSE due to being out of bed and positioned upright (90degrees).  Patient demonstrates a mild-moderate oral and mild pharyngeal dysphagia characterized by motor weakness. This results in decreased bolus cohesion lead to premature spillage that is contained to the vallecular space with small portions.  Post swallow base of tongue and vallecular residue is reduced with slow pacing that allows for an extra swallow.  Patient  penetrated thin barium  With large straw sip but second swallow effectively cleared laryngeal vestibule.  No coughing episodes were observed as present during the BSE.  Recommend Dys 2 diet texture with thin liquids and full supervision with skilled SLP follow up.  Impact on safety and function Risk for inadequate nutrition/hydration;Mild aspiration risk   CHL IP TREATMENT RECOMMENDATION 08/07/2015 Treatment Recommendations Therapy as outlined in treatment plan below   Prognosis 08/07/2015 Prognosis for Safe Diet Advancement -- Barriers to Reach Goals Cognitive deficits Barriers/Prognosis Comment -- CHL IP DIET RECOMMENDATION 08/07/2015 SLP Diet Recommendations Dysphagia 2 (Fine chop) solids;Thin liquid Liquid Administration via Cup;Straw Medication Administration Whole meds with puree Compensations Minimize environmental distractions;Slow rate;Small sips/bites;Multiple dry swallows after each bite/sip Postural Changes --   CHL IP OTHER RECOMMENDATIONS 08/07/2015 Recommended Consults -- Oral Care Recommendations Oral care BID Other Recommendations --   CHL IP FOLLOW UP RECOMMENDATIONS 08/07/2015 Follow up Recommendations 24 hour supervision/assistance   CHL IP FREQUENCY AND DURATION 08/07/2015 Speech Therapy Frequency (ACUTE ONLY) min 2x/week Treatment Duration 1 week      CHL IP ORAL PHASE 08/07/2015 Oral Phase Impaired Oral - Pudding Teaspoon -- Oral - Pudding Cup -- Oral - Honey Teaspoon -- Oral - Honey Cup -- Oral - Nectar Teaspoon -- Oral - Nectar Cup -- Oral - Nectar Straw -- Oral - Thin Teaspoon -- Oral - Thin Cup -- Oral - Thin Straw -- Oral - Puree Weak lingual manipulation;Lingual/palatal residue;Piecemeal swallowing;Premature spillage Oral - Mech Soft Reduced posterior propulsion;Weak lingual manipulation;Lingual/palatal residue;Piecemeal swallowing;Premature spillage Oral - Regular -- Oral - Multi-Consistency -- Oral - Pill -- Oral Phase - Comment --  CHL IP PHARYNGEAL PHASE 08/07/2015 Pharyngeal Phase  Impaired Pharyngeal- Pudding Teaspoon -- Pharyngeal -- Pharyngeal- Pudding Cup -- Pharyngeal -- Pharyngeal- Honey Teaspoon -- Pharyngeal -- Pharyngeal- Honey Cup -- Pharyngeal -- Pharyngeal- Nectar Teaspoon Delayed swallow initiation-vallecula;Reduced epiglottic inversion;Reduced tongue base retraction;Pharyngeal residue - valleculae Pharyngeal -- Pharyngeal- Nectar Cup Delayed swallow initiation-vallecula;Reduced epiglottic inversion;Reduced tongue base retraction;Pharyngeal residue - valleculae Pharyngeal -- Pharyngeal- Nectar Straw -- Pharyngeal -- Pharyngeal- Thin Teaspoon Delayed swallow initiation-vallecula;Reduced epiglottic inversion;Reduced tongue base retraction;Pharyngeal residue - valleculae Pharyngeal -- Pharyngeal- Thin Cup Delayed swallow initiation-vallecula;Reduced epiglottic inversion;Reduced tongue base retraction;Pharyngeal residue - valleculae Pharyngeal -- Pharyngeal- Thin Straw Delayed swallow  initiation-vallecula;Reduced epiglottic inversion;Reduced tongue base retraction;Penetration/Aspiration during swallow;Other (Comment);Pharyngeal residue - valleculae Pharyngeal Material enters airway, remains ABOVE vocal cords then ejected out Pharyngeal- Puree -- Pharyngeal -- Pharyngeal- Mechanical Soft -- Pharyngeal -- Pharyngeal- Regular -- Pharyngeal -- Pharyngeal- Multi-consistency -- Pharyngeal -- Pharyngeal- Pill -- Pharyngeal -- Pharyngeal Comment a slow pace allowed time for second swallows which effeectively reduced base of tongue and vallecular residue   CHL IP CERVICAL ESOPHAGEAL PHASE 08/07/2015 Cervical Esophageal Phase WFL Pudding Teaspoon -- Pudding Cup -- Honey Teaspoon -- Honey Cup -- Nectar Teaspoon -- Nectar Cup -- Nectar Straw -- Thin Teaspoon -- Thin Cup -- Thin Straw -- Puree -- Mechanical Soft -- Regular -- Multi-consistency -- Pill -- Cervical Esophageal Comment -- Charlane Ferretti., CCC-SLP [phone] BOWIE,MELISSA 08/07/2015, 4:49 PM               Scheduled Meds: .  aspirin EC  81 mg Oral Daily  . budesonide  0.5 mg Inhalation BID  . enoxaparin (LOVENOX) injection  40 mg Subcutaneous Q24H  . ferrous sulfate  220 mg Oral Daily  . lisinopril  10 mg Oral Daily   And  . hydrochlorothiazide  12.5 mg Oral Daily  . ipratropium-albuterol  3 mL Nebulization Q6H  . levothyroxine  88 mcg Oral QAC breakfast  . montelukast  10 mg Oral QHS  . phenytoin (DILANTIN) IV  125 mg Intravenous Q12H  . piperacillin-tazobactam (ZOSYN)  IV  3.375 g Intravenous Q8H  . raloxifene  60 mg Oral Daily  . vancomycin  1,000 mg Intravenous Q24H    Continuous Infusions: . 0.9 % NaCl with KCl 20 mEq / L 100 mL/hr at 08/06/15 0142     Time spent: 25 min   Hollice Espy  [facility] Pager [phone] . If 7PM-7AM, please contact night-coverage at [email or web address], password [facility] 08/07/2015, 6:47 PM  LOS: 2 days

## 2015-08-07 NOTE — Evaluation (Signed)
Clinical/Bedside Swallow Evaluation Patient Details  Name: Leslie Chandler MRN: [id number] Date of Birth: 1935/04/21  Today's Date: 08/07/2015 Time: SLP Start Time (ACUTE ONLY): 1405 SLP Stop Time (ACUTE ONLY): 1416 SLP Time Calculation (min) (ACUTE ONLY): 11 min  Past Medical History:  Past Medical History  Diagnosis Date  . Epilepsia (HCC)   . Mental retardation   . Hypertension   . Asthma   . Cognitive developmental delay   . Osteoporosis    Past Surgical History:  Past Surgical History  Procedure Laterality Date  . Breast reconstruction    . Dental surgery     HPI:  Leslie Chandler is a 80 y.o. female with past medical history of epilepsy, mental retardation, hypertension, asthma, osteoporosis who was brought to the emergency department via EMS after being found flaccid on her left side and aphasic as per her home health caregivers. By the time the patient arrived to the hospital, her symptoms were improved. The patient was noticed to have brown smearing around her mouth, suspicious for emesis. Per patient's sister, she was doing well in the morning, went to church with them, having lunch and then returned home. She does not remember seeing anything unusual on the patient's behavior or any symptoms.    Assessment / Plan / Recommendation Clinical Impression  Bedside swallow evaluation complete.  Patient awake and requesting PO.  Patient required Total assist for PO intake and demonstrated a suspected delayed swallow initiation, decreased hyolarygeal elevation, multiple swallows and delayed throat clears and coughs across consistencies.  Given history of dyspahiga with s/s across tested consistencies recommend an objective assessment to determine safest PO intake.      Aspiration Risk  Severe aspiration risk;Risk for inadequate nutrition/hydration    Diet Recommendation NPO   Medication Administration: Via alternative means    Other  Recommendations Oral Care Recommendations: Oral care  QID   Follow up Recommendations   (TBD)                        Swallow Study   General Date of Onset: 08/05/15 HPI: Leslie Chandler is a 80 y.o. female with past medical history of epilepsy, mental retardation, hypertension, asthma, osteoporosis who was brought to the emergency department via EMS after being found flaccid on her left side and aphasic as per her home health caregivers. By the time the patient arrived to the hospital, her symptoms were improved. The patient was noticed to have brown smearing around her mouth, suspicious for emesis. Per patient's sister, she was doing well in the morning, went to church with them, having lunch and then returned home. She does not remember seeing anything unusual on the patient's behavior or any symptoms.  Type of Study: Bedside Swallow Evaluation Previous Swallow Assessment: MBS 8/15 recommending Dys.3 nectar; 10/15 thins Diet Prior to this Study: NPO Temperature Spikes Noted: No Respiratory Status: Room air History of Recent Intubation: No Behavior/Cognition: Lethargic/Drowsy;Requires cueing Oral Cavity Assessment: Other (comment) (general weakness) Oral Care Completed by SLP: Recent completion by staff Self-Feeding Abilities: Total assist Patient Positioning: Partially reclined;Postural control adequate for testing Baseline Vocal Quality: Normal Volitional Cough: Cognitively unable to elicit Volitional Swallow: Unable to elicit    Oral/Motor/Sensory Function Overall Oral Motor/Sensory Function: Generalized oral weakness   Ice Chips Ice chips: Not tested   Thin Liquid Thin Liquid: Impaired Presentation: Straw Pharyngeal  Phase Impairments: Suspected delayed Swallow;Decreased hyoid-laryngeal movement;Multiple swallows;Wet Vocal Quality;Throat Clearing - Delayed;Cough - Delayed    Nectar Thick  Nectar Thick Liquid: Not tested   Honey Thick Honey Thick Liquid: Not tested   Puree Puree: Impaired Presentation: Spoon Pharyngeal Phase  Impairments: Suspected delayed Swallow;Decreased hyoid-laryngeal movement;Multiple swallows;Throat Clearing - Delayed;Cough - Delayed Other Comments: 1/2 teaspoon administered   Solid Solid: Not tested      Fae PippinMelissa Daemon Dowty, M.A., CCC-SLP [phone]  Zavia Pullen 08/07/2015,2:33 PM

## 2015-08-08 DIAGNOSIS — I5033 Acute on chronic diastolic (congestive) heart failure: Secondary | ICD-10-CM | POA: Diagnosis present

## 2015-08-08 DIAGNOSIS — I5032 Chronic diastolic (congestive) heart failure: Secondary | ICD-10-CM | POA: Diagnosis present

## 2015-08-08 LAB — BASIC METABOLIC PANEL
ANION GAP: 10 (ref 5–15)
BUN: 11 mg/dL (ref 6–20)
CHLORIDE: 110 mmol/L (ref 101–111)
CO2: 24 mmol/L (ref 22–32)
Calcium: 8.5 mg/dL — ABNORMAL LOW (ref 8.9–10.3)
Creatinine, Ser: 0.86 mg/dL (ref 0.44–1.00)
GFR calc non Af Amer: >60 mL/min (ref >60)
Glucose, Bld: 92 mg/dL (ref 65–99)
Potassium: 3.7 mmol/L (ref 3.5–5.1)
SODIUM: 144 mmol/L (ref 135–145)

## 2015-08-08 LAB — CBC WITH DIFFERENTIAL/PLATELET
BASOS ABS: 0 10*3/uL (ref 0.0–0.1)
BASOS PCT: 0 %
EOS ABS: 0.3 10*3/uL (ref 0.0–0.7)
Eosinophils Relative: 4 %
HEMATOCRIT: 35 % — AB (ref 36.0–46.0)
HEMOGLOBIN: 11.4 g/dL — AB (ref 12.0–15.0)
Lymphocytes Relative: 13 %
Lymphs Abs: 1 10*3/uL (ref 0.7–4.0)
MCH: 31.7 pg (ref 26.0–34.0)
MCHC: 32.6 g/dL (ref 30.0–36.0)
MCV: 97.2 fL (ref 78.0–100.0)
Monocytes Absolute: 0.7 10*3/uL (ref 0.1–1.0)
Monocytes Relative: 9 %
NEUTROS ABS: 5.7 10*3/uL (ref 1.7–7.7)
NEUTROS PCT: 74 %
Platelets: 138 10*3/uL — ABNORMAL LOW (ref 150–400)
RBC: 3.6 MIL/uL — AB (ref 3.87–5.11)
RDW: 13.9 % (ref 11.5–15.5)
WBC: 7.7 10*3/uL (ref 4.0–10.5)

## 2015-08-08 LAB — BRAIN NATRIURETIC PEPTIDE: B NATRIURETIC PEPTIDE 5: 224.6 pg/mL — AB (ref 0.0–100.0)

## 2015-08-08 MED ORDER — PHENYTOIN 125 MG/5ML PO SUSP
125.0000 mg | Freq: Two times a day (BID) | ORAL | Status: DC
  Administered 2015-08-08: 125 mg via ORAL
  Filled 2015-08-08 (×4): qty 5

## 2015-08-08 MED ORDER — LEVOFLOXACIN 500 MG PO TABS: 500.0000 mg | ORAL_TABLET | Freq: Every day | ORAL | Status: AC

## 2015-08-08 MED ORDER — IPRATROPIUM-ALBUTEROL 0.5-2.5 (3) MG/3ML IN SOLN
3.0000 mL | Freq: Three times a day (TID) | RESPIRATORY_TRACT | Status: DC
  Administered 2015-08-08 (×2): 3 mL via RESPIRATORY_TRACT
  Filled 2015-08-08 (×2): qty 3

## 2015-08-08 MED ORDER — CARVEDILOL 3.125 MG PO TABS: 3.1250 mg | ORAL_TABLET | Freq: Two times a day (BID) | ORAL | Status: DC

## 2015-08-08 NOTE — Progress Notes (Signed)
Nsg Discharge Note  Admit Date:  08/05/2015 Discharge date: 08/08/2015   Leslie Chandler to be D/C'd Home (group home) per MD order.  AVS completed.  Copy for chart, and copy for patient signed, and dated. Patient/caregiver able to verbalize understanding.  Discharge Medication:   Medication List    TAKE these medications        albuterol (2.5 MG/3ML) 0.083% nebulizer solution  Commonly known as:  PROVENTIL  Take 2.5 mg by nebulization every 6 (six) hours as needed for wheezing or shortness of breath.     albuterol 108 (90 BASE) MCG/ACT inhaler  Commonly known as:  PROVENTIL HFA;VENTOLIN HFA  Inhale into the lungs every 6 (six) hours as needed for wheezing or shortness of breath.     aspirin 81 MG chewable tablet  Chew by mouth daily.     CALCIUM & VIT D3 BONE HEALTH Liqd  Take 3 mLs by mouth daily.     carvedilol 3.125 MG tablet  Commonly known as:  COREG  Take 1 tablet (3.125 mg total) by mouth 2 (two) times daily with a meal.     ferrous sulfate 220 (44 FE) MG/5ML solution  Take 220 mg by mouth daily.     fluticasone 110 MCG/ACT inhaler  Commonly known as:  FLOVENT HFA  Inhale 2 puffs into the lungs 2 (two) times daily.     HEMORRHOIDAL Oint  Place 1 application rectally 2 (two) times daily as needed (hemorrhoids).     levofloxacin 500 MG tablet  Commonly known as:  LEVAQUIN  Take 1 tablet (500 mg total) by mouth daily.     levothyroxine 88 MCG tablet  Commonly known as:  SYNTHROID, LEVOTHROID  Take 88 mcg by mouth daily before breakfast.     lisinopril-hydrochlorothiazide 10-12.5 MG tablet  Commonly known as:  PRINZIDE,ZESTORETIC  Take 1 tablet by mouth daily.     montelukast 10 MG tablet  Commonly known as:  SINGULAIR  Take 10 mg by mouth at bedtime.     phenytoin 125 MG/5ML suspension  Commonly known as:  DILANTIN  Take 125 mg by mouth 2 (two) times daily.     raloxifene 60 MG tablet  Commonly known as:  EVISTA  Take 60 mg by mouth daily.     VIACTIV  MULTI-VITAMIN Chew  Chew 1 tablet by mouth daily.        Discharge Assessment: Filed Vitals:   08/07/15 2111 08/08/15 0531  BP: 160/73 156/73  Pulse: 98 85  Temp: 97.5 F (36.4 C) 97.4 F (36.3 C)  Resp: 18 18   Skin clean, dry and intact without evidence of skin break down, no evidence of skin tears noted. IV catheter discontinued intact. Site without signs and symptoms of complications - no redness or edema noted at insertion site, patient denies c/o pain - only slight tenderness at site.  Dressing with slight pressure applied.  D/c Instructions-Education: Discharge instructions given to patient/family with verbalized understanding. D/c education completed with patient/family including follow up instructions, medication list, d/c activities limitations if indicated, with other d/c instructions as indicated by MD - patient able to verbalize understanding, all questions fully answered. AVS given to sister (poa). Patient instructed to return to ED, call 911, or call MD for any changes in condition.  Patient escorted via WC, and D/C home via private auto.  Kern ReapBrumagin, Leslie Williamson L, RN 08/08/2015 3:39 PM

## 2015-08-08 NOTE — Clinical Social Work Note (Signed)
Clinical Social Work Assessment  Patient Details  Name: Leslie Chandler MRN: [id number] Date of Birth: 04/01/1935  Date of referral:  08/07/15               Reason for consult:  Facility Placement                Permission sought to share information with:  Oceanographeracility Contact Representative Permission granted to share information::  Yes, Verbal Permission Granted (Patient has MR; [name])  Name::        Agency::  [facility]  Relationship::     Contact Information:     Housing/Transportation Living arrangements for the past 2 months:  Group Home Source of Information:  Facility Patient Interpreter Needed:  None Criminal Activity/Legal Involvement Pertinent to Current Situation/Hospitalization:  No - Comment as needed Significant Relationships:  Siblings Lives with:  Self Do you feel safe going back to the place where you live?  Yes Need for family participation in patient care:  No (Coment)  Care giving concerns:  CSW consulted regarding discharge planning needs. [facility]. Patient has MR. CSW [name] Jasmine confirmed that patient would be able to return to group home at discharge. Group home will provide transportation.    Social Worker assessment / plan:  Patient to return to group home at discharge.  Employment status:  Retired Health and safety inspectornsurance information:  Medicare PT Recommendations:  Not assessed at this time Information / Referral to community resources:     Patient/Family's Response to care:  Patient has MR. Group home expressed understanding that patient will return to Group Home at discharge.   Patient/Family's Understanding of and Emotional Response to Diagnosis, Current Treatment, and Prognosis:  No questions/concerns about plan or treatment.    Emotional Assessment Appearance:  Appears stated age Attitude/Demeanor/Rapport:   (Patient has MR.  ) Affect (typically observed):   (Patient has MR.) Orientation:  Oriented to Self Alcohol / Substance use:  Not Applicable Psych involvement (Current and /or in the community):  No (Comment)  Discharge Needs  Concerns to be addressed:  No discharge needs identified Readmission within the last 30 days:  No Current discharge risk:  None Barriers to Discharge:  Continued Medical Work up   [employer] Incadia S Nishtha Raider, LCSWA 08/08/2015, 9:07 AM

## 2015-08-08 NOTE — Care Management Important Message (Signed)
Important Message  Patient Details  Name: Leslie Chandler MRN: [id number] Date of Birth: Dec 28, 1934   Medicare Important Message Given:  Yes    [name], [name] 08/08/2015, 5:07 PM

## 2015-08-08 NOTE — Progress Notes (Signed)
Pharmacy Antibiotic Follow-up Note  Leslie Chandler is a 80 y.o. year-old female admitted on 08/05/2015. The patient is currently on day 4 of Zosyn and vancomycin for sepsis from questionable aspiration in setting of possible R brain TIA.  Assessment/Plan: 1. Continue EI Zosyn 3.375 gm IV every 8 hours 2. Remains on vancomycin 1000 mg IV every 24 hours; ? If able to discontinue with concern for aspiration as source of sepsis. If continued will likely obtain trough before 12/22 dose 3. Cx data remains no growth to date; deescalate antimicrobials as feasible 4. Following with you daily   Temp (24hrs), Avg:97.5 F (36.4 C), Min:97.4 F (36.3 C), Max:97.5 F (36.4 C)   Recent Labs Lab 08/05/15 1559 08/06/15 0354 08/06/15 0804 08/07/15 0825 08/08/15 0534  WBC 11.2* 13.6* 12.9* 12.3* 7.7    Recent Labs Lab 08/05/15 1548 08/05/15 1559 08/06/15 0255 08/06/15 0354 08/07/15 0825  CREATININE 0.90 1.17* 0.90 0.97 0.88   No Known Allergies  Antimicrobials this admission: Vancomycin 12/19>  Zosyn 12/19>   Levels/dose changes this admission: None to date  Microbiology results: 12/18 BCx: ngtd 12/18 UCx: ngF 12/19 Cdiff: negative  Thank you for allowing pharmacy to be a part of this patient's care.  Pollyann SamplesAndy Ina Scrivens, PharmD, BCPS 08/08/2015, 8:04 AM Pager: [phone]

## 2015-08-08 NOTE — Discharge Summary (Signed)
Discharge Summary  Leslie Chandler WUJ:[id number] DOB: 1935/01/20  PCP: Willow Ora, MD  Admit date: 08/05/2015 Discharge date: 08/08/2015  Time spent: 25 minutes   Recommendations for Outpatient Follow-up:  1. New medication: Levaquin 500 mg by mouth daily 4 days and 2. New medication: Coreg 3.125 mg by mouth twice a day 3. Diet: Patient's diet downgraded to a dysphagia 2 with thin liquids  Discharge Diagnoses:  Active Hospital Problems   Diagnosis Date Noted  . Sepsis (HCC) 08/05/2015  . Chronic diastolic heart failure (HCC) 08/08/2015  . Aspiration pneumonia (HCC) 08/06/2015  . Diarrhea 08/05/2015  . Type 2 diabetes mellitus (HCC) 08/05/2015  . Lactic acidosis 08/05/2015  . Mental retardation   . Hypertension   . Asthma   . HCAP (healthcare-associated pneumonia) 04/11/2014    Resolved Hospital Problems   Diagnosis Date Noted Date Resolved  No resolved problems to display.    Discharge Condition: Improved, being discharged back to group home   Diet recommendation: Dysphagia 2 with thin liquids   Filed Weights   08/05/15 1559 08/06/15 0000  Weight: 79.4 kg (175 lb 0.7 oz) 79.4 kg (175 lb 0.7 oz)    History of present illness:  Patient is a 80 year old female past oral history of mild mental retardation, epilepsy and hypertension who on 12/18 was seen okay at church in the morning, but then that evening was found at home on her side and not very responsive. Patient's sister called EMS. Patient was noted to have some brown smearing around her mass suspicious for emesis. Initially patient agitated requiring lorazepam and Haldol. Emergency room, workup suspicious for aspiration pneumonia causing sepsis. Patient's initial lactic acid level at 4.97. Given IV fluids and antibiotics and hospitalist called for further evaluation and admission.   Hospital Course:  Principal Problem:   Sepsis (HCC) secondary to aspiration pneumonia causing acute respiratory failure with hypoxia:  Patient meets criteria on admission given lactic acidosis, tachycardia, tachypnea and leukocytosis plus pulmonary source. Patient initially requiring 2 L nasal cannula to keep oxygen saturations greater than 90%. Sepsis resolved and by following day, lactic acid level normalized. Aspiration anemia felt to be secondary to dysphagia. Seen by speech therapy and diet downgraded to dysphagia 2 with thin liquids. Patient by 12/21, at baseline and awake. Oxygen saturations greater than 90% on room air. Active Problems:    Mental retardation: Stable, will return back to group home   Hypertension: Elevated blood pressures, by mouth meds resumed. Initially given decreased level of consciousness, concerns for possible acute CVA. Neurology consulted. MRI negative. Initial stroke workup revealed grade 1 diastolic dysfunction on echocardiogram, but otherwise normal. No findings of CVA.     Type 2 diabetes mellitus (HCC): Stable. Monitor with sliding scale    Chronic diastolic heart failure (HCC): Incidentally found on echocardiogram. Euvolemic. Already on ACE inhibitor, added low-dose Coreg on discharge Obesity: Patient meets criteria with BMI greater than 30  Procedures:  Echocardiogram done 12/20: Grade 1 diastolic dysfunction   Consultations:  Neurology   Discharge Exam: BP 156/73 mmHg  Pulse 85  Temp(Src) 97.4 F (36.3 C) (Oral)  Resp 18  Ht  (1.422 m)  Wt 79.4 kg (175 lb 0.7 oz)  BMI 39.27 kg/m2  SpO2 95%  General: Alert and oriented 1, baseline  Cardiovascular: Regular rate and rhythm, S1-S2, 2/6 systolic ejection murmur  Respiratory: Clear to auscultation bilaterally   Discharge Instructions You were cared for by a hospitalist during your hospital stay. If you have any questions about  your discharge medications or the care you received while you were in the hospital after you are discharged, you can call the unit and asked to speak with the hospitalist on call if the hospitalist  that took care of you is not available. Once you are discharged, your primary care physician will handle any further medical issues. Please note that NO REFILLS for any discharge medications will be authorized once you are discharged, as it is imperative that you return to your primary care physician (or establish a relationship with a primary care physician if you do not have one) for your aftercare needs so that they can reassess your need for medications and monitor your lab values.     Medication List    TAKE these medications        albuterol (2.5 MG/3ML) 0.083% nebulizer solution  Commonly known as:  PROVENTIL  Take 2.5 mg by nebulization every 6 (six) hours as needed for wheezing or shortness of breath.     albuterol 108 (90 BASE) MCG/ACT inhaler  Commonly known as:  PROVENTIL HFA;VENTOLIN HFA  Inhale into the lungs every 6 (six) hours as needed for wheezing or shortness of breath.     aspirin 81 MG chewable tablet  Chew by mouth daily.     CALCIUM & VIT D3 BONE HEALTH Liqd  Take 3 mLs by mouth daily.     ferrous sulfate 220 (44 FE) MG/5ML solution  Take 220 mg by mouth daily.     fluticasone 110 MCG/ACT inhaler  Commonly known as:  FLOVENT HFA  Inhale 2 puffs into the lungs 2 (two) times daily.     HEMORRHOIDAL Oint  Place 1 application rectally 2 (two) times daily as needed (hemorrhoids).     levothyroxine 88 MCG tablet  Commonly known as:  SYNTHROID, LEVOTHROID  Take 88 mcg by mouth daily before breakfast.     lisinopril-hydrochlorothiazide 10-12.5 MG tablet  Commonly known as:  PRINZIDE,ZESTORETIC  Take 1 tablet by mouth daily.     montelukast 10 MG tablet  Commonly known as:  SINGULAIR  Take 10 mg by mouth at bedtime.     phenytoin 125 MG/5ML suspension  Commonly known as:  DILANTIN  Take 125 mg by mouth 2 (two) times daily.     raloxifene 60 MG tablet  Commonly known as:  EVISTA  Take 60 mg by mouth daily.     VIACTIV MULTI-VITAMIN Chew  Chew 1 tablet  by mouth daily.       No Known Allergies    The results of significant diagnostics from this hospitalization (including imaging, microbiology, ancillary and laboratory) are listed below for reference.    Significant Diagnostic Studies: Ct Head Wo Contrast  08/05/2015  CLINICAL DATA:  Left-sided facial weakness and difficulty talking. Code stroke. Initial encounter. EXAM: CT HEAD WITHOUT CONTRAST TECHNIQUE: Contiguous axial images were obtained from the base of the skull through the vertex without intravenous contrast. COMPARISON:  None. FINDINGS: There is no evidence of acute intracranial hemorrhage, mass lesion, brain edema or extra-axial fluid collection. The ventricles and subarachnoid spaces are mildly prominent for age, especially in the posterior fossa where there is significant cerebellar atrophy. There is no CT evidence of acute cortical infarction. There are extensive chronic small vessel ischemic changes in the periventricular white matter, especially within the parietal lobes bilaterally. The visualized paranasal sinuses, mastoid air cells and middle ears are clear. The calvarium is intact. Possible deformity of the right condylar head versus motion artifact. IMPRESSION:  1. No evidence of acute stroke or acute intracranial hemorrhage. 2. Extensive chronic periventricular white matter disease, greatest in the parietal lobes bilaterally. 3. Severe cerebellar atrophy. 4. Critical Value/emergent results were called by telephone at the time of interpretation on 08/05/2015 at 4:30 pm to Dr. Leroy Kennedy, who verbally acknowledged these results. Electronically Signed   By: Carey Bullocks M.D.   On: 08/05/2015 16:31   Mr Brain Wo Contrast  08/05/2015  CLINICAL DATA:  Code stroke. Acute onset of LEFT hemiparesis. Decreased responsiveness. EXAM: MRI HEAD WITHOUT CONTRAST TECHNIQUE: Multiplanar, multiecho pulse sequences of the brain and surrounding structures were obtained without intravenous contrast.  COMPARISON:  CT head earlier today. FINDINGS: The patient was unable to remain motionless for the exam. Small or subtle lesions could be overlooked. No evidence for acute stroke, acute hemorrhage, mass lesion, hydrocephalus, or extra-axial fluid. There is severe cerebellar atrophy and moderate cerebral atrophy. There is extensive periventricular white matter signal abnormality with cystic change, and gliosis, without chronic hemorrhage. These findings are greater in the RIGHT greater than LEFT parieto-occipital regions but also affect the white matter surrounding the frontal horns, with associated cortical volume loss. The findings are most consistent with severe periventricular leukomalacia. BILATERAL posterior fossa hygromas surround the atrophic cerebellum. No restricted diffusion to suggest acute or recent epileptiform activity. Flow voids are maintained in the carotid, basilar, and LEFT vertebral arteries. RIGHT vertebral is diminutive. Negative orbits. No sinus or mastoid disease. Thickened calvarium, likely secondary effect of the lap. 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 These changes appear worse on the RIGHT in the supratentorial region, and some degree of chronic LEFT hemiparesis could be expected. Advanced cerebellar atrophy with ex vacuo posterior fossa hygromas. No restricted diffusion to suggest acute stroke, nor is there evidence for recent epileptiform activity. Electronically Signed   By: Elsie Stain M.D.   On: 08/05/2015 20:45   Dg Chest Portable 1 View  08/05/2015  CLINICAL DATA:  Code stroke.  Left-sided weakness and aphasia. EXAM: PORTABLE CHEST - 1 VIEW COMPARISON:  Chest x-ray 04/11/2014. FINDINGS: The heart is enlarged. A large hiatal hernia is again noted. Diffuse interstitial and airspace disease is present. Right middle lobe or  lower lobe airspace disease is evident. The lung volumes are low. IMPRESSION: 1. Cardiomegaly and mild edema. 2. Asymmetric right middle or lower lobe airspace disease concerning for pneumonia or aspiration. 3. Large hiatal hernia again noted. Electronically Signed   By: Marin Roberts M.D.   On: 08/05/2015 16:46   Dg Swallowing Func-speech Pathology  08/07/2015  Objective Swallowing Evaluation:   Patient Details Name: Stepheni Cameron MRN: [id number] Date of Birth: 04/16/1935 Today's Date: 08/07/2015 Time: SLP Start Time (ACUTE ONLY): 1500-SLP Stop Time (ACUTE ONLY): 1516 SLP Time Calculation (min) (ACUTE ONLY): 16 min Past Medical History: Past Medical History Diagnosis Date . Epilepsia (HCC)  . Mental retardation  . Hypertension  . Asthma  . Cognitive developmental delay  . Osteoporosis  Past Surgical History: Past Surgical History Procedure Laterality Date . Breast reconstruction   . Dental surgery   HPI: Theodosia Bahena is a 79 y.o. female with past medical history of epilepsy, mental retardation, hypertension, asthma, osteoporosis who was brought to the emergency department via EMS after being found flaccid on her left side and aphasic as per her home health caregivers. By the time the patient arrived to the hospital, her symptoms were improved. The  patient was noticed to have brown smearing around her mouth, suspicious for emesis. Per patient's sister, she was doing well in the morning, went to church with them, having lunch and then returned home. She does not remember seeing anything unusual on the patient's behavior or any symptoms.  Subjective: upright 90 degrres in chair Assessment / Plan / Recommendation CHL IP CLINICAL IMPRESSIONS 08/07/2015 Therapy Diagnosis Mild oral phase dysphagia;Mild pharyngeal phase dysphagia;Moderate oral phase dysphagia Clinical Impression Patient with much improved performance during MBS as compared to BSE due to being out of bed and positioned upright (90degrees).  Patient  demonstrates a mild-moderate oral and mild pharyngeal dysphagia characterized by motor weakness. This results in decreased bolus cohesion lead to premature spillage that is contained to the vallecular space with small portions.  Post swallow base of tongue and vallecular residue is reduced with slow pacing that allows for an extra swallow.  Patient penetrated thin barium  With large straw sip but second swallow effectively cleared laryngeal vestibule.  No coughing episodes were observed as present during the BSE.  Recommend Dys 2 diet texture with thin liquids and full supervision with skilled SLP follow up.  Impact on safety and function Risk for inadequate nutrition/hydration;Mild aspiration risk   CHL IP TREATMENT RECOMMENDATION 08/07/2015 Treatment Recommendations Therapy as outlined in treatment plan below   Prognosis 08/07/2015 Prognosis for Safe Diet Advancement -- Barriers to Reach Goals Cognitive deficits Barriers/Prognosis Comment -- CHL IP DIET RECOMMENDATION 08/07/2015 SLP Diet Recommendations Dysphagia 2 (Fine chop) solids;Thin liquid Liquid Administration via Cup;Straw Medication Administration Whole meds with puree Compensations Minimize environmental distractions;Slow rate;Small sips/bites;Multiple dry swallows after each bite/sip Postural Changes --   CHL IP OTHER RECOMMENDATIONS 08/07/2015 Recommended Consults -- Oral Care Recommendations Oral care BID Other Recommendations --   CHL IP FOLLOW UP RECOMMENDATIONS 08/07/2015 Follow up Recommendations 24 hour supervision/assistance   CHL IP FREQUENCY AND DURATION 08/07/2015 Speech Therapy Frequency (ACUTE ONLY) min 2x/week Treatment Duration 1 week      CHL IP ORAL PHASE 08/07/2015 Oral Phase Impaired Oral - Pudding Teaspoon -- Oral - Pudding Cup -- Oral - Honey Teaspoon -- Oral - Honey Cup -- Oral - Nectar Teaspoon -- Oral - Nectar Cup -- Oral - Nectar Straw -- Oral - Thin Teaspoon -- Oral - Thin Cup -- Oral - Thin Straw -- Oral - Puree Weak lingual  manipulation;Lingual/palatal residue;Piecemeal swallowing;Premature spillage Oral - Mech Soft Reduced posterior propulsion;Weak lingual manipulation;Lingual/palatal residue;Piecemeal swallowing;Premature spillage Oral - Regular -- Oral - Multi-Consistency -- Oral - Pill -- Oral Phase - Comment --  CHL IP PHARYNGEAL PHASE 08/07/2015 Pharyngeal Phase Impaired Pharyngeal- Pudding Teaspoon -- Pharyngeal -- Pharyngeal- Pudding Cup -- Pharyngeal -- Pharyngeal- Honey Teaspoon -- Pharyngeal -- Pharyngeal- Honey Cup -- Pharyngeal -- Pharyngeal- Nectar Teaspoon Delayed swallow initiation-vallecula;Reduced epiglottic inversion;Reduced tongue base retraction;Pharyngeal residue - valleculae Pharyngeal -- Pharyngeal- Nectar Cup Delayed swallow initiation-vallecula;Reduced epiglottic inversion;Reduced tongue base retraction;Pharyngeal residue - valleculae Pharyngeal -- Pharyngeal- Nectar Straw -- Pharyngeal -- Pharyngeal- Thin Teaspoon Delayed swallow initiation-vallecula;Reduced epiglottic inversion;Reduced tongue base retraction;Pharyngeal residue - valleculae Pharyngeal -- Pharyngeal- Thin Cup Delayed swallow initiation-vallecula;Reduced epiglottic inversion;Reduced tongue base retraction;Pharyngeal residue - valleculae Pharyngeal -- Pharyngeal- Thin Straw Delayed swallow initiation-vallecula;Reduced epiglottic inversion;Reduced tongue base retraction;Penetration/Aspiration during swallow;Other (Comment);Pharyngeal residue - valleculae Pharyngeal Material enters airway, remains ABOVE vocal cords then ejected out Pharyngeal- Puree -- Pharyngeal -- Pharyngeal- Mechanical Soft -- Pharyngeal -- Pharyngeal- Regular -- Pharyngeal -- Pharyngeal- Multi-consistency -- Pharyngeal -- Pharyngeal- Pill -- Pharyngeal -- Pharyngeal Comment a slow  pace allowed time for second swallows which effeectively reduced base of tongue and vallecular residue   CHL IP CERVICAL ESOPHAGEAL PHASE 08/07/2015 Cervical Esophageal Phase WFL Pudding Teaspoon --  Pudding Cup -- Honey Teaspoon -- Honey Cup -- Nectar Teaspoon -- Nectar Cup -- Nectar Straw -- Thin Teaspoon -- Thin Cup -- Thin Straw -- Puree -- Mechanical Soft -- Regular -- Multi-consistency -- Pill -- Cervical Esophageal Comment -- Charlane Ferretti., CCC-SLP [phone] BOWIE,MELISSA 08/07/2015, 4:49 PM              Ct Angio Chest Aorta W/cm &/or Wo/cm  08/05/2015  CLINICAL DATA:  Hypotension, question aortic dissection, history of hypertension and asthma EXAM: CT ANGIOGRAPHY CHEST, ABDOMEN AND PELVIS TECHNIQUE: Multidetector CT imaging through the chest, abdomen and pelvis was performed using the standard protocol during bolus administration of intravenous contrast. Multiplanar reconstructed images and MIPs were obtained and reviewed to evaluate the vascular anatomy. CONTRAST:  80mL OMNIPAQUE IOHEXOL 350 MG/ML SOLN IV COMPARISON:  CT chest without contrast 04/11/2014 FINDINGS: CTA CHEST FINDINGS Precontrast images demonstrate normal caliber thoracic aorta with minimal scattered atherosclerotic calcification. No evidence of aneurysm or intramural hematoma like pre contrast images. Following contrast, normal aortic enhancement is seen without evidence of aortic dissection. Tortuous great vessels. Pulmonary arteries grossly patent on nondedicated exam. Large hiatal hernia. No thoracic adenopathy. Subsegmental atelectasis in both lower lobes. No pulmonary infiltrate, pleural effusion or pneumothorax. Diffuse osseous demineralization without acute osseous findings. Review of the MIP images confirms the above findings. CTA ABDOMEN AND PELVIS FINDINGS Scattered atherosclerotic calcifications of the abdominal aorta without aneurysm or dissection. Atherosclerotic plaques are seen at the origin of the celiac artery without significant narrowing. Calcified gallstones in gallbladder up to 18 mm diameter. No biliary dilatation. Liver, spleen, pancreas, kidneys, and adrenal glands appear normal. Normal appendix.  Remainder of stomach unremarkable. Bowel loops grossly unremarkable. Normal appearing bladder, ureters, uterus and adnexa. No mass, adenopathy, free air, free fluid, or inflammatory process. Bones demineralized. Review of the MIP images confirms the above findings. IMPRESSION: Scattered atherosclerotic calcifications without evidence of aortic aneurysm or dissection. Subsegmental atelectasis BILATERAL lower lobes. Cholelithiasis. Electronically Signed   By: Ulyses Southward M.D.   On: 08/05/2015 17:57   Ct Cta Abd/pel W/cm &/or W/o Cm  08/05/2015  CLINICAL DATA:  Hypotension, question aortic dissection, history of hypertension and asthma EXAM: CT ANGIOGRAPHY CHEST, ABDOMEN AND PELVIS TECHNIQUE: Multidetector CT imaging through the chest, abdomen and pelvis was performed using the standard protocol during bolus administration of intravenous contrast. Multiplanar reconstructed images and MIPs were obtained and reviewed to evaluate the vascular anatomy. CONTRAST:  80mL OMNIPAQUE IOHEXOL 350 MG/ML SOLN IV COMPARISON:  CT chest without contrast 04/11/2014 FINDINGS: CTA CHEST FINDINGS Precontrast images demonstrate normal caliber thoracic aorta with minimal scattered atherosclerotic calcification. No evidence of aneurysm or intramural hematoma like pre contrast images. Following contrast, normal aortic enhancement is seen without evidence of aortic dissection. Tortuous great vessels. Pulmonary arteries grossly patent on nondedicated exam. Large hiatal hernia. No thoracic adenopathy. Subsegmental atelectasis in both lower lobes. No pulmonary infiltrate, pleural effusion or pneumothorax. Diffuse osseous demineralization without acute osseous findings. Review of the MIP images confirms the above findings. CTA ABDOMEN AND PELVIS FINDINGS Scattered atherosclerotic calcifications of the abdominal aorta without aneurysm or dissection. Atherosclerotic plaques are seen at the origin of the celiac artery without significant  narrowing. Calcified gallstones in gallbladder up to 18 mm diameter. No biliary dilatation. Liver, spleen, pancreas, kidneys, and adrenal glands appear normal.  Normal appendix. Remainder of stomach unremarkable. Bowel loops grossly unremarkable. Normal appearing bladder, ureters, uterus and adnexa. No mass, adenopathy, free air, free fluid, or inflammatory process. Bones demineralized. Review of the MIP images confirms the above findings. IMPRESSION: Scattered atherosclerotic calcifications without evidence of aortic aneurysm or dissection. Subsegmental atelectasis BILATERAL lower lobes. Cholelithiasis. Electronically Signed   By: Ulyses Southward M.D.   On: 08/05/2015 17:57    Microbiology: Recent Results (from the past 240 hour(s))  Culture, blood (routine x 2)     Status: None (Preliminary result)   Collection Time: 08/05/15  6:00 PM  Result Value Ref Range Status   Specimen Description BLOOD LEFT WRIST  Final   Special Requests BOTTLES DRAWN AEROBIC AND ANAEROBIC 5CC  Final   Culture NO GROWTH 3 DAYS  Final   Report Status PENDING  Incomplete  Culture, blood (routine x 2)     Status: None (Preliminary result)   Collection Time: 08/05/15  6:07 PM  Result Value Ref Range Status   Specimen Description BLOOD LEFT THUMB  Final   Special Requests BOTTLES DRAWN AEROBIC ONLY 3CC  Final   Culture NO GROWTH 3 DAYS  Final   Report Status PENDING  Incomplete  Urine culture     Status: None   Collection Time: 08/05/15  6:56 PM  Result Value Ref Range Status   Specimen Description URINE, CLEAN CATCH  Final   Special Requests NONE  Final   Culture 6,000 COLONIES/mL INSIGNIFICANT GROWTH  Final   Report Status 08/06/2015 FINAL  Final  C difficile quick scan w PCR reflex     Status: None   Collection Time: 08/06/15  2:22 AM  Result Value Ref Range Status   C Diff antigen NEGATIVE NEGATIVE Final   C Diff toxin NEGATIVE NEGATIVE Final   C Diff interpretation Negative for toxigenic C. difficile  Final      Labs: Basic Metabolic Panel:  Recent Labs Lab 08/05/15 1548 08/05/15 1559 08/06/15 0255 08/06/15 0354 08/07/15 0825 08/08/15 0534  NA 141 139  --  139 143 144  K 4.5 3.5  --  3.5 5.1 3.7  CL 103 103  --  108 112* 110  CO2  --  22  --  22 21* 24  GLUCOSE 235* 225*  --  161* 102* 92  BUN 34* 21*  --  20 15 11   CREATININE 0.90 1.17* 0.90 0.97 0.88 0.86  CALCIUM  --  9.0  --  7.7* 8.0* 8.5*  MG  --   --  1.7  --   --   --   PHOS  --   --  2.4*  --   --   --    Liver Function Tests:  Recent Labs Lab 08/05/15 1559 08/06/15 0354 08/07/15 0825  AST 20 19 36  ALT 15 13* 10*  ALKPHOS 124 94 86  BILITOT 0.5 0.3 1.1  PROT 6.4* 5.2* 5.3*  ALBUMIN 3.3* 2.8* 2.6*   No results for input(s): LIPASE, AMYLASE in the last 168 hours. No results for input(s): AMMONIA in the last 168 hours. CBC:  Recent Labs Lab 08/05/15 1559 08/06/15 0354 08/06/15 0804 08/07/15 0825 08/08/15 0534  WBC 11.2* 13.6* 12.9* 12.3* 7.7  NEUTROABS 7.9* 11.4*  --  7.6 5.7  HGB 13.0 12.2 11.9* 12.1 11.4*  HCT 40.9 37.5 36.7 35.5* 35.0*  MCV 97.4 97.7 97.3 96.5 97.2  PLT 190 150 152 PLATELET CLUMPS NOTED ON SMEAR, COUNT APPEARS ADEQUATE 138*  Cardiac Enzymes:  Recent Labs Lab 08/06/15 0255 08/06/15 0804  TROPONINI <0.03 <0.03   BNP: BNP (last 3 results)  Recent Labs  08/08/15 0534  BNP 224.6*    ProBNP (last 3 results) No results for input(s): PROBNP in the last 8760 hours.  CBG:  Recent Labs Lab 08/06/15 0840  GLUCAP 104*       Signed:  KRISHNAN,SENDIL K  [facility] 08/08/2015, 2:02 PM

## 2015-08-08 NOTE — Evaluation (Signed)
Physical Therapy Evaluation Patient Details Name: Leslie Chandler Fout MRN: [id number] DOB: October 15, 1934 Today's Date: 08/08/2015   History of Present Illness  Leslie Chandler Harcum is a 80 y.o. female with past medical history of epilepsy, mental retardation, hypertension, asthma, osteoporosis who was brought to the emergency department via EMS after being found flaccid on her left side and aphasic as per her home health caregivers. By the time the patient arrived to the hospital, her symptoms were improved. The patient was noticed to have brown smearing around her mouth, suspicious for emesis. Workup for sepsis with asp. PNA.  Clinical Impression  Pt is at or close to baseline functioning and should be safe at her group home where staff knows her. There are no further acute PT needs.  Will sign off at this time.     Follow Up Recommendations No PT follow up;Supervision/Assistance - 24 hour    Equipment Recommendations  None recommended by PT    Recommendations for Other Services       Precautions / Restrictions Precautions Precautions: Fall      Mobility  Bed Mobility Overal bed mobility: Needs Assistance Bed Mobility: Supine to Sit;Sit to Supine     Supine to sit: Mod assist Sit to supine: Mod assist;Min assist   General bed mobility comments: pt tries to assist as much as possible.  Follows cues with extra time  Transfers Overall transfer level: Needs assistance Equipment used: 1 person hand held assist;2 person hand held assist Transfers: Sit to/from [employer]Stand;Stand Pivot Transfers Sit to Stand: Mod assist Stand pivot transfers: Mod assist       General transfer comment: After feet are sufficiently blocked and pt assisted forward, she come up with light mod assist and maintains standing with min assist  Ambulation/Gait                Stairs            Wheelchair Mobility    Modified Rankin (Stroke Patients Only)       Balance Overall balance assessment: Needs  assistance Sitting-balance support: Bilateral upper extremity supported Sitting balance-Leahy Scale: Poor Sitting balance - Comments: falls back without light assist     Standing balance-Leahy Scale: Poor Standing balance comment: needs stability support                             Pertinent Vitals/Pain Pain Assessment: Faces Faces Pain Scale: No hurt    Home Living Family/patient expects to be discharged to:: Group home                 Additional Comments: Transfers bed to bsc or w/c with 1 person assist usually.  Stays in w/c day at senior day care.    Prior Function                 Hand Dominance        Extremity/Trunk Assessment               Lower Extremity Assessment: Generalized weakness (decreased coordination)         Communication      Cognition Arousal/Alertness: Awake/alert Behavior During Therapy: WFL for tasks assessed/performed Overall Cognitive Status: History of cognitive impairments - at baseline                      General Comments      Exercises        Assessment/Plan  PT Assessment Patent does not need any further PT services  PT Diagnosis Generalized weakness   PT Problem List    PT Treatment Interventions     PT Goals (Current goals can be found in the Care Plan section) Acute Rehab PT Goals PT Goal Formulation: All assessment and education complete, DC therapy    Frequency     Barriers to discharge        Co-evaluation               End of Session   Activity Tolerance: Patient tolerated treatment well Patient left: in bed;with call bell/phone within reach;with family/visitor present Nurse Communication: Mobility status         Time: 1610-9604 PT Time Calculation (min) (ACUTE ONLY): 28 min   Charges:   PT Evaluation $Initial PT Evaluation Tier I: 1 Procedure PT Treatments $Therapeutic Activity: 8-22 mins   PT G Codes:        Mathew Storck, Eliseo Gum 08/08/2015,  2:36 PM  08/08/2015  [place] Bing, PT [phone]  (pager)

## 2015-08-08 NOTE — Clinical Social Work Note (Signed)
CSW spoke to [name] at group home, who stated they will provide transportation for patient back to group home.  CSW notified [name] that patient has discharge order from physician, they will be at hospital around 2:45pm.  CSW to sign off, please reconsult if social work needs arise.  [name], MSW, [name] [phone] 08/08/2015 2:19 PM

## 2015-08-08 NOTE — Progress Notes (Signed)
Speech Language Pathology Treatment: Dysphagia  Patient Details Name: Leslie Chandler MRN: [id number] DOB: 06/11/1935 Today's Date: 08/08/2015 Time: [id number] SLP Time Calculation (min) (ACUTE ONLY): 18 min  Assessment / Plan / Recommendation Clinical Impression  F/u diet tolerance assessment complete with additional focus on family education. Sister present and supportive. Able to verbalize understanding of education provided regarding MBS results from 12/20 as well as diet recommendations and aspiration precautions. Po trials provided by SLP without overt indication of aspiration with SLP providing max tactile cueing for small controlled single sips of thin liquid via straw to decrease risk of aspiration. Overall, patient appearing to be protecting airway well despite baseline dysphagia. Although risk remains present, current diet remains appropriate. Recommend continued SLP f/u for tolerance and education prior to d/c as well as brief HH f/u after d/c to maximize education with staff at group home to decrease aspiration risk.    HPI HPI: Leslie Chandler is a 80 y.o. female with past medical history of epilepsy, mental retardation, hypertension, asthma, osteoporosis who was brought to the emergency department via EMS after being found flaccid on her left side and aphasic as per her home health caregivers. By the time the patient arrived to the hospital, her symptoms were improved. The patient was noticed to have brown smearing around her mouth, suspicious for emesis. Per patient's sister, she was doing well in the morning, went to church with them, having lunch and then returned home. She does not remember seeing anything unusual on the patient's behavior or any symptoms.       SLP Plan  Continue with current plan of care     Recommendations  Diet recommendations: Dysphagia 2 (fine chop);Thin liquid Liquids provided via: Straw Medication Administration: Crushed with puree Supervision: Patient able to  self feed;Full supervision/cueing for compensatory strategies;Staff to assist with self feeding Compensations: Minimize environmental distractions;Slow rate;Small sips/bites;Multiple dry swallows after each bite/sip Postural Changes and/or Swallow Maneuvers: Seated upright 90 degrees              Oral Care Recommendations: Oral care BID Follow up Recommendations: Home health SLP Plan: Continue with current plan of care  [facility] Cent[facility] Terria Deschepper MA, CCC-SLP [phone]  Leslie Chandler 08/08/2015, 8:48 AM

## 2015-08-08 NOTE — Care Management Note (Signed)
Case Management Note  Patient Details  Name: Leslie Chandler MRN: [id number] Date of Birth: 05-25-1935  Subjective/Objective:      Patient is for dc to group home today, CSW following.              Action/Plan:   Expected Discharge Date:                  Expected Discharge Plan:  Group Home  In-House Referral:  Clinical Social Work  Discharge planning Services  CM Consult  Post Acute Care Choice:    Choice offered to:     DME Arranged:    DME Agency:     HH Arranged:    HH Agency:     Status of Service:  Completed, signed off  Medicare Important Message Given:    Date Medicare IM Given:    Medicare IM give by:    Date Additional Medicare IM Given:    Additional Medicare Important Message give by:     If discussed at Long Length of Stay Meetings, dates discussed:    Additional Comments:  Leone Havenaylor, Tamaria Dunleavy Clinton, RN 08/08/2015, 2:49 PM

## 2015-08-08 NOTE — Discharge Instructions (Addendum)
Dysphagia Diet Level 2, Mechanically Altered The dysphagia level 2 diet includes foods that are blended, chopped, ground, or mashed so they are easier to chew and swallow. The foods are soft, moist, and can be chopped into -inch chunks (such as pancakes, pasta, and bananas). In order to be on this diet, you must be able to chew. This diet helps you transition between the pureed textures of the dysphagia level 1 diet to more solid textures. This diet is helpful for people with mild to moderate swallowing difficulties. It reduces the risk of food getting caught in the windpipe, trachea, or lungs.  You may need help or supervision during meals while following this diet so that you eat safely. You will be on this diet until your health care provider advances the texture of your diet.  WHAT DO I NEED TO KNOW ABOUT THIS DIET? Foods  You may eat foods that are soft and moist.  You may need to use a blender, whisk, or masher to soften some of your foods.  You can moisten foods with gravies, sauces, vegetable or fruit juice, milk, half and half, or water when blending, mashing, or grinding your foods to the right consistency.  If you were on the dysphagia level 1 diet, you may still eat any of the foods included in that diet.  Avoid foods that are dry, hard, sticky, chewy, coarse, and crunchy. Also avoid large cuts of food.  Take small bites. Each bite should contain  inch or less of food. Liquids  Avoid liquids with seeds and chunks.  Thicken liquids, if instructed by your health care provider. Your health care provider will tell you the consistency to which you should thicken your liquids for safe swallowing. To thicken a liquid, use a commercial thickener or a thickening food (such as rice cereal or potato flakes). Ask your health care provider for specific recommendations on thickeners. See your dietitian or health care provider regularly for help with your dietary changes. WHAT FOODS CAN I  EAT? Grains Store-bought soft breads that do not have nuts or seeds. Pancakes, sweet rolls, [place] pastries, and [place] toast that have been moistened with syrup or sauce to form a slurry when blended. Well-cooked pasta, noodles, and bread dressing. Well-cooked noodles and pasta in sauce. Moist macaroni and cheese. Soft dumplings or spaetzle with gravy or butter. Cooked cereals (including oatmeal). Low-texture dry cereals, such as rice puff, corn, or wheat-flake cereals, with milk (if thin liquids are not allowed, make sure all of the milk is absorbed by the cereal before eating it). Vegetables Very soft, well-cooked vegetables in pieces less than  inch in size. Cooked potatoes that are moist, not crispy, and with sauce. Fruits Canned or cooked fruits that are soft or moist and do not have skin or seeds. Fresh, soft bananas. Fruit juices with a small amount of pulp (if thin liquids are allowed). Gelatin or plain gelatin with canned fruit, except pineapple. Meat and Other Protein Sources Tender, moist meats, poultry, or fish cooked with gravy or sauce and cubed to -inch bites or smaller. Ground meat. Moist meatball or meatloaf. Fish without bones. Moist casseroles without rice. Tuna, egg, or meat salad without chunks or hard-to-chew vegetables, such as celery and onions. Smooth quiche without large chunks. Scrambled, poached, or soft-cooked eggs with butter, margarine, sauce, or gravy. Tofu. Well-cooked, moistened and mashed beans, peas, baked beans, and other legumes. Casseroles without rice (such as tuna noodle casserole or soft moist meat lasagna). Dairy Cream cheese.  Yogurt. Cottage cheese. Ask your health care provider if milk is allowed. Sweets/Desserts Pudding. Custard. Soft fruit pies with crust on the bottom only. Crisps and cobblers without seeds or nuts and with soft crusts. Soft, moist cakes. Icing. Pre-gelled cookies. Soft, moist cookies dunked in milk, coffee, or another liquid. Jelly.  Soft, smooth chocolate bars that are easily chewed. Jams and preserves without seeds. Ask your health care provider whether you can have frozen desserts. Fats and Oils Butter. Margarine. Cream for cereal, depending on liquid consistency allowed. Gravy. Cream sauces. Mayonnaise. Salad dressings. Cream cheese. Cheese spreads, plain or with soft fruits or vegetables added. Sour cream. Sour cream dips with soft fruits or vegetables added. Whipped toppings. Other Sauces and salsas that have soft chunks that are about  inch or smaller. The items listed above may not be a complete list of recommended foods or beverages. Contact your dietitian for more options. WHAT FOODS ARE NOT RECOMMENDED? Grains All breads not listed in the recommended list. Breads that are hard or have nuts or seeds. Coarse cereals. Cereals that have nuts, seeds, dried fruits, or coconut. Rice. Corn. Vegetables Whole, raw, frozen, or dried vegetables. Tough, fibrous, chewy, or stringy cooked vegetables, such as celery, peas, broccoli, cabbage, Brussels sprouts, and asparagus. Potato skins. Potato and other vegetable chips. Fried or French-fried potatoes. Cooked corn and peas. Fruits Whole raw, frozen, or dried fruits, including coconut. Pineapple. Fruits with seeds. Meat and Other Protein Sources Dry, tough meats, such as bacon, sausage, and hot dogs. Cheese slices and cubes. Peanut butter. Hard boiled or fried eggs. Nuts. Seeds. Pizza. Sandwiches. Dry casseroles or casseroles with rice or large chunks. Dairy Yogurt with nuts, seeds, or large chunks. Sweets/Desserts Coarse, hard, chewy, or sticky desserts. Any dessert with nuts, seeds, coconut, pineapple, or dried fruit. Ask your health care provider whether you can have frozen desserts. Fats and Oils Avoid fats with chunky, large textures, such as those with nuts or fruits. Other Soups and casseroles with large chunks. The items listed above may not be a complete list of foods  and beverages to avoid. Contact your dietitian for more information.   This information is not intended to replace advice given to you by your health care provider. Make sure you discuss any questions you have with your health care provider.   Document Released: 08/04/2005 Document Revised: 08/25/2014 Document Reviewed: 07/18/2013 Elsevier Interactive Patient Education 2016 [employer] ----------------------------------------------------------------------------------------------------------------------------------------------------------------------------------------------------------------------------------------------------------------------------------------------------------------------------- Levofloxacin (Levaquin) Drug Information  Brand Names: [place]  Levaquin Warning  This drug may cause very bad side effects. These include irritated or torn tendons; nerve problems in the arms, hands, legs, or feet; and nervous system problems. These side effects can happen alone or at the same time. They can happen within hours to weeks after starting this drug. Some of these side effects may not go away, and may lead to disability or death. Talk with the doctor.  The chance of irritated or torn tendons is greater in people over the age of 78; heart, kidney, or lung transplant patients; or people taking steroid drugs. Tendon problems can happen as long as several months after treatment. Call your doctor right away if you have pain, bruising, or swelling in the back of the ankle, shoulder, hand, or other joints. Call you doctor right away if you are not able to move or bear weight on a joint or if you hear or feel a snap or pop.  Call your doctor right away if you have signs of nerve problems.  These may include not being able to handle heat or cold; change in sense of touch; or burning, numbness, tingling, pain, or weakness in the arms, hands, legs, or feet.  Call your doctor right away if you have signs of  nervous system problems. These may include anxiety, bad dreams, trouble sleeping, change in eyesight, dizziness, feeling confused, feeling nervous or agitated, feeling restless, hallucinations (seeing or hearing things that are not there), new or worse behavior or mood changes like depression or thoughts of killing yourself, seizures, or very bad headaches.  Do not take if you have myasthenia gravis. Very bad and sometimes deadly breathing problems have happened with this drug in people who have myasthenia gravis.  For some health problems, this drug is only for use when other drugs cannot be used or have not worked. Talk with the doctor to be sure that the benefits of this drug are more than the risks.  What is this drug used for?  It is used to treat bacterial infections.  What do I need to tell my doctor BEFORE I take this drug?  If you have an allergy to levofloxacin or any other part of this drug.  If you are allergic to any drugs like this one, any other drugs, foods, or other substances. Tell your doctor about the allergy and what signs you had, like rash; hives; itching; shortness of breath; wheezing; cough; swelling of face, lips, tongue, or throat; or any other signs.  If you have any of these health problems: Long QT on ECG, low magnesium levels, or low potassium levels.  If you have ever had any of these health problems: Nerve problems or tendon problems.  If you have had tendons get irritated or torn when taking this drug or an alike drug in the past.  If you have been taking any drugs to treat a heartbeat that is not normal.  If you are taking any drugs that can cause a certain type of heartbeat that is not normal (prolonged QT interval). There are many drugs that can do this. Ask your doctor or pharmacist if you are not sure.  If you are breast-feeding or plan to breast-feed.  This is not a list of all drugs or health problems that interact with this drug.  Tell your doctor and  pharmacist about all of your drugs (prescription or OTC, natural products, vitamins) and health problems. You must check to make sure that it is safe for you to take this drug with all of your drugs and health problems. Do not start, stop, or change the dose of any drug without checking with your doctor.  What are some things I need to know or do while I take this drug?  Tell all of your health care providers that you take this drug. This includes your doctors, nurses, pharmacists, and dentists.  Avoid driving and doing other tasks or actions that call for you to be alert until you see how this drug affects you.  This drug may affect certain lab tests. Tell all of your health care providers and lab workers that you take this drug.   If you have high blood sugar (diabetes), you will need to watch your blood sugar closely.  Tell your doctor if you have signs of high or low blood sugar like breath that smells like fruit, dizziness, fast breathing, fast heartbeat, feeling confused, feeling sleepy, feeling weak, flushing, headache, more thirsty or hungry, passing urine more often, shaking, or sweating.  Have blood work checked as you have been told by the doctor. Talk with the doctor.   This drug may affect how much of some other drugs are in your body. If you are taking other drugs, talk with your doctor. You may need to have your blood work checked more closely while taking this drug with your other drugs.  Do not use longer than you have been told. A second infection may happen.   You may get sunburned more easily. Avoid sun, sunlamps, and tanning beds. Use sunscreen and wear clothing and eyewear that protects you from the sun.  Drink lots of noncaffeine liquids after using this drug unless told to drink less liquid by your doctor.  Very bad and sometimes deadly allergic side effects have rarely happened. Talk with your doctor.  Rarely, very bad and sometimes deadly effects have happened with this drug.  These include muscle or joint, kidney, liver, blood, and other problems. Talk with the doctor if you have questions.   If you are over the age of 71, use this drug with care. You could have more side effects.  What are some side effects that I need to call my doctor about right away?  WARNING/CAUTION: Even though it may be rare, some people may have very bad and sometimes deadly side effects when taking a drug. Tell your doctor or get medical help right away if you have any of the following signs or symptoms that may be related to a very bad side effect:   Signs of an allergic reaction, like rash; hives; itching; red, swollen, blistered, or peeling skin with or without fever; wheezing; tightness in the chest or throat; trouble breathing or talking; unusual hoarseness; or swelling of the mouth, face, lips, tongue, or throat.   Signs of kidney problems like unable to pass urine, change in how much urine is passed, blood in the urine, or a big weight gain.   Chest pain or pressure, a fast heartbeat, or passing out.   A heartbeat that does not feel normal.   Feeling very tired or weak.   Shortness of breath.   Any unexplained bruising or bleeding.   Shakiness.   Trouble walking.   Vaginal itching or discharge.   White patches in mouth.   Fever or chills.   Ringing in ears.   Muscle pain or weakness.   Very bad and sometimes deadly liver problems have happened with this drug. Call your doctor right away if you have signs of liver problems like dark urine, feeling tired, not hungry, upset stomach or stomach pain, light-colored stools, throwing up, or yellow skin or eyes.   A very bad skin reaction (Stevens-Johnson syndrome/toxic epidermal necrolysis) may happen. It can cause very bad health problems that may not go away, and sometimes death. Get medical help right away if you have signs like red, swollen, blistered, or peeling skin (with or without fever); red or irritated eyes; or  sores in your mouth, throat, nose, or eyes.   It is common to have diarrhea when taking this drug. Rarely, a very bad form of diarrhea called Clostridium difficile (C diff)-associated diarrhea (CDAD) may occur. Sometimes, this has led to a deadly bowel problem (colitis). CDAD may happen while you are taking this drug or within a few months after you stop taking it. Call your doctor right away if you have stomach pain or cramps, very loose or watery stools, or bloody stools. Do not try to treat loose stools without  first checking with your doctor.  What are some other side effects of this drug?  All drugs may cause side effects. However, many people have no side effects or only have minor side effects. Call your doctor or get medical help if any of these side effects or any other side effects bother you or do not go away:   Upset stomach.   Loose stools (diarrhea).   Headache.  These are not all of the side effects that may occur. If you have questions about side effects, call your doctor. Call your doctor for medical advice about side effects.  You may report side effects to your national health agency  How is this drug best taken?  Tablets:   Take with or without food.  Liquid:   Take liquid (solution) on an empty stomach. Take 1 hour before or 2 hours after meals.   Measure liquid doses carefully. Use the measuring device that comes with this drug. If there is none, ask the pharmacist for a device to measure this drug.  Use this drug as ordered by your doctor. Read all information given to you. Follow all instructions closely.  All oral products:   Take this drug at the same time of day.   To gain the most benefit, do not miss doses.   Keep using this drug as you have been told by your doctor or other health care provider, even if you feel well.   Do not take dairy products, antacids, didanosine, sucralfate, multivitamins, or other products that contain calcium, magnesium,  aluminum, iron, or zinc within 2 hours before or 2 hours after taking this drug.  Injection:   It is given as an infusion into a vein over a period of time.  What do I do if I miss a dose?  All oral products:   Take a missed dose as soon as you think about it.   If it is close to the time for your next dose, skip the missed dose and go back to your normal time.   Do not take more than 1 dose of this drug in the same day.   How do I store and/or throw out this drug?  All oral products:   Store at room temperature.   Store in a dry place. Do not store in a bathroom.  All dose forms:   Keep all drugs in a safe place. Keep all drugs out of the reach of children and pets.   Check with your pharmacist about how to throw out unused drugs.   General drug facts   If your symptoms or health problems do not get better or if they become worse, call your doctor.   Do not share your drugs with others and do not take anyone else's drugs.   Keep a list of all your drugs (prescription, natural products, vitamins, OTC) with you. Give this list to your doctor.   Talk with the doctor before starting any new drug, including prescription or OTC, natural products, or vitamins.   Some drugs may have another patient information leaflet. If you have any questions about this drug, please talk with your doctor, nurse, pharmacist, or other health care provider.   If you think there has been an overdose, call your poison control center or get medical care right away. Be ready to tell or show what was taken, how much, and when it happened.  ----------------------------------------------------------------------------------------------------------------------------------------------------------------------------------------------------------------------------------------------------------------------------------------------------------------------------- Carvedilol (Coreg) Drug Information  Brand Names: [place]   Coreg;  Coreg CR  What is this drug used for?   It is used to treat heart failure (weak heart).   It is used to treat high blood pressure.   It is used after a heart attack to help prevent future heart attacks and lengthen life.   It may be given to you for other reasons. Talk with the doctor.  What do I need to tell my doctor BEFORE I take this drug?   If you have an allergy to carvedilol or any other part of this drug.   If you are allergic to any drugs like this one, any other drugs, foods, or other substances. Tell your doctor about the allergy and what signs you had, like rash; hives; itching; shortness of breath; wheezing; cough; swelling of face, lips, tongue, or throat; or any other signs.   If you have any of these health problems: Asthma, other lung or breathing problems that cause shortness of breath or wheezing, certain types of abnormal heartbeats called heart block or sick sinus syndrome, a slow heartbeat, or heart failure that is being treated with certain IV drugs.   If you have liver disease.  This is not a list of all drugs or health problems that interact with this drug.  Tell your doctor and pharmacist about all of your drugs (prescription or OTC, natural products, vitamins) and health problems. You must check to make sure that it is safe for you to take this drug with all of your drugs and health problems. Do not start, stop, or change the dose of any drug without checking with your doctor.  What are some things I need to know or do while I take this drug?   Tell all of your health care providers that you take this drug. This includes your doctors, nurses, pharmacists, and dentists.   Avoid driving and doing other tasks or actions that call for you to be alert until you see how this drug affects you.   To lower the chance of feeling dizzy or passing out, rise slowly over a few minutes when sitting or lying down. Be careful climbing stairs.   Check blood  pressure and heart rate as the doctor has told you. Talk with the doctor.   Have blood work checked as you have been told by the doctor. Talk with the doctor.   This drug may hide the signs of low blood sugar. Talk with the doctor.   If you have high blood sugar (diabetes), you will need to watch your blood sugar closely.   Do not stop taking this drug all of a sudden. If you do, chest pain that is worse and in some cases heart attack may occur. The risk may be greater if you have certain types of heart disease. To avoid side effects, you will want to slowly stop this drug as ordered by your doctor. Call your doctor right away if you have new or worse chest pain or if other heart problems occur.   This drug may make it harder to tell if you have signs of an overactive thyroid like fast heartbeat. If you have an overactive thyroid and stop taking this drug all of a sudden, it may get worse and could be life-threatening. Talk with your doctor.   If you have had a very bad allergic reaction, talk with your doctor. You may have a chance of an even worse reaction if you come into contact with what caused your allergy. If you use  epinephrine to treat very bad allergic reactions, talk with your doctor. Epinephrine may not work as well while you are taking this drug.   If you are having cataract surgery or other eye procedure, talk with your doctor.   If you are taking this drug and have high blood pressure, talk with your doctor before using OTC products that may raise blood pressure. These include cough or cold drugs, diet pills, stimulants, ibuprofen or like products, and some natural products or aids.   If you are 20 or older, use this drug with care. You could have more side effects.   What are some side effects that I need to call my doctor about right away?  WARNING/CAUTION: Even though it may be rare, some people may have very bad and sometimes deadly side effects when taking a drug. Tell your  doctor or get medical help right away if you have any of the following signs or symptoms that may be related to a very bad side effect:   Signs of an allergic reaction, like rash; hives; itching; red, swollen, blistered, or peeling skin with or without fever; wheezing; tightness in the chest or throat; trouble breathing or talking; unusual hoarseness; or swelling of the mouth, face, lips, tongue, or throat.   Very bad dizziness or passing out.   Not able to pass urine or change in how much urine is passed.   Chest pain.   Slow heartbeat.   Change in eyesight.   Heart failure has happened with this drug, as well as heart failure that has gotten worse in people who already have it. Tell your doctor if you have heart disease. Call your doctor right away if you have shortness of breath, a big weight gain, a heartbeat that is not normal, or swelling in the arms or legs that is new or worse.  What are some other side effects of this drug?  All drugs may cause side effects. However, many people have no side effects or only have minor side effects. Call your doctor or get medical help if any of these side effects or any other side effects bother you or do not go away:   Dizziness.   Feeling tired or weak.   Loose stools (diarrhea).   Headache.   Upset stomach or throwing up.   Weight gain.   Joint pain.   If you wear contact lenses, you may have fewer tears or dry eyes. Call your doctor if this bothers you.  These are not all of the side effects that may occur. If you have questions about side effects, call your doctor. Call your doctor for medical advice about side effects.  You may report side effects to your national health agency.  How is this drug best taken?  Use this drug as ordered by your doctor. Read all information given to you. Follow all instructions closely.  All products:   Keep taking this drug as you have been told by your doctor or other health care provider, even  if you feel well.   Take this drug at the same time of day.   To gain the most benefit, do not miss doses.   Take this drug with food.  Long-acting capsules:   Swallow whole. Do not chew, break, or crush.   If you cannot swallow this drug whole, you may sprinkle the contents on applesauce. If you do this, swallow the mixture right away without chewing.  What do I do if I miss  a dose?   Take a missed dose as soon as you think about it.   If it is close to the time for your next dose, skip the missed dose and go back to your normal time.   Do not take 2 doses at the same time or extra doses.  How do I store and/or throw out this drug?   Store at room temperature.   Protect from light.   Store in a dry place. Do not store in a bathroom.   Keep all drugs in a safe place. Keep all drugs out of the reach of children and pets.   Check with your pharmacist about how to throw out unused drugs.   General drug facts   If your symptoms or health problems do not get better or if they become worse, call your doctor.   Do not share your drugs with others and do not take anyone else's drugs.   Keep a list of all your drugs (prescription, natural products, vitamins, OTC) with you. Give this list to your doctor.   Talk with the doctor before starting any new drug, including prescription or OTC, natural products, or vitamins.   Some drugs may have another patient information leaflet. If you have any questions about this drug, please talk with your doctor, nurse, pharmacist, or other health care provider.   If you think there has been an overdose, call your poison control center or get medical care right away. Be ready to tell or show what was taken, how much, and when it happened.

## 2015-08-10 LAB — CULTURE, BLOOD (ROUTINE X 2)
Culture: NO GROWTH
Culture: NO GROWTH

## 2015-12-14 ENCOUNTER — Other Ambulatory Visit: Payer: Self-pay

## 2015-12-14 DIAGNOSIS — Z1231 Encounter for screening mammogram for malignant neoplasm of breast: Secondary | ICD-10-CM

## 2016-01-16 ENCOUNTER — Ambulatory Visit: Payer: Medicare Other

## 2016-03-06 ENCOUNTER — Ambulatory Visit: Payer: Medicare Other

## 2016-04-09 ENCOUNTER — Ambulatory Visit
Admission: RE | Admit: 2016-04-09 | Discharge: 2016-04-09 | Disposition: A | Discharge status: Home / Self Care | Payer: Medicare Other | Source: Ambulatory Visit

## 2016-04-09 DIAGNOSIS — Z1231 Encounter for screening mammogram for malignant neoplasm of breast: Secondary | ICD-10-CM

## 2016-09-01 ENCOUNTER — Encounter (HOSPITAL_COMMUNITY): Payer: Self-pay | Admitting: Emergency Medicine

## 2016-09-01 ENCOUNTER — Emergency Department (HOSPITAL_COMMUNITY)
Admission: EM | Admit: 2016-09-01 | Discharge: 2016-09-01 | Disposition: A | Discharge status: Home / Self Care | Payer: Medicare Other | Attending: Emergency Medicine | Admitting: Emergency Medicine

## 2016-09-01 DIAGNOSIS — Z79899 Other long term (current) drug therapy: Secondary | ICD-10-CM | POA: Insufficient documentation

## 2016-09-01 DIAGNOSIS — J45909 Unspecified asthma, uncomplicated: Secondary | ICD-10-CM | POA: Diagnosis not present

## 2016-09-01 DIAGNOSIS — I5032 Chronic diastolic (congestive) heart failure: Secondary | ICD-10-CM | POA: Diagnosis not present

## 2016-09-01 DIAGNOSIS — I110 Hypertensive heart disease with heart failure: Secondary | ICD-10-CM | POA: Insufficient documentation

## 2016-09-01 DIAGNOSIS — Z7982 Long term (current) use of aspirin: Secondary | ICD-10-CM | POA: Diagnosis not present

## 2016-09-01 DIAGNOSIS — E860 Dehydration: Secondary | ICD-10-CM | POA: Insufficient documentation

## 2016-09-01 DIAGNOSIS — E119 Type 2 diabetes mellitus without complications: Secondary | ICD-10-CM | POA: Insufficient documentation

## 2016-09-01 DIAGNOSIS — R197 Diarrhea, unspecified: Secondary | ICD-10-CM

## 2016-09-01 LAB — CBC WITH DIFFERENTIAL/PLATELET
BASOS ABS: 0 10*3/uL (ref 0.0–0.1)
BASOS PCT: 0 %
EOS PCT: 0 %
Eosinophils Absolute: 0.1 10*3/uL (ref 0.0–0.7)
HCT: 38.3 % (ref 36.0–46.0)
Hemoglobin: 12.4 g/dL (ref 12.0–15.0)
LYMPHS PCT: 8 %
Lymphs Abs: 1.2 10*3/uL (ref 0.7–4.0)
MCH: 31.4 pg (ref 26.0–34.0)
MCHC: 32.4 g/dL (ref 30.0–36.0)
MCV: 97 fL (ref 78.0–100.0)
Monocytes Absolute: 1.2 10*3/uL — ABNORMAL HIGH (ref 0.1–1.0)
Monocytes Relative: 8 %
Neutro Abs: 12.8 10*3/uL — ABNORMAL HIGH (ref 1.7–7.7)
Neutrophils Relative %: 84 %
PLATELETS: 153 10*3/uL (ref 150–400)
RBC: 3.95 MIL/uL (ref 3.87–5.11)
RDW: 13.1 % (ref 11.5–15.5)
WBC: 15.3 10*3/uL — AB (ref 4.0–10.5)

## 2016-09-01 LAB — COMPREHENSIVE METABOLIC PANEL
ALBUMIN: 3.1 g/dL — AB (ref 3.5–5.0)
ALK PHOS: 107 U/L (ref 38–126)
ALT: 13 U/L — ABNORMAL LOW (ref 14–54)
ANION GAP: 10 (ref 5–15)
AST: 18 U/L (ref 15–41)
BUN: 46 mg/dL — ABNORMAL HIGH (ref 6–20)
CHLORIDE: 108 mmol/L (ref 101–111)
CO2: 24 mmol/L (ref 22–32)
Calcium: 8.6 mg/dL — ABNORMAL LOW (ref 8.9–10.3)
Creatinine, Ser: 1.39 mg/dL — ABNORMAL HIGH (ref 0.44–1.00)
GFR calc non Af Amer: 35 mL/min — ABNORMAL LOW (ref >60)
GFR, EST AFRICAN AMERICAN: 41 mL/min — AB (ref >60)
GLUCOSE: 150 mg/dL — AB (ref 65–99)
POTASSIUM: 4 mmol/L (ref 3.5–5.1)
SODIUM: 142 mmol/L (ref 135–145)
Total Bilirubin: 0.4 mg/dL (ref 0.3–1.2)
Total Protein: 6.2 g/dL — ABNORMAL LOW (ref 6.5–8.1)

## 2016-09-01 LAB — TYPE AND SCREEN
ABO/RH(D): A POS
ANTIBODY SCREEN: NEGATIVE

## 2016-09-01 LAB — ABO/RH: ABO/RH(D): A POS

## 2016-09-01 LAB — POC OCCULT BLOOD, ED: FECAL OCCULT BLD: NEGATIVE

## 2016-09-01 LAB — I-STAT TROPONIN, ED
TROPONIN I, POC: 0.01 ng/mL (ref 0.00–0.08)
Troponin i, poc: 0 ng/mL (ref 0.00–0.08)

## 2016-09-01 MED ORDER — SODIUM CHLORIDE 0.9 % IV BOLUS (SEPSIS)
500.0000 mL | Freq: Once | INTRAVENOUS | Status: AC
Start: 2016-09-01 — End: 2016-09-01
  Administered 2016-09-01: 500 mL via INTRAVENOUS

## 2016-09-01 MED ORDER — SODIUM CHLORIDE 0.9 % IV SOLN
INTRAVENOUS | Status: DC
Start: 2016-09-01 — End: 2016-09-01

## 2016-09-01 NOTE — Discharge Instructions (Signed)
Need to hold carvedilol tonight and check blood pressure tomorrow morning before given lisinopril/hctz.  Only given if blood pressure is greater than 120/70

## 2016-09-01 NOTE — ED Notes (Signed)
Pt being discharged, [facility] called to transport pt home per family request.

## 2016-09-01 NOTE — ED Triage Notes (Signed)
To ED via [facility]--- with c/o hypotension and blood diarrhea-- pt is alert/oriented on arrival to ED, pt is at baseline. Pt has hx of MR-- caregivers here at bedside. Pt is incontinent of black stool.

## 2016-09-01 NOTE — ED Notes (Signed)
Call from lab-- CBC clotted--- will need to be redrawn.

## 2016-09-01 NOTE — ED Provider Notes (Signed)
[facility]-EMERGENCY DEPT Provider Note   CSN: [id number] Arrival date & time: 09/01/16  1318     History   Chief Complaint Chief Complaint  Patient presents with  . Hypotension  . Diarrhea    HPI Leslie Chandler is a 82 y.o. female.  Patient is a 82 year old female with a history of developmental delay, hypertension and chronic diastolic heart failure presenting today from urgent care for hypotension and black stool. Patient's caregiver states this morning she started to complain of lower abdominal pain and had one black stool. She has never had anything like this before and takes no anticoagulation. They went to urgent care and at that time she was not acting herself. She was less talkative and look like she might pass out. When they attempted to get her blood pressure there they were unable to measure her blood pressure. EMS said the only pressure they got was 60 systolic. Prior to arrival here EMS got a blood pressure of 130s systolic. Patient currently denies any abdominal pain however she did have a bowel movement on herself which is black in appearance. She has no complaint of shortness of breath, chest pain or URI symptoms. Caregiver states she's taken her home medications this morning but seems to be back to her normal state of health.   The history is provided by the patient and a caregiver.  Diarrhea   This is a new problem. The current episode started 1 to 2 hours ago. Episode frequency: one black stool this morning and lower abd pain. The problem has been gradually improving. Diarrhea characteristics: black. There has been no fever. Associated symptoms include abdominal pain. Pertinent negatives include no vomiting, no URI and no cough. She has tried nothing for the symptoms. The treatment provided significant relief.    Past Medical History:  Diagnosis Date  . Asthma   . Cognitive developmental delay   . Epilepsia (HCC)   . Hypertension   . Mental retardation   . Osteoporosis      Patient Active Problem List   Diagnosis Date Noted  . Chronic diastolic heart failure (HCC) 08/08/2015  . Aspiration pneumonia (HCC) 08/06/2015  . Sepsis (HCC) 08/05/2015  . Diarrhea 08/05/2015  . Type 2 diabetes mellitus (HCC) 08/05/2015  . Lactic acidosis 08/05/2015  . CAP (community acquired pneumonia) 04/12/2014  . Mental retardation   . Hypertension   . Asthma   . HCAP (healthcare-associated pneumonia) 04/11/2014    Past Surgical History:  Procedure Laterality Date  . BREAST RECONSTRUCTION    . DENTAL SURGERY      OB History    No data available       Home Medications    Prior to Admission medications   Medication Sig Start Date End Date Taking? Authorizing Provider  albuterol (PROVENTIL HFA;VENTOLIN HFA) 108 (90 BASE) MCG/ACT inhaler Inhale into the lungs every 6 (six) hours as needed for wheezing or shortness of breath.    Historical Provider, MD  albuterol (PROVENTIL) (2.5 MG/3ML) 0.083% nebulizer solution Take 2.5 mg by nebulization every 6 (six) hours as needed for wheezing or shortness of breath.    Historical Provider, MD  aspirin 81 MG chewable tablet Chew by mouth daily.    Historical Provider, MD  carvedilol (COREG) 3.125 MG tablet Take 1 tablet (3.125 mg total) by mouth 2 (two) times daily with a meal. 08/08/15   Hollice Espy, MD  ferrous sulfate 220 (44 FE) MG/5ML solution Take 220 mg by mouth daily.    Historical  Provider, MD  fluticasone (FLOVENT HFA) 110 MCG/ACT inhaler Inhale 2 puffs into the lungs 2 (two) times daily.    Historical Provider, MD  levothyroxine (SYNTHROID, LEVOTHROID) 88 MCG tablet Take 88 mcg by mouth daily before breakfast.    Historical Provider, MD  lisinopril-hydrochlorothiazide (PRINZIDE,ZESTORETIC) 10-12.5 MG per tablet Take 1 tablet by mouth daily.    Historical Provider, MD  montelukast (SINGULAIR) 10 MG tablet Take 10 mg by mouth at bedtime.    Historical Provider, MD  Multiple Minerals-Vitamins (CALCIUM & VIT D3 BONE  HEALTH) LIQD Take 3 mLs by mouth daily.     Historical Provider, MD  Multiple Vitamins-Calcium (VIACTIV MULTI-VITAMIN) CHEW Chew 1 tablet by mouth daily.    Historical Provider, MD  phenytoin (DILANTIN) 125 MG/5ML suspension Take 125 mg by mouth 2 (two) times daily.    Historical Provider, MD  raloxifene (EVISTA) 60 MG tablet Take 60 mg by mouth daily.    Historical Provider, MD  Rectal Protectant-Emollient (HEMORRHOIDAL) OINT Place 1 application rectally 2 (two) times daily as needed (hemorrhoids).    Historical Provider, MD    Family History No family history on file.  Social History Social History  Substance Use Topics  . Smoking status: Never Smoker  . Smokeless tobacco: Never Used  . Alcohol use No     Allergies   Patient has no known allergies.   Review of Systems Review of Systems  Respiratory: Negative for cough.   Gastrointestinal: Positive for abdominal pain and diarrhea. Negative for vomiting.  All other systems reviewed and are negative.    Physical Exam Updated Vital Signs BP 93/82 (BP Location: Right Arm)   Pulse 80   Temp 97.6 F (36.4 C) (Oral)   Resp 14   Wt 175 lb (79.4 kg)   SpO2 96%   BMI 39.23 kg/m   Physical Exam  Constitutional: She appears well-developed and well-nourished. No distress.  HENT:  Head: Normocephalic and atraumatic.  Mouth/Throat: Oropharynx is clear and moist.  Eyes: Conjunctivae and EOM are normal. Pupils are equal, round, and reactive to light.  Neck: Normal range of motion. Neck supple.  Cardiovascular: Normal rate, regular rhythm and intact distal pulses.   No murmur heard. Pulmonary/Chest: Effort normal and breath sounds normal. No respiratory distress. She has no wheezes. She has no rales.  Abdominal: Soft. She exhibits no distension. There is no tenderness. There is no rebound and no guarding.  No reproducible abdominal pain at this time  Genitourinary:  Genitourinary Comments: Black stool present  Musculoskeletal:  Normal range of motion. She exhibits no edema or tenderness.  Neurological: She is alert. She has normal strength. No sensory deficit.  MR but can answer simple questions and knows who she is.  Skin: Skin is warm and dry. Capillary refill takes 2 to 3 seconds. No rash noted. No erythema.  Psychiatric: She has a normal mood and affect. Her behavior is normal.  Nursing note and vitals reviewed.    ED Treatments / Results  Labs (all labs ordered are listed, but only abnormal results are displayed) Labs Reviewed  CBC WITH DIFFERENTIAL/PLATELET - Abnormal; Notable for the following:       Result Value   WBC 15.3 (*)    Neutro Abs 12.8 (*)    Monocytes Absolute 1.2 (*)    All other components within normal limits  COMPREHENSIVE METABOLIC PANEL - Abnormal; Notable for the following:    Glucose, Bld 150 (*)    BUN 46 (*)  Creatinine, Ser 1.39 (*)    Calcium 8.6 (*)    Total Protein 6.2 (*)    Albumin 3.1 (*)    ALT 13 (*)    GFR calc non Af Amer 35 (*)    GFR calc Af Amer 41 (*)    All other components within normal limits  CBC WITH DIFFERENTIAL/PLATELET  POC OCCULT BLOOD, ED  I-STAT TROPOININ, ED  I-STAT TROPOININ, ED  TYPE AND SCREEN  ABO/RH    EKG  EKG Interpretation  Date/Time:  Monday September 01 2016 13:49:28 EST Ventricular Rate:  82 PR Interval:    QRS Duration: 83 QT Interval:  476 QTC Calculation: 556 R Axis:   12 Text Interpretation:  Sinus rhythm Abnormal R-wave progression, early transition new Borderline repolarization abnormality Prolonged QT interval Confirmed by Anitra Lauth  MD, Alphonzo Lemmings ([id number]) on 09/01/2016 2:18:54 PM       Radiology No results found.  Procedures Procedures (including critical care time)  Medications Ordered in ED Medications  sodium chloride 0.9 % bolus 500 mL (not administered)  0.9 %  sodium chloride infusion (not administered)     Initial Impression / Assessment and Plan / ED Course  I have reviewed the triage vital signs  and the nursing notes.  Pertinent labs & imaging results that were available during my care of the patient were reviewed by me and considered in my medical decision making (see chart for details).  Clinical Course    Patient is an elderly female presenting today with 2 episodes of black stool that started at 10 AM this morning. When she was seen at urgent care she was noted to be hypotensive and caretaker states she was not acting herself at that time. However now she is back to her baseline. She has no abdominal pain at this time. She does have black stool but also takes iron. No prior history of GI bleed other than hemorrhoids. There is no bright red blood on exam. Blood pressure is slightly low at 93/82 but patient is awake alert and reactive. CBC, CMP, type and screen, fecal occult and EKG pending.  4:15 PM CBC with leukocytosis of 15,000, CMP with new acute kidney injury with a creatinine of 1.39 and a BUN of 46 from baseline of 0.8 which could be prerenal. Troponin within normal limits and EKG with some borderline repolarization abnormalities. Since patient has been here she has been hemodynamically stable with pressures in the low 100s. She did take her blood pressure medications this morning and recommended holding those until blood pressure readings are more back to her normal. Her fecal blood was negative and hemoglobin is within normal limits.  On reevaluation patient has no reproducible abdominal pain. Patient's family member is present in the room and discussed findings with her. Patient is requesting something to eat and drink was able to tolerate by mouth's. She has no exposure to C. difficile or recent antibiotics in the last month. Feel that it's reasonable for patient to go back to her group home and return if symptoms worsen. Final Clinical Impressions(s) / ED Diagnoses   Final diagnoses:  Diarrhea, unspecified type  Dehydration    New Prescriptions New Prescriptions   No  medications on file     Gwyneth Sprout, MD 09/01/16 1619

## 2016-09-01 NOTE — ED Notes (Signed)
Dr. Anitra LauthPlunkett in room at this time

## 2016-12-13 IMAGING — CR DG CHEST 1V PORT
1 series · 1 of 1 positions shown · non-contrast
Comparison: Chest x-ray 04/11/2014.

CLINICAL DATA: Code stroke.  Left-sided weakness and aphasia.

EXAM:
PORTABLE CHEST - 1 VIEW

[AP]
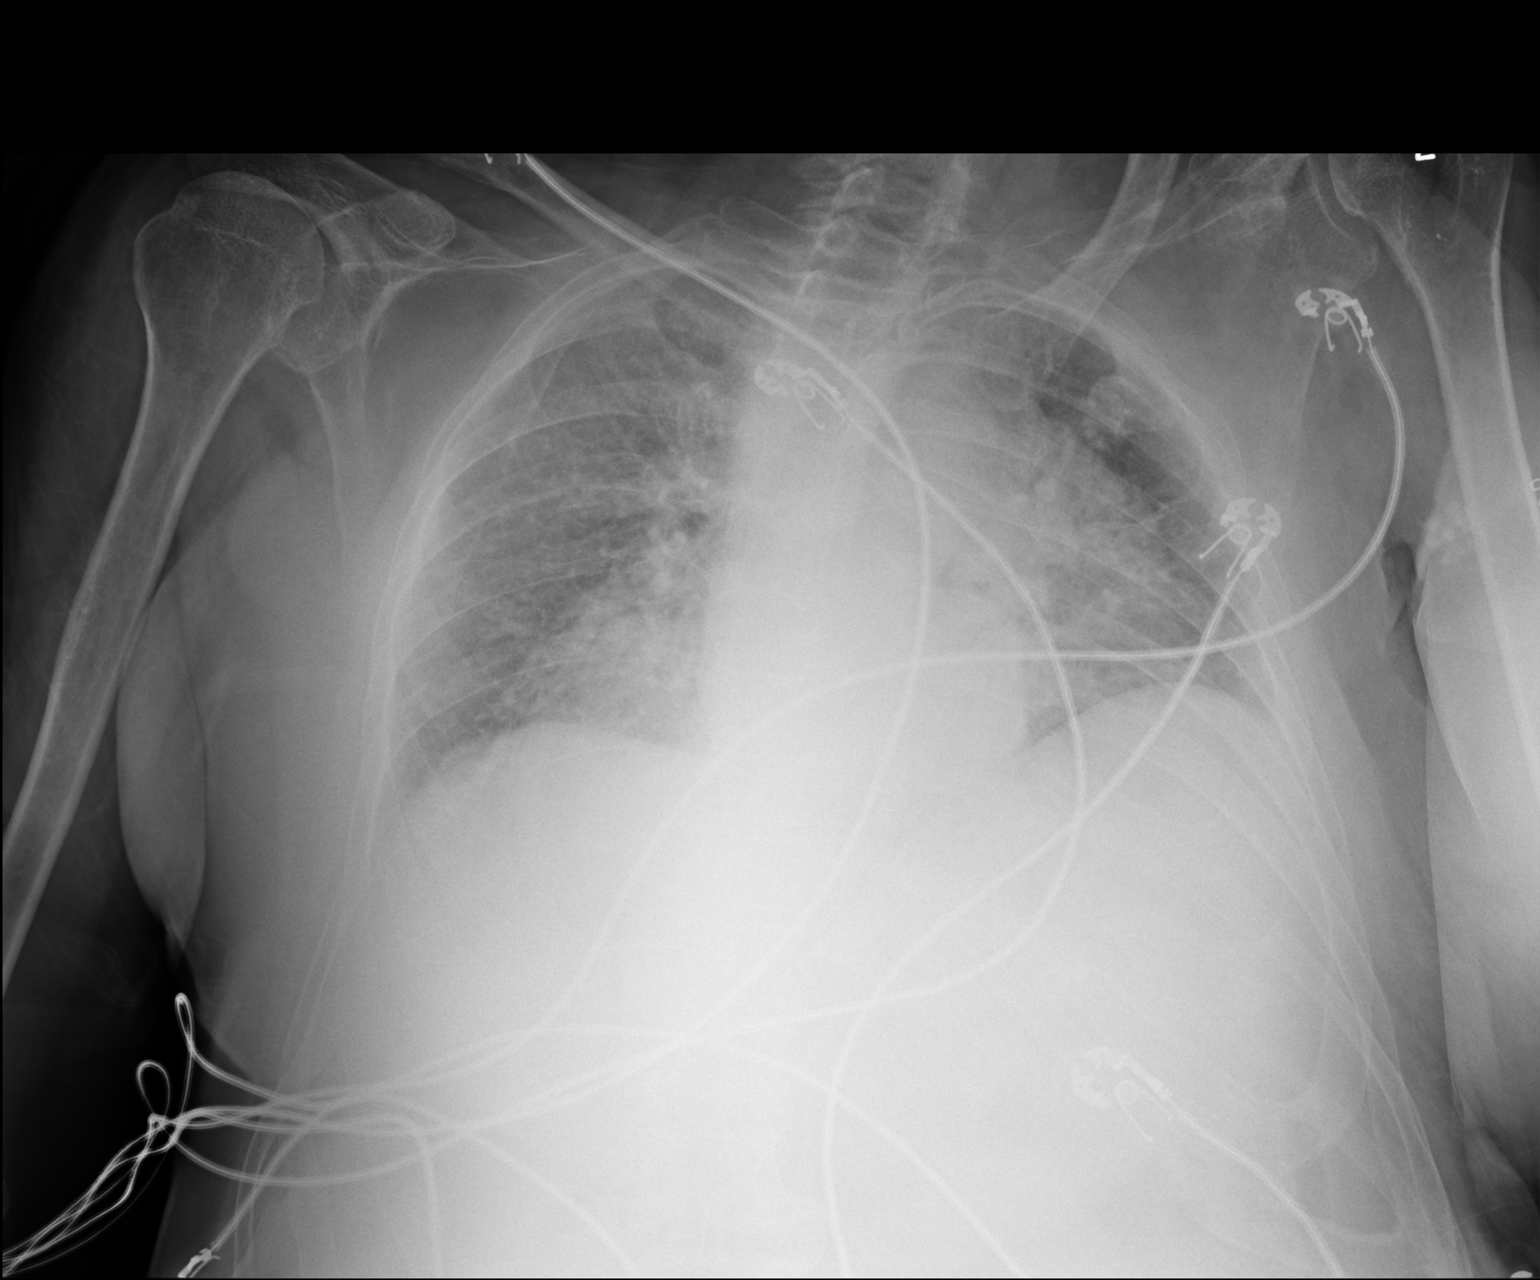

[1 of 1 positions shown; findings below may reference images not displayed]

FINDINGS: The heart is enlarged. A large hiatal hernia is again noted. Diffuse
interstitial and airspace disease is present. Right middle lobe or
lower lobe airspace disease is evident. The lung volumes are low.
IMPRESSION: 1. Cardiomegaly and mild edema.
2. Asymmetric right middle or lower lobe airspace disease concerning
for pneumonia or aspiration.
3. Large hiatal hernia again noted.

## 2016-12-13 IMAGING — MR MR HEAD W/O CM
5 of 8 series · 29 of 48 positions shown · non-contrast
Comparison: CT head earlier today.

CLINICAL DATA: Code stroke. Acute onset of LEFT hemiparesis.
Decreased responsiveness.

EXAM:
MRI HEAD WITHOUT CONTRAST
TECHNIQUE: Multiplanar, multiecho pulse sequences of the brain and surrounding
structures were obtained without intravenous contrast.

[Series 4: DWI · axial · 5.0mm · 1.09mm/px · z∈[-62,+80]mm · 9 of 60 slices shown (1 of 2)]
[im 1/60]
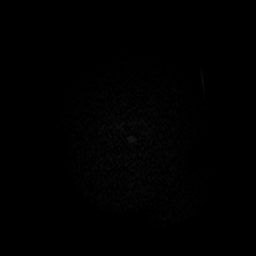
[im 11/60]
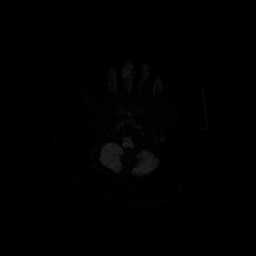
[im 17/60]
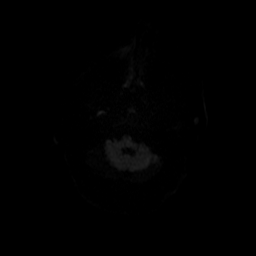
[im 27/60]
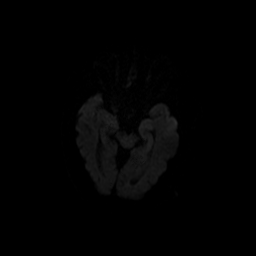
[im 33/60]
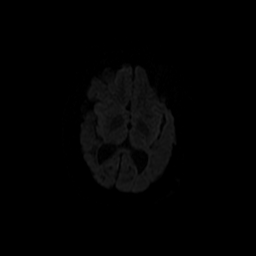
[im 43/60]
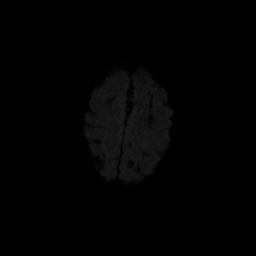
[im 49/60]
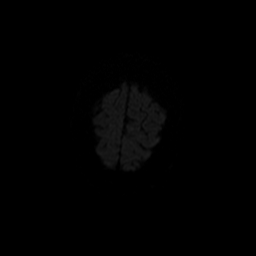
[im 54/60]
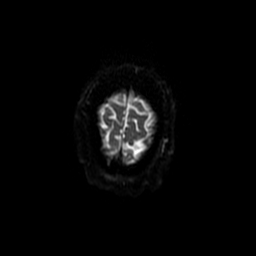
[im 60/60]
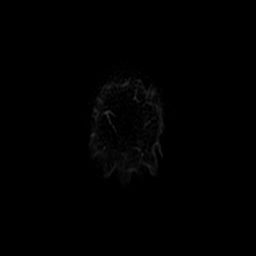

[Series 5: T2 · axial · 5.0mm · 0.43mm/px · z∈[-68,+74]mm · 5 of 25 slices shown (1 of 2)]
[im 1/25]
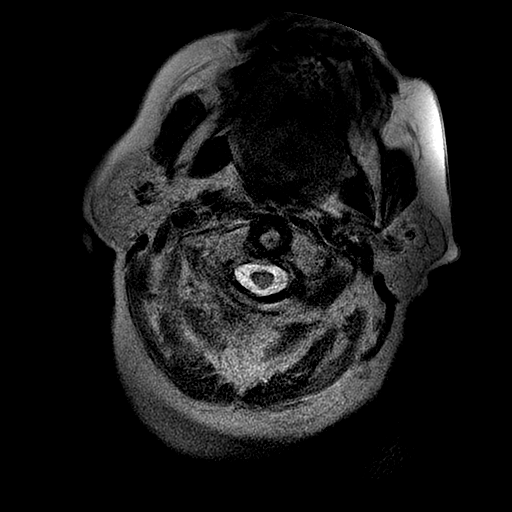
[im 7/25]
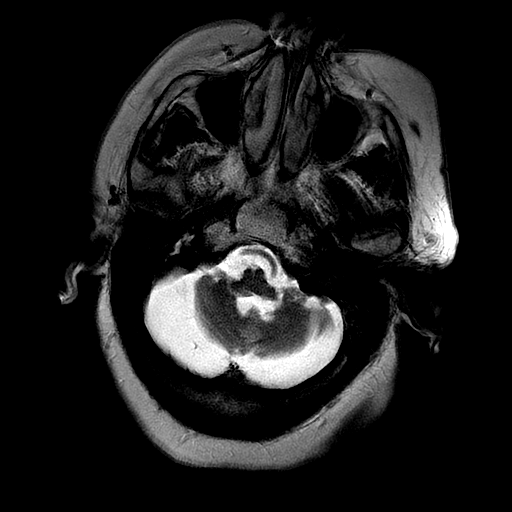
[im 13/25]
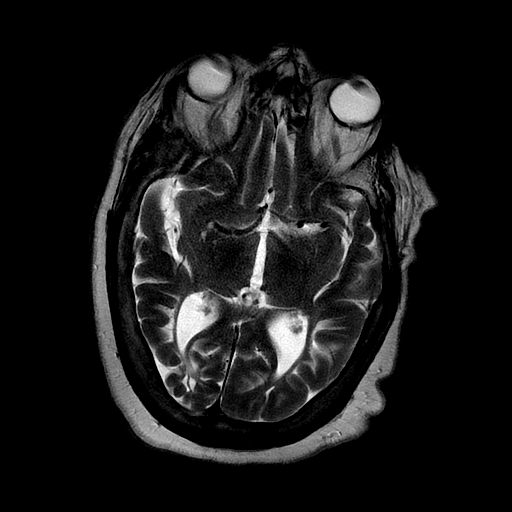
[im 19/25]
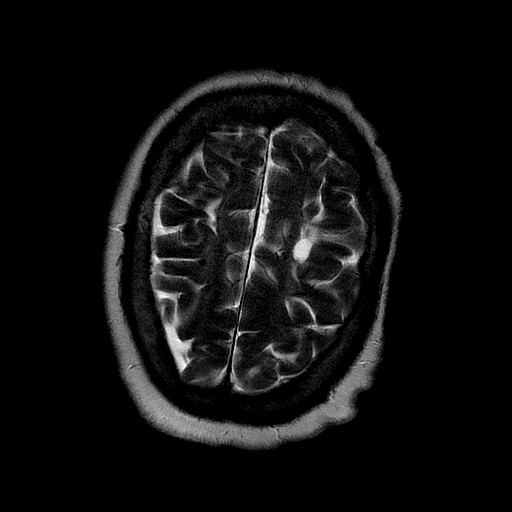
[im 25/25]
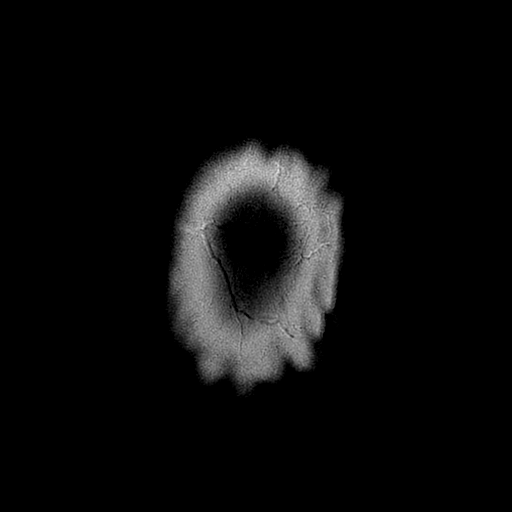

[Series 6: FLAIR · axial · 5.0mm · 0.43mm/px · z∈[-68,+74]mm · 5 of 25 slices shown]
[im 1/25]
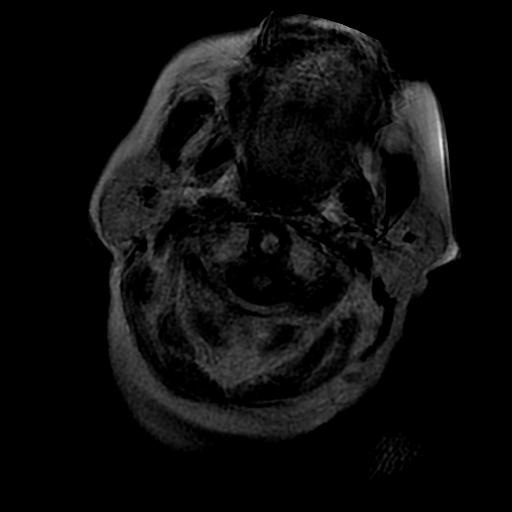
[im 7/25]
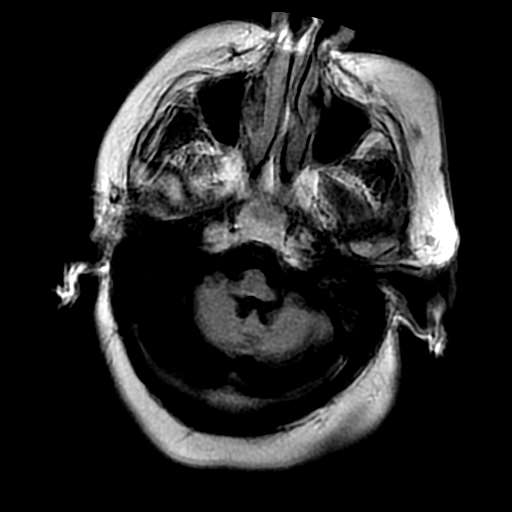
[im 13/25]
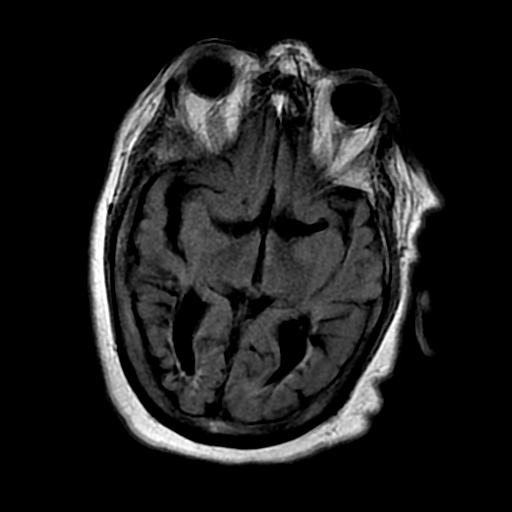
[im 19/25]
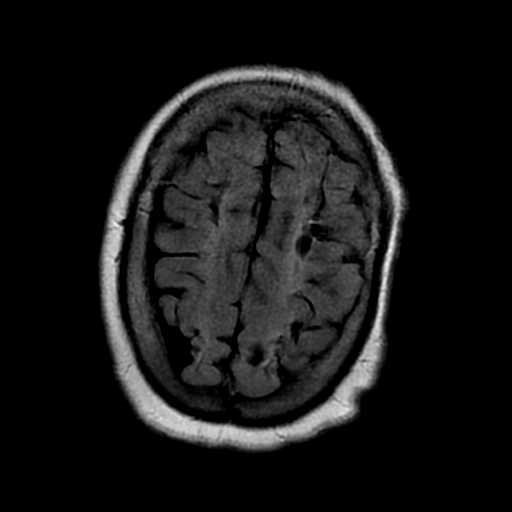
[im 25/25]
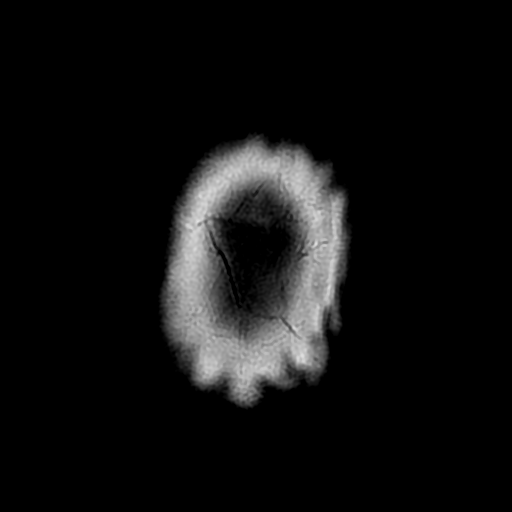

[Series 11: T2 · coronal · 5.0mm · 0.43mm/px · 4 of 25 slices shown (2 of 2)]
[im 1/25]
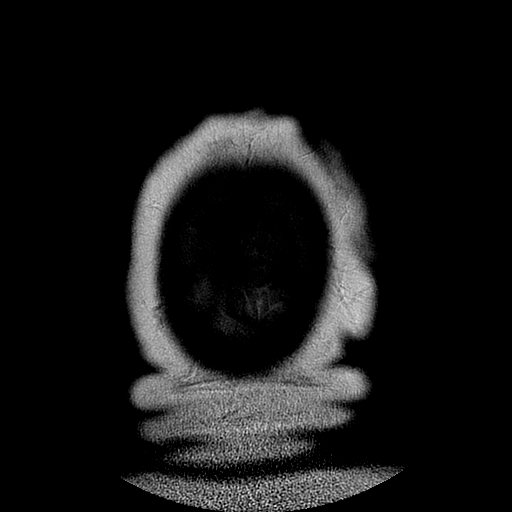
[im 7/25]
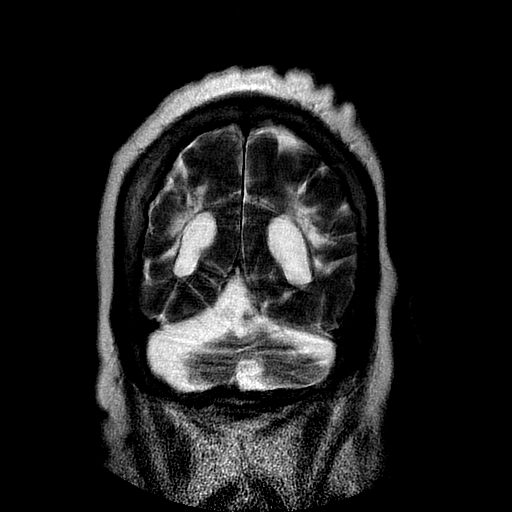
[im 13/25]
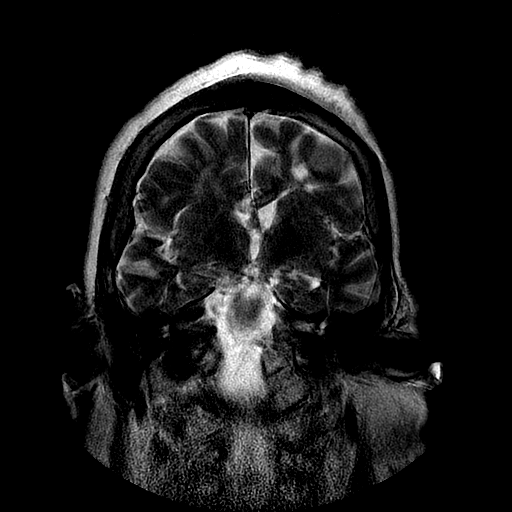
[im 19/25]
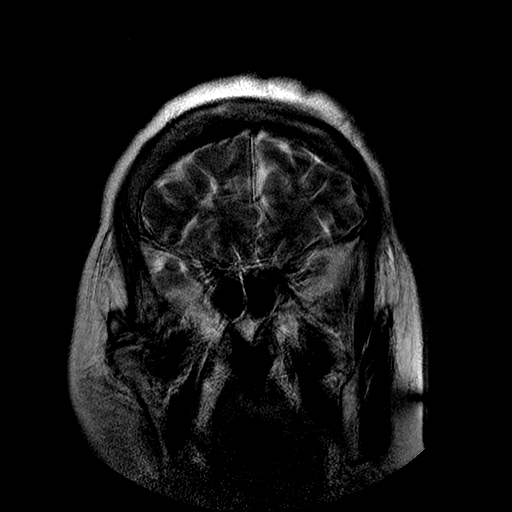

[Series 400: DWI · axial · 5.0mm · 1.09mm/px · z∈[-62,+80]mm · 6 of 30 slices shown (2 of 2)]
[im 1/30]
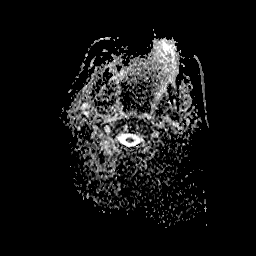
[im 6/30]
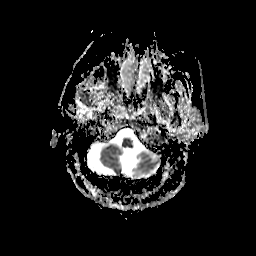
[im 12/30]
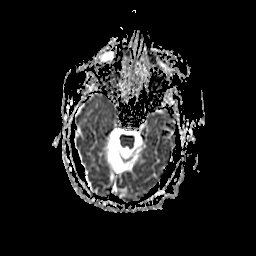
[im 18/30]
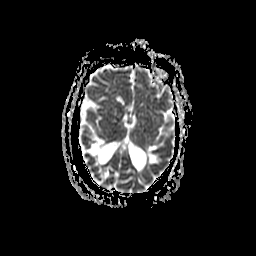
[im 24/30]
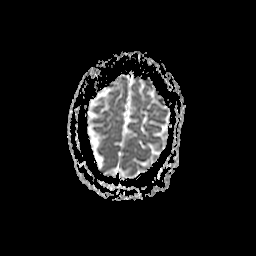
[im 30/30]
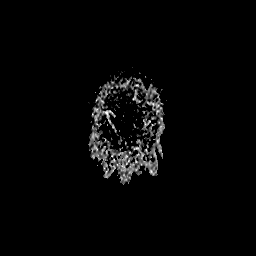

[29 of 48 positions shown; findings below may reference images not displayed]

FINDINGS: The patient was unable to remain motionless for the exam. Small or
subtle lesions could be overlooked.

No evidence for acute stroke, acute hemorrhage, mass lesion,
hydrocephalus, or extra-axial fluid.

There is severe cerebellar atrophy and moderate cerebral atrophy.
There is extensive periventricular white matter signal abnormality
with cystic change, and gliosis, without chronic hemorrhage. These
findings are greater in the RIGHT greater than LEFT
parieto-occipital regions but also affect the white matter
surrounding the frontal horns, with associated cortical volume loss.
The findings are most consistent with severe periventricular
leukomalacia. BILATERAL posterior fossa hygromas surround the
atrophic cerebellum.

No restricted diffusion to suggest acute or recent epileptiform
activity.

Flow voids are maintained in the carotid, basilar, and LEFT
vertebral arteries. RIGHT vertebral is diminutive. Negative orbits.
No sinus or mastoid disease. Thickened calvarium, likely secondary
effect of the lap.
IMPRESSION: Advanced chronic changes of cerebral atrophy, cystic
encephalomalacia, and gliosis most consistent with a perinatal
hypoxic ischemic insult resulting in periventricular leukomalacia.
These changes appear worse on the RIGHT in the supratentorial
region, and some degree of chronic LEFT hemiparesis could be
expected.

Advanced cerebellar atrophy with ex vacuo posterior fossa hygromas.

No restricted diffusion to suggest acute stroke, nor is there
evidence for recent epileptiform activity.

## 2017-03-03 ENCOUNTER — Other Ambulatory Visit: Payer: Self-pay | Admitting: Family Medicine

## 2017-03-03 DIAGNOSIS — Z1231 Encounter for screening mammogram for malignant neoplasm of breast: Secondary | ICD-10-CM

## 2017-04-09 ENCOUNTER — Ambulatory Visit
Admission: RE | Admit: 2017-04-09 | Discharge: 2017-04-09 | Disposition: A | Discharge status: Home / Self Care | Payer: Medicare Other | Source: Ambulatory Visit | Attending: Family Medicine | Admitting: Family Medicine

## 2017-04-09 DIAGNOSIS — Z1231 Encounter for screening mammogram for malignant neoplasm of breast: Secondary | ICD-10-CM

## 2017-08-25 ENCOUNTER — Other Ambulatory Visit: Payer: Self-pay

## 2017-08-25 ENCOUNTER — Emergency Department (HOSPITAL_COMMUNITY): Payer: Medicare Other

## 2017-08-25 DIAGNOSIS — J45909 Unspecified asthma, uncomplicated: Secondary | ICD-10-CM | POA: Insufficient documentation

## 2017-08-25 DIAGNOSIS — Z7982 Long term (current) use of aspirin: Secondary | ICD-10-CM | POA: Insufficient documentation

## 2017-08-25 DIAGNOSIS — E039 Hypothyroidism, unspecified: Secondary | ICD-10-CM | POA: Diagnosis not present

## 2017-08-25 DIAGNOSIS — R0602 Shortness of breath: Secondary | ICD-10-CM | POA: Diagnosis present

## 2017-08-25 DIAGNOSIS — I110 Hypertensive heart disease with heart failure: Secondary | ICD-10-CM | POA: Insufficient documentation

## 2017-08-25 DIAGNOSIS — I5032 Chronic diastolic (congestive) heart failure: Secondary | ICD-10-CM | POA: Diagnosis not present

## 2017-08-25 DIAGNOSIS — Z79899 Other long term (current) drug therapy: Secondary | ICD-10-CM | POA: Insufficient documentation

## 2017-08-25 DIAGNOSIS — E119 Type 2 diabetes mellitus without complications: Secondary | ICD-10-CM | POA: Insufficient documentation

## 2017-08-25 DIAGNOSIS — J209 Acute bronchitis, unspecified: Secondary | ICD-10-CM | POA: Diagnosis not present

## 2017-08-25 DIAGNOSIS — F819 Developmental disorder of scholastic skills, unspecified: Secondary | ICD-10-CM | POA: Insufficient documentation

## 2017-08-25 LAB — CBC
HCT: 36.5 % (ref 36.0–46.0)
Hemoglobin: 11.4 g/dL — ABNORMAL LOW (ref 12.0–15.0)
MCH: 30.3 pg (ref 26.0–34.0)
MCHC: 31.2 g/dL (ref 30.0–36.0)
MCV: 97.1 fL (ref 78.0–100.0)
PLATELETS: 172 10*3/uL (ref 150–400)
RBC: 3.76 MIL/uL — AB (ref 3.87–5.11)
RDW: 13.8 % (ref 11.5–15.5)
WBC: 8.9 10*3/uL (ref 4.0–10.5)

## 2017-08-25 LAB — I-STAT TROPONIN, ED: TROPONIN I, POC: 0.01 ng/mL (ref 0.00–0.08)

## 2017-08-25 LAB — I-STAT CG4 LACTIC ACID, ED: LACTIC ACID, VENOUS: 2.01 mmol/L — AB (ref 0.5–1.9)

## 2017-08-25 MED ORDER — ALBUTEROL SULFATE (2.5 MG/3ML) 0.083% IN NEBU
5.0000 mg | INHALATION_SOLUTION | Freq: Once | RESPIRATORY_TRACT | Status: AC
  Administered 2017-08-25: 5 mg via RESPIRATORY_TRACT
  Filled 2017-08-25: qty 6

## 2017-08-25 NOTE — ED Triage Notes (Signed)
Patient with shortness of breath with audible wheezing.  Patient is from residential home with caretakers.  Patient with low grade fever in triage.

## 2017-08-26 ENCOUNTER — Emergency Department (HOSPITAL_COMMUNITY)
Admission: EM | Admit: 2017-08-26 | Discharge: 2017-08-26 | Disposition: A | Discharge status: Home / Self Care | Payer: Medicare Other | Attending: Emergency Medicine | Admitting: Emergency Medicine

## 2017-08-26 DIAGNOSIS — J209 Acute bronchitis, unspecified: Secondary | ICD-10-CM

## 2017-08-26 LAB — BASIC METABOLIC PANEL
Anion gap: 9 (ref 5–15)
BUN: 27 mg/dL — AB (ref 6–20)
CALCIUM: 8.9 mg/dL (ref 8.9–10.3)
CO2: 26 mmol/L (ref 22–32)
CREATININE: 0.87 mg/dL (ref 0.44–1.00)
Chloride: 100 mmol/L — ABNORMAL LOW (ref 101–111)
Glucose, Bld: 126 mg/dL — ABNORMAL HIGH (ref 65–99)
Potassium: 4.1 mmol/L (ref 3.5–5.1)
SODIUM: 135 mmol/L (ref 135–145)

## 2017-08-26 MED ORDER — LEVOFLOXACIN 500 MG PO TABS
500.0000 mg | ORAL_TABLET | Freq: Once | ORAL | Status: AC
  Administered 2017-08-26: 500 mg via ORAL
  Filled 2017-08-26: qty 1

## 2017-08-26 MED ORDER — GUAIFENESIN 100 MG/5ML PO LIQD: 100.0000 mg | ORAL | 0 refills | Status: DC | PRN

## 2017-08-26 MED ORDER — PREDNISOLONE 15 MG/5ML PO SYRP: 30.0000 mg | ORAL_SOLUTION | Freq: Every day | ORAL | 0 refills | Status: AC

## 2017-08-26 MED ORDER — LEVOFLOXACIN 500 MG PO TABS: 500.0000 mg | ORAL_TABLET | Freq: Every day | ORAL | 0 refills | Status: DC

## 2017-08-26 NOTE — ED Notes (Signed)
Family at bedside. 

## 2017-08-26 NOTE — ED Provider Notes (Signed)
[facility] EMERGENCY DEPARTMENT Provider Note   CSN: [id number] Arrival date & time: 08/25/17  2256     History   Chief Complaint Chief Complaint  Patient presents with  . Shortness of Breath    HPI Leslie Chandler is a 83 y.o. female.  HPI Patient is a 83 year old female with a history of cognitive developmental delay and asthma who presents the emergency department with her caretaker and her family member for productive cough and audible wheezing noted by family.  Reported low-grade fever earlier today.  No mental status changes.  No complaints of chest or abdominal pain.  No dysuria or urinary frequency.  No recent change in her medications.  Patient has tried bronchodilators at home with some improvement but not complete relief   Past Medical History:  Diagnosis Date  . Asthma   . Cognitive developmental delay   . Epilepsia (HCC)   . Hypertension   . Mental retardation   . Osteoporosis     Patient Active Problem List   Diagnosis Date Noted  . Chronic diastolic heart failure (HCC) 08/08/2015  . Aspiration pneumonia (HCC) 08/06/2015  . Sepsis (HCC) 08/05/2015  . Diarrhea 08/05/2015  . Type 2 diabetes mellitus (HCC) 08/05/2015  . Lactic acidosis 08/05/2015  . CAP (community acquired pneumonia) 04/12/2014  . Mental retardation   . Hypertension   . Asthma   . HCAP (healthcare-associated pneumonia) 04/11/2014    Past Surgical History:  Procedure Laterality Date  . BREAST RECONSTRUCTION    . DENTAL SURGERY    . REDUCTION MAMMAPLASTY Bilateral     OB History    No data available       Home Medications    Prior to Admission medications   Medication Sig Start Date End Date Taking? Authorizing Provider  albuterol (PROAIR HFA) 108 (90 Base) MCG/ACT inhaler Inhale 2 puffs into the lungs every 4 (four) hours as needed for wheezing or shortness of breath. Use with spacer    [provider]  albuterol (PROVENTIL) (2.5 MG/3ML) 0.083%  nebulizer solution Take 2.5 mg by nebulization every 4 (four) hours as needed for wheezing or shortness of breath.     [provider]  aspirin 81 MG chewable tablet Chew 81 mg by mouth daily.     [provider]  carvedilol (COREG) 3.125 MG tablet Take 1 tablet (3.125 mg total) by mouth 2 (two) times daily with a meal. 08/08/15   Hollice Espy, MD  Cholecalciferol (VITAMIN D3) 400 UNIT/ML LIQD Take 1,200 Units by mouth daily.    [provider]  clotrimazole-betamethasone (LOTRISONE) lotion Apply 1 application topically 2 (two) times daily.    [provider]  cyclobenzaprine (FLEXERIL) 10 MG tablet Take 10 mg by mouth at bedtime as needed (neck pain).    [provider]  erythromycin ophthalmic ointment Place 1 application into both eyes 2 (two) times daily as needed (crusting).    [provider]  ferrous sulfate 220 (44 FE) MG/5ML solution Take 220 mg by mouth at bedtime.     [provider]  fluticasone (FLOVENT HFA) 110 MCG/ACT inhaler Inhale 2 puffs into the lungs 2 (two) times daily.    [provider]  guaiFENesin (ROBITUSSIN) 100 MG/5ML liquid Take 5-10 mLs (100-200 mg total) by mouth every 4 (four) hours as needed for cough. 08/26/17   Azalia Bilis, MD  levofloxacin (LEVAQUIN) 500 MG tablet Take 1 tablet (500 mg total) by mouth daily. 08/26/17   Azalia Bilis,  MD  levothyroxine (SYNTHROID, LEVOTHROID) 88 MCG tablet Take 88 mcg by mouth daily with lunch.     [provider]  lisinopril-hydrochlorothiazide (PRINZIDE,ZESTORETIC) 10-12.5 MG per tablet Take 1 tablet by mouth daily.    [provider]  montelukast (SINGULAIR) 10 MG tablet Take 10 mg by mouth at bedtime.    [provider]  OVER THE COUNTER MEDICATION Take 60 mg by mouth daily. Viactiv Soft Calcium Chew Chocolate 60 mg    [provider]  Phenylephrine-Cocoa Butter (QC HEMORRHOIDAL RE) Place 1 application rectally 2 (two)  times daily as needed (hemorrhoids). ointment    [provider]  phenytoin (DILANTIN) 125 MG/5ML suspension Take 125 mg by mouth 2 (two) times daily.    [provider]  prednisoLONE (PRELONE) 15 MG/5ML syrup Take 10 mLs (30 mg total) by mouth daily for 5 days. 08/26/17 08/31/17  Azalia Bilis, MD  raloxifene (EVISTA) 60 MG tablet Take 60 mg by mouth daily.    [provider]  triamcinolone cream (KENALOG) 0.1 % Apply 1 application topically See admin instructions. Apply topically twice daily as needed for 1-2 weeks if legs are swollen and red    [provider]    Family History No family history on file.  Social History Social History   Tobacco Use  . Smoking status: Never Smoker  . Smokeless tobacco: Never Used  Substance Use Topics  . Alcohol use: No  . Drug use: No     Allergies   Patient has no known allergies.   Review of Systems Review of Systems  All other systems reviewed and are negative.    Physical Exam Updated Vital Signs BP 115/62   Pulse 99   Resp 16   SpO2 91%   Physical Exam  Constitutional: She appears well-developed and well-nourished. No distress.  HENT:  Head: Normocephalic and atraumatic.  Eyes: EOM are normal.  Neck: Normal range of motion.  Cardiovascular: Normal rate, regular rhythm and normal heart sounds.  Pulmonary/Chest: Effort normal. No stridor. No respiratory distress. She has wheezes. She has no rales.  Abdominal: Soft. She exhibits no distension. There is no tenderness.  Musculoskeletal: Normal range of motion.  Neurological: She is alert.  Skin: Skin is warm and dry.  Psychiatric: She has a normal mood and affect. Judgment normal.  Nursing note and vitals reviewed.    ED Treatments / Results  Labs (all labs ordered are listed, but only abnormal results are displayed) Labs Reviewed  BASIC METABOLIC PANEL - Abnormal; Notable for the following components:      Result Value   Chloride 100  (*)    Glucose, Bld 126 (*)    BUN 27 (*)    All other components within normal limits  CBC - Abnormal; Notable for the following components:   RBC 3.76 (*)    Hemoglobin 11.4 (*)    All other components within normal limits  I-STAT CG4 LACTIC ACID, ED - Abnormal; Notable for the following components:   Lactic Acid, Venous 2.01 (*)    All other components within normal limits  I-STAT TROPONIN, ED    EKG  EKG Interpretation  Date/Time:  Tuesday August 25 2017 23:05:05 EST Ventricular Rate:  95 PR Interval:  120 QRS Duration: 72 QT Interval:  350 QTC Calculation: 439 R Axis:   8 Text Interpretation:  Normal sinus rhythm Nonspecific ST and T wave abnormality Abnormal ECG No significant change was found Confirmed by Azalia Bilis ([id number]) on 08/26/2017  1:31:44 AM Also confirmed by Azalia Bilisampos, Emera Bussie ([id number]), editor Elita QuickWatlington, [place] [phone]  on 08/26/2017 6:52:35 AM       Radiology Dg Chest 2 View  Result Date: 08/26/2017 CLINICAL DATA:  Shortness of breath.  Wheezing. EXAM: CHEST  2 VIEW COMPARISON:  Radiographs and CT 08/05/2015 FINDINGS: Low lung volumes. Cardiomegaly is similar. Large retrocardiac hiatal hernia again seen. Right infrahilar opacities secondary to hiatal hernia and prominent epicardial fat pad. Mild central bronchial thickening. No confluent airspace disease. No pleural effusion or pneumothorax. No acute osseous abnormalities are seen. IMPRESSION: 1. Central bronchial thickening. 2. Unchanged cardiomegaly. 3. Large retrocardiac hiatal hernia. Electronically Signed   By: Rubye OaksMelanie  Ehinger M.D.   On: 08/26/2017 00:25    Procedures Procedures (including critical care time)  Medications Ordered in ED Medications  albuterol (PROVENTIL) (2.5 MG/3ML) 0.083% nebulizer solution 5 mg (5 mg Nebulization Given 08/25/17 2312)  levofloxacin (LEVAQUIN) tablet 500 mg (500 mg Oral Given 08/26/17 0202)     Initial Impression / Assessment and Plan / ED Course  I have reviewed the triage  vital signs and the nursing notes.  Pertinent labs & imaging results that were available during my care of the patient were reviewed by me and considered in my medical decision making (see chart for details).     May represent developing pneumonia.  Patient be covered with Levaquin as well as steroids and bronchodilators.  Family understands to return the emergency department for new or worsening symptoms.  At this time I think the risk of hospitalization outweigh the benefit.  Discharged home in good condition.  Final Clinical Impressions(s) / ED Diagnoses   Final diagnoses:  Acute bronchitis, unspecified organism    ED Discharge Orders        Ordered    levofloxacin (LEVAQUIN) 500 MG tablet  Daily     08/26/17 0239    guaiFENesin (ROBITUSSIN) 100 MG/5ML liquid  Every 4 hours PRN     08/26/17 0239    prednisoLONE (PRELONE) 15 MG/5ML syrup  Daily     08/26/17 0239       Azalia Bilisampos, Arthur Aydelotte, MD 08/26/17 [phone]

## 2018-02-10 ENCOUNTER — Emergency Department (HOSPITAL_COMMUNITY): Payer: Medicare Other

## 2018-02-10 ENCOUNTER — Other Ambulatory Visit: Payer: Self-pay

## 2018-02-10 ENCOUNTER — Encounter (HOSPITAL_COMMUNITY): Payer: Self-pay | Admitting: Emergency Medicine

## 2018-02-10 ENCOUNTER — Emergency Department (HOSPITAL_COMMUNITY)
Admission: EM | Admit: 2018-02-10 | Discharge: 2018-02-11 | Disposition: A | Discharge status: Home / Self Care | Payer: Medicare Other | Attending: Emergency Medicine | Admitting: Emergency Medicine

## 2018-02-10 DIAGNOSIS — I110 Hypertensive heart disease with heart failure: Secondary | ICD-10-CM | POA: Insufficient documentation

## 2018-02-10 DIAGNOSIS — E119 Type 2 diabetes mellitus without complications: Secondary | ICD-10-CM | POA: Diagnosis not present

## 2018-02-10 DIAGNOSIS — Y929 Unspecified place or not applicable: Secondary | ICD-10-CM | POA: Diagnosis not present

## 2018-02-10 DIAGNOSIS — Z7982 Long term (current) use of aspirin: Secondary | ICD-10-CM | POA: Diagnosis not present

## 2018-02-10 DIAGNOSIS — I5032 Chronic diastolic (congestive) heart failure: Secondary | ICD-10-CM | POA: Diagnosis not present

## 2018-02-10 DIAGNOSIS — B354 Tinea corporis: Secondary | ICD-10-CM | POA: Insufficient documentation

## 2018-02-10 DIAGNOSIS — S20211A Contusion of right front wall of thorax, initial encounter: Secondary | ICD-10-CM | POA: Diagnosis not present

## 2018-02-10 DIAGNOSIS — S20301A Unspecified superficial injuries of right front wall of thorax, initial encounter: Secondary | ICD-10-CM | POA: Diagnosis present

## 2018-02-10 DIAGNOSIS — X58XXXA Exposure to other specified factors, initial encounter: Secondary | ICD-10-CM | POA: Insufficient documentation

## 2018-02-10 DIAGNOSIS — F819 Developmental disorder of scholastic skills, unspecified: Secondary | ICD-10-CM | POA: Diagnosis not present

## 2018-02-10 DIAGNOSIS — Y939 Activity, unspecified: Secondary | ICD-10-CM | POA: Insufficient documentation

## 2018-02-10 DIAGNOSIS — B359 Dermatophytosis, unspecified: Secondary | ICD-10-CM

## 2018-02-10 DIAGNOSIS — J45909 Unspecified asthma, uncomplicated: Secondary | ICD-10-CM | POA: Insufficient documentation

## 2018-02-10 DIAGNOSIS — Y999 Unspecified external cause status: Secondary | ICD-10-CM | POA: Insufficient documentation

## 2018-02-10 DIAGNOSIS — Z79899 Other long term (current) drug therapy: Secondary | ICD-10-CM | POA: Insufficient documentation

## 2018-02-10 MED ORDER — CLOTRIMAZOLE 1 % EX CREA: TOPICAL_CREAM | CUTANEOUS | 0 refills | Status: DC

## 2018-02-10 MED ORDER — CLOTRIMAZOLE 1 % EX CREA: TOPICAL_CREAM | Freq: Two times a day (BID) | CUTANEOUS | Status: DC

## 2018-02-10 MED ORDER — CLOTRIMAZOLE 1 % EX CREA
TOPICAL_CREAM | Freq: Once | CUTANEOUS | Status: AC
  Administered 2018-02-10: via TOPICAL
  Filled 2018-02-10: qty 15

## 2018-02-10 NOTE — ED Notes (Addendum)
Patient's family states the day center noticed a "bulge" on her RIGHT side and they thought she should have it checked out. Family also noticed a red ring on her RIGHT lower leg she is concerned about. Patient denies recent fall or pain at this time.

## 2018-02-10 NOTE — ED Triage Notes (Signed)
Patient to ED from [facility] for possible bulge on R side near ribcage. Patient denies any pain and both sides appear to be symmetrical, no obvious bulge upon initial assessment in triage. Patient also has ring-like rash to R lower leg. Patient has been acting per her norm according to facility staff and is no distress.

## 2018-02-10 NOTE — ED Provider Notes (Signed)
[facility] EMERGENCY DEPARTMENT Provider Note   CSN: [id number] Arrival date & time: 02/10/18  1735     History   Chief Complaint Chief Complaint  Patient presents with  . Flank Pain  . Rash    HPI Leslie Chandler is a 83 y.o. female.  The history is provided by the patient and medical records.  Flank Pain   Rash       LEVEL V CAVEAT:  MENTAL RETARDATION 83 y.o. F with hx of asthma, cognitive delay, epilepsy, mental retardation, osteoporosis, HTN, presenting to the ED with questionable "bulge" on right side.  Family reports this was noticed today while she was at her day program.  No reported injury, trauma, or falls.  Family did notice small bruise of right ribs.  No apparent labored breathing, fever, cough, etc.  Has not complained of any pain.  Also small area of rash to right lower leg.  This is circular in appearance.  No new soaps, detergents or other personal care products.   Does not seem pruritic.  No fevers.  Past Medical History:  Diagnosis Date  . Asthma   . Cognitive developmental delay   . Epilepsia (HCC)   . Hypertension   . Mental retardation   . Osteoporosis     Patient Active Problem List   Diagnosis Date Noted  . Chronic diastolic heart failure (HCC) 08/08/2015  . Aspiration pneumonia (HCC) 08/06/2015  . Sepsis (HCC) 08/05/2015  . Diarrhea 08/05/2015  . Type 2 diabetes mellitus (HCC) 08/05/2015  . Lactic acidosis 08/05/2015  . CAP (community acquired pneumonia) 04/12/2014  . Mental retardation   . Hypertension   . Asthma   . HCAP (healthcare-associated pneumonia) 04/11/2014    Past Surgical History:  Procedure Laterality Date  . BREAST RECONSTRUCTION    . DENTAL SURGERY    . REDUCTION MAMMAPLASTY Bilateral      OB History   None      Home Medications    Prior to Admission medications   Medication Sig Start Date End Date Taking? Authorizing Provider  albuterol (PROAIR HFA) 108 (90 Base) MCG/ACT inhaler Inhale 2  puffs into the lungs every 4 (four) hours as needed for wheezing or shortness of breath. Use with spacer    [provider]  albuterol (PROVENTIL) (2.5 MG/3ML) 0.083% nebulizer solution Take 2.5 mg by nebulization every 4 (four) hours as needed for wheezing or shortness of breath.     [provider]  aspirin 81 MG chewable tablet Chew 81 mg by mouth daily.     [provider]  carvedilol (COREG) 3.125 MG tablet Take 1 tablet (3.125 mg total) by mouth 2 (two) times daily with a meal. 08/08/15   Hollice Espy, MD  Cholecalciferol (VITAMIN D3) 400 UNIT/ML LIQD Take 1,200 Units by mouth daily.    [provider]  clotrimazole-betamethasone (LOTRISONE) lotion Apply 1 application topically 2 (two) times daily.    [provider]  cyclobenzaprine (FLEXERIL) 10 MG tablet Take 10 mg by mouth at bedtime as needed (neck pain).    [provider]  erythromycin ophthalmic ointment Place 1 application into both eyes 2 (two) times daily as needed (crusting).    [provider]  ferrous sulfate 220 (44 FE) MG/5ML solution Take 220 mg by mouth at bedtime.     [provider]  fluticasone (FLOVENT HFA) 110 MCG/ACT inhaler Inhale 2 puffs into the lungs 2 (two) times daily.    [provider]  guaiFENesin (ROBITUSSIN) 100 MG/5ML liquid Take 5-10 mLs (100-200 mg total) by mouth every 4 (four) hours as needed for cough. 08/26/17   Azalia Bilisampos, Kevin, MD  levofloxacin (LEVAQUIN) 500 MG tablet Take 1 tablet (500 mg total) by mouth daily. 08/26/17   Azalia Bilisampos, Kevin, MD  levothyroxine (SYNTHROID, LEVOTHROID) 88 MCG tablet Take 88 mcg by mouth daily with lunch.     [provider]  lisinopril-hydrochlorothiazide (PRINZIDE,ZESTORETIC) 10-12.5 MG per tablet Take 1 tablet by mouth daily.    [provider]  montelukast (SINGULAIR) 10 MG tablet Take 10 mg by mouth at bedtime.    [provider]  OVER THE COUNTER MEDICATION Take 60  mg by mouth daily. Viactiv Soft Calcium Chew Chocolate 60 mg    [provider]  Phenylephrine-Cocoa Butter (QC HEMORRHOIDAL RE) Place 1 application rectally 2 (two) times daily as needed (hemorrhoids). ointment    [provider]  phenytoin (DILANTIN) 125 MG/5ML suspension Take 125 mg by mouth 2 (two) times daily.    [provider]  raloxifene (EVISTA) 60 MG tablet Take 60 mg by mouth daily.    [provider]  triamcinolone cream (KENALOG) 0.1 % Apply 1 application topically See admin instructions. Apply topically twice daily as needed for 1-2 weeks if legs are swollen and red    [provider]    Family History No family history on file.  Social History Social History   Tobacco Use  . Smoking status: Never Smoker  . Smokeless tobacco: Never Used  Substance Use Topics  . Alcohol use: No  . Drug use: No     Allergies   Patient has no known allergies.   Review of Systems Review of Systems  Unable to perform ROS: Other     Physical Exam Updated Vital Signs BP 138/71 (BP Location: Left Arm)   Pulse 79   Temp 98 F (36.7 C) (Oral)   Resp 20   SpO2 94%   Physical Exam  Constitutional: She is oriented to person, place, and time. She appears well-developed and well-nourished.  HENT:  Head: Normocephalic and atraumatic.  Mouth/Throat: Oropharynx is clear and moist.  Eyes: Pupils are equal, round, and reactive to light. Conjunctivae and EOM are normal.  Neck: Normal range of motion.  Cardiovascular: Normal rate, regular rhythm and normal heart sounds.  Pulmonary/Chest: Effort normal and breath sounds normal.  Small area of bruise of right lower anterior ribs, non-tender, no apparent difficulty breathing, lungs clear  Abdominal: Soft. Bowel sounds are normal.  Musculoskeletal: Normal range of motion.  Neurological: She is alert and oriented to person, place, and time.  Skin: Skin is warm and dry.  Small area of circular rash  with central clearing to right lower leg that is consistent with ringworm; no signs of superimposed infection or cellulitis  Psychiatric: She has a normal mood and affect.  Nursing note and vitals reviewed.    ED Treatments / Results  Labs (all labs ordered are listed, but only abnormal results are displayed) Labs Reviewed - No data to display  EKG None  Radiology Dg Abd Acute W/chest  Result Date: 02/10/2018 CLINICAL DATA:  Possible bulge involving the right sided rib cage. EXAM: DG ABDOMEN ACUTE W/ 1V CHEST COMPARISON:  Chest radiograph - 08/25/2017; CT of the chest, abdomen and pelvis - 08/05/2015 FINDINGS: Grossly unchanged enlarged cardiac silhouette and mediastinal contours. There is rightward deviation the tracheal air, the level of the thoracic aorta. Large air and fluid containing retrocardiac opacity  compatible with known large hiatal hernia. Grossly unchanged right basilar heterogeneous opacities favored to represent atelectasis. No new focal airspace opacities. No pleural effusion or pneumothorax. No evidence of edema. Moderate colonic stool burden without evidence of enteric obstruction. No pneumoperitoneum, pneumatosis or portal venous gas. No definitive abnormal intra-abdominal calcifications. No definite acute osseus abnormalities. IMPRESSION: 1. Chronic right basilar atelectasis/scar without superimposed acute cardiopulmonary disease. 2. Large hiatal hernia. 3. Moderate colonic stool burden without evidence of enteric obstruction. Electronically Signed   By: Simonne Come M.D.   On: 02/10/2018 19:59    Procedures Procedures (including critical care time)  Medications Ordered in ED Medications  clotrimazole (LOTRIMIN) 1 % cream ( Topical Given 02/10/18 2342)     Initial Impression / Assessment and Plan / ED Course  I have reviewed the triage vital signs and the nursing notes.  Pertinent labs & imaging results that were available during my care of the patient were reviewed  by me and considered in my medical decision making (see chart for details).  83 year old female here with questionable "bulge" on her right side.  This was noticed at her daycare facility today.  She is afebrile and nontoxic.  She does have a small amount of bruising to her right anterior lower ribs, this is nontender.  There is no deformity, lungs are clear without any wheezes or rhonchi.  Chest x-ray without any acute findings, suspect she  may have some contusion.  She does have some stool burden as well, family reports this is somewhat chronic.  Understand use of stool softeners to help.  Patient also with rash of right lower leg.  This is most consistent with ringworm.  There are no signs of superimposed infection or cellulitis.  Will start Lotrimin cream.  Feel patient is stable for discharge.  Family is comfortable with plan of care.  They will follow-up closely with PCP if any ongoing issues.  Discussed plan with family, they acknowledged understanding and agreed with plan of care.  Return precautions given for new or worsening symptoms.  Final Clinical Impressions(s) / ED Diagnoses   Final diagnoses:  Ringworm  Contusion of right chest wall, initial encounter    ED Discharge Orders        Ordered    clotrimazole (LOTRIMIN) 1 % cream     02/10/18 2352       Garlon Hatchet, PA-C 02/11/18 0400    Rolland Porter, MD 02/11/18 2230

## 2018-02-10 NOTE — Discharge Instructions (Signed)
Take the prescribed medication as directed. °Follow-up with your primary care doctor. °Return to the ED for new or worsening symptoms. °

## 2018-02-10 NOTE — ED Provider Notes (Signed)
Patient placed in [facility] pathway, seen and evaluated   Chief Complaint:? Bulge on Left abd and chest.  HPI:   Aid workers noticed  boluging onf Right abd/ chest and Rignworm on the left, patient denies pain  ROS: Rash, bulginh (one)  Physical Exam:   Gen: No distress  Neuro: Awake and Alert  Skin: Warm    Focused Exam: Ring worm R leg. No hernia   Initiation of care has begun. The patient has been counseled on the process, plan, and necessity for staying for the completion/evaluation, and the remainder of the medical screening examination    Arthor CaptainHarris, Ashly Goethe, Cordelia Poche-C 02/10/18 1837    Mesner, Barbara CowerJason, MD 02/11/18 [phone]

## 2018-03-22 ENCOUNTER — Other Ambulatory Visit: Payer: Self-pay | Admitting: Family Medicine

## 2018-03-22 DIAGNOSIS — Z1231 Encounter for screening mammogram for malignant neoplasm of breast: Secondary | ICD-10-CM

## 2018-04-16 ENCOUNTER — Ambulatory Visit: Payer: Medicare Other

## 2018-05-14 ENCOUNTER — Ambulatory Visit
Admission: RE | Admit: 2018-05-14 | Discharge: 2018-05-14 | Disposition: A | Discharge status: Home / Self Care | Payer: Medicare Other | Source: Ambulatory Visit | Attending: Family Medicine | Admitting: Family Medicine

## 2018-05-14 DIAGNOSIS — Z1231 Encounter for screening mammogram for malignant neoplasm of breast: Secondary | ICD-10-CM

## 2018-11-04 ENCOUNTER — Other Ambulatory Visit: Payer: Self-pay

## 2018-11-04 ENCOUNTER — Emergency Department (HOSPITAL_COMMUNITY): Payer: Medicare Other

## 2018-11-04 ENCOUNTER — Emergency Department (HOSPITAL_COMMUNITY)
Admission: EM | Admit: 2018-11-04 | Discharge: 2018-11-04 | Disposition: A | Discharge status: Home / Self Care | Payer: Medicare Other | Attending: Emergency Medicine | Admitting: Emergency Medicine

## 2018-11-04 ENCOUNTER — Encounter (HOSPITAL_COMMUNITY): Payer: Self-pay | Admitting: Emergency Medicine

## 2018-11-04 DIAGNOSIS — R55 Syncope and collapse: Secondary | ICD-10-CM | POA: Insufficient documentation

## 2018-11-04 DIAGNOSIS — Z79899 Other long term (current) drug therapy: Secondary | ICD-10-CM | POA: Insufficient documentation

## 2018-11-04 DIAGNOSIS — J45909 Unspecified asthma, uncomplicated: Secondary | ICD-10-CM | POA: Insufficient documentation

## 2018-11-04 DIAGNOSIS — R4182 Altered mental status, unspecified: Secondary | ICD-10-CM | POA: Diagnosis present

## 2018-11-04 DIAGNOSIS — I951 Orthostatic hypotension: Secondary | ICD-10-CM

## 2018-11-04 LAB — COMPREHENSIVE METABOLIC PANEL
ALBUMIN: 3.2 g/dL — AB (ref 3.5–5.0)
ALT: 16 U/L (ref 0–44)
AST: 19 U/L (ref 15–41)
Alkaline Phosphatase: 132 U/L — ABNORMAL HIGH (ref 38–126)
Anion gap: 10 (ref 5–15)
BILIRUBIN TOTAL: 0.7 mg/dL (ref 0.3–1.2)
BUN: 15 mg/dL (ref 8–23)
CO2: 25 mmol/L (ref 22–32)
CREATININE: 0.99 mg/dL (ref 0.44–1.00)
Calcium: 9 mg/dL (ref 8.9–10.3)
Chloride: 104 mmol/L (ref 98–111)
GFR calc Af Amer: >60 mL/min (ref >60)
GFR calc non Af Amer: 53 mL/min — ABNORMAL LOW (ref >60)
GLUCOSE: 179 mg/dL — AB (ref 70–99)
POTASSIUM: 4.1 mmol/L (ref 3.5–5.1)
Sodium: 139 mmol/L (ref 135–145)
Total Protein: 6.6 g/dL (ref 6.5–8.1)

## 2018-11-04 LAB — CBG MONITORING, ED: GLUCOSE-CAPILLARY: 163 mg/dL — AB (ref 70–99)

## 2018-11-04 LAB — URINALYSIS, ROUTINE W REFLEX MICROSCOPIC
BILIRUBIN URINE: NEGATIVE
GLUCOSE, UA: NEGATIVE mg/dL
HGB URINE DIPSTICK: NEGATIVE
Ketones, ur: NEGATIVE mg/dL
Leukocytes,Ua: NEGATIVE
Nitrite: NEGATIVE
Protein, ur: NEGATIVE mg/dL
Specific Gravity, Urine: 1.013 (ref 1.005–1.030)
pH: 6 (ref 5.0–8.0)

## 2018-11-04 LAB — CBC
HCT: 40.6 % (ref 36.0–46.0)
Hemoglobin: 13 g/dL (ref 12.0–15.0)
MCH: 31 pg (ref 26.0–34.0)
MCHC: 32 g/dL (ref 30.0–36.0)
MCV: 96.7 fL (ref 80.0–100.0)
NRBC: 0 % (ref 0.0–0.2)
Platelets: 156 10*3/uL (ref 150–400)
RBC: 4.2 MIL/uL (ref 3.87–5.11)
RDW: 12.7 % (ref 11.5–15.5)
WBC: 6.7 10*3/uL (ref 4.0–10.5)

## 2018-11-04 LAB — TROPONIN I: Troponin I: <0.03 ng/mL (ref <0.03)

## 2018-11-04 LAB — LACTIC ACID, PLASMA
LACTIC ACID, VENOUS: 2.1 mmol/L — AB (ref 0.5–1.9)
Lactic Acid, Venous: 3 mmol/L (ref 0.5–1.9)

## 2018-11-04 MED ORDER — SODIUM CHLORIDE 0.9 % IV BOLUS
1000.0000 mL | Freq: Once | INTRAVENOUS | Status: AC
  Administered 2018-11-04: 1000 mL via INTRAVENOUS

## 2018-11-04 NOTE — ED Notes (Signed)
Pt to be transported back to group home via [facility]tar.

## 2018-11-04 NOTE — ED Notes (Addendum)
Pt.had stooled dark brown clean pt.began to wipe her up saw bright red blood when wiping her rectum area..finish cleaning her .placed barrier cream on buttucks.place a diaper on pt.with chucks under .clean sheets .report to nurse and DR,CAMPOS

## 2018-11-04 NOTE — ED Notes (Signed)
Pt caregiver opted to take pt back to facility using POV due to long wait for [facility]; Discharge papers given to caregiver at [facility]

## 2018-11-04 NOTE — ED Triage Notes (Addendum)
Pt arrives via [facility] from a "adult group apartment" per report pt was sitting in her wheelchair playing cards when she slumped over and begin to not respond to staff there. Per ems on their arrival she was awake but not answering any questions. On arrival to ED pt is alert and oriented to person only. Pt has raspy speech and is not clear. Per ems pt was diagnosed with flu last week. Pt was hypotensive on ems arrival bp in 70's.

## 2018-11-04 NOTE — ED Provider Notes (Signed)
[facility] EMERGENCY DEPARTMENT Provider Note   CSN: [id number] Arrival date & time: 11/04/18  1191    History   Chief Complaint Chief Complaint  Patient presents with  . Altered Mental Status    HPI Leslie Chandler is a 84 y.o. female.     HPI 84 year old female who was diagnosed with flu last week and was in her normal state of health this morning when she suddenly slumped over for staff.  This occurred at her group home.  She did not respond to staff initially.  On EMS arrival the patient was awake but not answering any questions.  She moves all 4 extremities.  Blood sugar was normal.  On EMS arrival the patient's blood pressure was in the 70s to 80s.     Past Medical History:  Diagnosis Date  . Asthma   . Cognitive developmental delay   . Epilepsia (HCC)   . Hypertension   . Mental retardation   . Osteoporosis     Patient Active Problem List   Diagnosis Date Noted  . Chronic diastolic heart failure (HCC) 08/08/2015  . Aspiration pneumonia (HCC) 08/06/2015  . Sepsis (HCC) 08/05/2015  . Diarrhea 08/05/2015  . Type 2 diabetes mellitus (HCC) 08/05/2015  . Lactic acidosis 08/05/2015  . CAP (community acquired pneumonia) 04/12/2014  . Mental retardation   . Hypertension   . Asthma   . HCAP (healthcare-associated pneumonia) 04/11/2014    Past Surgical History:  Procedure Laterality Date  . BREAST RECONSTRUCTION    . DENTAL SURGERY    . REDUCTION MAMMAPLASTY Bilateral      OB History   No obstetric history on file.      Home Medications    Prior to Admission medications   Medication Sig Start Date End Date Taking? Authorizing Provider  albuterol (PROAIR HFA) 108 (90 Base) MCG/ACT inhaler Inhale 2 puffs into the lungs every 4 (four) hours as needed for wheezing or shortness of breath. Use with spacer   Yes [provider]  albuterol (PROVENTIL) (2.5 MG/3ML) 0.083% nebulizer solution Take 2.5 mg by nebulization every 4 (four) hours  as needed for wheezing or shortness of breath.    Yes [provider]  aspirin 81 MG chewable tablet Chew 81 mg by mouth daily.    Yes [provider]  Calcium-Vitamin D-Vitamin K (VIACTIV PO) Take 1 tablet by mouth every morning.   Yes [provider]  carvedilol (COREG) 3.125 MG tablet Take 1 tablet (3.125 mg total) by mouth 2 (two) times daily with a meal. 08/08/15  Yes Hollice Espy, MD  Cholecalciferol (VITAMIN D3) 400 UNIT/ML LIQD Take 1,200 Units by mouth daily.   Yes [provider]  dextromethorphan 15 MG/5ML syrup Take 5 mLs by mouth every 12 (twelve) hours.   Yes [provider]  ferrous sulfate 220 (44 FE) MG/5ML solution Take 176 mg by mouth at bedtime.    Yes [provider]  fluticasone (FLOVENT HFA) 110 MCG/ACT inhaler Inhale 2 puffs into the lungs 2 (two) times daily.   Yes [provider]  levothyroxine (SYNTHROID, LEVOTHROID) 88 MCG tablet Take 88 mcg by mouth daily at 12 noon.    Yes [provider]  lisinopril-hydrochlorothiazide (PRINZIDE,ZESTORETIC) 10-12.5 MG per tablet Take 1 tablet by mouth daily.   Yes [provider]  montelukast (SINGULAIR) 10 MG tablet Take 10 mg by mouth every morning.    Yes [provider]  Phenylephrine-Cocoa Butter (QC HEMORRHOIDAL RE) Place 1  application rectally 2 (two) times daily as needed (hemorrhoids). ointment   Yes [provider]  phenytoin (DILANTIN) 125 MG/5ML suspension Take 125 mg by mouth 2 (two) times daily.   Yes [provider]  raloxifene (EVISTA) 60 MG tablet Take 60 mg by mouth daily.   Yes [provider]  guaiFENesin (ROBITUSSIN) 100 MG/5ML liquid Take 5-10 mLs (100-200 mg total) by mouth every 4 (four) hours as needed for cough. Patient not taking: Reported on 11/04/2018 08/26/17   Azalia Bilis, MD    Family History No family history on file.  Social History Social History   Tobacco Use  . Smoking  status: Never Smoker  . Smokeless tobacco: Never Used  Substance Use Topics  . Alcohol use: No  . Drug use: No     Allergies   Patient has no known allergies.   Review of Systems Review of Systems  All other systems reviewed and are negative.    Physical Exam Updated Vital Signs BP 117/64   Pulse 84   Temp (!) 97.1 F (36.2 C) (Rectal)   Resp 16   SpO2 96%   Physical Exam Vitals signs and nursing note reviewed.  Constitutional:      General: She is not in acute distress.    Appearance: She is well-developed.  HENT:     Head: Normocephalic and atraumatic.  Neck:     Musculoskeletal: Normal range of motion.  Cardiovascular:     Rate and Rhythm: Normal rate and regular rhythm.     Heart sounds: Normal heart sounds.  Pulmonary:     Effort: Pulmonary effort is normal.     Breath sounds: Normal breath sounds.  Abdominal:     General: There is no distension.     Palpations: Abdomen is soft.     Tenderness: There is no abdominal tenderness.  Musculoskeletal: Normal range of motion.  Skin:    General: Skin is warm and dry.  Neurological:     Mental Status: She is alert and oriented to person, place, and time.  Psychiatric:        Judgment: Judgment normal.      ED Treatments / Results  Labs (all labs ordered are listed, but only abnormal results are displayed) Labs Reviewed  COMPREHENSIVE METABOLIC PANEL - Abnormal; Notable for the following components:      Result Value   Glucose, Bld 179 (*)    Albumin 3.2 (*)    Alkaline Phosphatase 132 (*)    GFR calc non Af Amer 53 (*)    All other components within normal limits  LACTIC ACID, PLASMA - Abnormal; Notable for the following components:   Lactic Acid, Venous 3.0 (*)    All other components within normal limits  LACTIC ACID, PLASMA - Abnormal; Notable for the following components:   Lactic Acid, Venous 2.1 (*)    All other components within normal limits  CBG MONITORING, ED - Abnormal; Notable for the  following components:   Glucose-Capillary 163 (*)    All other components within normal limits  CULTURE, BLOOD (ROUTINE X 2)  CULTURE, BLOOD (ROUTINE X 2)  URINE CULTURE  CBC  TROPONIN I  URINALYSIS, ROUTINE W REFLEX MICROSCOPIC    EKG None  Radiology Ct Head Wo Contrast  Result Date: 11/04/2018 CLINICAL DATA:  Altered level of consciousness. EXAM: CT HEAD WITHOUT CONTRAST TECHNIQUE: Contiguous axial images were obtained from the base of the skull through the vertex without intravenous contrast. COMPARISON:  Head CT  and MRI 08/05/2015 FINDINGS: Brain: There is no evidence of acute infarct, intracranial hemorrhage, mass, midline shift, or extra-axial fluid collection. Multifocal encephalomalacia is again seen in both cerebral hemispheres with particular involvement of the white matter and which may be related to a perinatal insult, unchanged. There is dysgenesis of the corpus callosum, and there is unchanged severe cerebellar atrophy with associated brainstem volume loss as well. Vascular: Calcified atherosclerosis at the skull base. No hyperdense of vessel. Skull: No fracture or focal osseous lesion. Sinuses/Orbits: Mild mucosal thickening in the paranasal sinuses. Small mastoid effusions. Unremarkable orbits. Other: None. IMPRESSION: 1. No evidence of acute intracranial abnormality. 2. Extensive chronic changes as above. Electronically Signed   By: Sebastian Ache M.D.   On: 11/04/2018 13:54   Dg Chest Portable 1 View  Result Date: 11/04/2018 CLINICAL DATA:  Flu symptoms 1 week.  Confused. EXAM: PORTABLE CHEST 1 VIEW COMPARISON:  02/10/2018 and 08/25/2017 FINDINGS: Lungs are hypoinflated with mild stable prominence of the central pulmonary vessels. No lobar consolidation or effusion. Mild stable cardiomegaly. Evidence of patient's moderate size hiatal hernia. Cardiomediastinal silhouette and remainder of the exam is unchanged. IMPRESSION: Mild stable cardiomegaly with suggestion of minimal chronic  vascular congestion. Moderate size hiatal hernia. Electronically Signed   By: Elberta Fortis M.D.   On: 11/04/2018 12:40    Procedures Procedures (including critical care time)  Medications Ordered in ED Medications  sodium chloride 0.9 % bolus 1,000 mL (0 mLs Intravenous Stopped 11/04/18 1206)     Initial Impression / Assessment and Plan / ED Course  I have reviewed the triage vital signs and the nursing notes.  Pertinent labs & imaging results that were available during my care of the patient were reviewed by me and considered in my medical decision making (see chart for details).       Patient was observed in the emergency department for 5-1/2 hours.  She has had 1-2 loose stools.  No melena.  Vital signs quickly improved with IV fluids.  She feels fine.  Staff members from the group home are with her and states she has baseline mental status at this time.  Work-up otherwise without significant abnormality.  She is cared for closely at the group home.  Overall I think she will do better at home than she will in the hospital.  They are going to observe her closely at the facility and return to the emergency department for any new or worsening symptoms.  Likely orthostatic hypotension.  Resolved with IV fluids.    Final Clinical Impressions(s) / ED Diagnoses   Final diagnoses:  None    ED Discharge Orders    None       Azalia Bilis, MD 11/04/18 1529

## 2018-11-05 LAB — URINE CULTURE: Culture: NO GROWTH

## 2018-11-09 LAB — CULTURE, BLOOD (ROUTINE X 2)
Culture: NO GROWTH
Culture: NO GROWTH

## 2019-06-27 ENCOUNTER — Other Ambulatory Visit: Payer: Self-pay | Admitting: Family Medicine

## 2019-06-27 DIAGNOSIS — Z1231 Encounter for screening mammogram for malignant neoplasm of breast: Secondary | ICD-10-CM

## 2019-07-15 ENCOUNTER — Emergency Department (HOSPITAL_COMMUNITY): Payer: Medicare Other

## 2019-07-15 ENCOUNTER — Inpatient Hospital Stay (HOSPITAL_COMMUNITY)
Admission: EM | Admit: 2019-07-15 | Discharge: 2019-07-20 | DRG: 177 | Disposition: A | Discharge status: Home / Self Care | Payer: Medicare Other | Attending: Internal Medicine | Admitting: Internal Medicine

## 2019-07-15 ENCOUNTER — Encounter (HOSPITAL_COMMUNITY): Payer: Self-pay | Admitting: Emergency Medicine

## 2019-07-15 ENCOUNTER — Other Ambulatory Visit: Payer: Self-pay

## 2019-07-15 DIAGNOSIS — Z7982 Long term (current) use of aspirin: Secondary | ICD-10-CM | POA: Diagnosis not present

## 2019-07-15 DIAGNOSIS — F79 Unspecified intellectual disabilities: Secondary | ICD-10-CM | POA: Diagnosis present

## 2019-07-15 DIAGNOSIS — I10 Essential (primary) hypertension: Secondary | ICD-10-CM | POA: Diagnosis not present

## 2019-07-15 DIAGNOSIS — E785 Hyperlipidemia, unspecified: Secondary | ICD-10-CM | POA: Diagnosis present

## 2019-07-15 DIAGNOSIS — Z993 Dependence on wheelchair: Secondary | ICD-10-CM

## 2019-07-15 DIAGNOSIS — Z79890 Hormone replacement therapy (postmenopausal): Secondary | ICD-10-CM | POA: Diagnosis not present

## 2019-07-15 DIAGNOSIS — R269 Unspecified abnormalities of gait and mobility: Secondary | ICD-10-CM | POA: Diagnosis present

## 2019-07-15 DIAGNOSIS — Z6839 Body mass index (BMI) 39.0-39.9, adult: Secondary | ICD-10-CM

## 2019-07-15 DIAGNOSIS — M810 Age-related osteoporosis without current pathological fracture: Secondary | ICD-10-CM | POA: Diagnosis present

## 2019-07-15 DIAGNOSIS — R1312 Dysphagia, oropharyngeal phase: Secondary | ICD-10-CM | POA: Diagnosis present

## 2019-07-15 DIAGNOSIS — I5032 Chronic diastolic (congestive) heart failure: Secondary | ICD-10-CM | POA: Diagnosis present

## 2019-07-15 DIAGNOSIS — Z66 Do not resuscitate: Secondary | ICD-10-CM | POA: Diagnosis present

## 2019-07-15 DIAGNOSIS — B9689 Other specified bacterial agents as the cause of diseases classified elsewhere: Secondary | ICD-10-CM | POA: Diagnosis present

## 2019-07-15 DIAGNOSIS — R7881 Bacteremia: Secondary | ICD-10-CM | POA: Diagnosis present

## 2019-07-15 DIAGNOSIS — E669 Obesity, unspecified: Secondary | ICD-10-CM | POA: Diagnosis present

## 2019-07-15 DIAGNOSIS — Z7951 Long term (current) use of inhaled steroids: Secondary | ICD-10-CM | POA: Diagnosis not present

## 2019-07-15 DIAGNOSIS — Z79899 Other long term (current) drug therapy: Secondary | ICD-10-CM

## 2019-07-15 DIAGNOSIS — R6250 Unspecified lack of expected normal physiological development in childhood: Secondary | ICD-10-CM | POA: Diagnosis present

## 2019-07-15 DIAGNOSIS — D509 Iron deficiency anemia, unspecified: Secondary | ICD-10-CM | POA: Diagnosis present

## 2019-07-15 DIAGNOSIS — J1289 Other viral pneumonia: Secondary | ICD-10-CM | POA: Diagnosis present

## 2019-07-15 DIAGNOSIS — J069 Acute upper respiratory infection, unspecified: Secondary | ICD-10-CM | POA: Diagnosis present

## 2019-07-15 DIAGNOSIS — U071 COVID-19: Principal | ICD-10-CM

## 2019-07-15 DIAGNOSIS — J45909 Unspecified asthma, uncomplicated: Secondary | ICD-10-CM | POA: Diagnosis present

## 2019-07-15 DIAGNOSIS — E039 Hypothyroidism, unspecified: Secondary | ICD-10-CM | POA: Diagnosis present

## 2019-07-15 DIAGNOSIS — J9601 Acute respiratory failure with hypoxia: Secondary | ICD-10-CM | POA: Diagnosis present

## 2019-07-15 DIAGNOSIS — I110 Hypertensive heart disease with heart failure: Secondary | ICD-10-CM | POA: Diagnosis present

## 2019-07-15 DIAGNOSIS — G40909 Epilepsy, unspecified, not intractable, without status epilepticus: Secondary | ICD-10-CM | POA: Diagnosis present

## 2019-07-15 DIAGNOSIS — R0902 Hypoxemia: Secondary | ICD-10-CM

## 2019-07-15 DIAGNOSIS — I5033 Acute on chronic diastolic (congestive) heart failure: Secondary | ICD-10-CM | POA: Diagnosis present

## 2019-07-15 DIAGNOSIS — J1282 Pneumonia due to coronavirus disease 2019: Secondary | ICD-10-CM

## 2019-07-15 LAB — CBC WITH DIFFERENTIAL/PLATELET
Abs Immature Granulocytes: 0.02 10*3/uL (ref 0.00–0.07)
Basophils Absolute: 0 10*3/uL (ref 0.0–0.1)
Basophils Relative: 0 %
Eosinophils Absolute: 0 10*3/uL (ref 0.0–0.5)
Eosinophils Relative: 0 %
HCT: 36.2 % (ref 36.0–46.0)
Hemoglobin: 12 g/dL (ref 12.0–15.0)
Immature Granulocytes: 0 %
Lymphocytes Relative: 17 %
Lymphs Abs: 1 10*3/uL (ref 0.7–4.0)
MCH: 31.6 pg (ref 26.0–34.0)
MCHC: 33.1 g/dL (ref 30.0–36.0)
MCV: 95.3 fL (ref 80.0–100.0)
Monocytes Absolute: 0.8 10*3/uL (ref 0.1–1.0)
Monocytes Relative: 13 %
Neutro Abs: 4 10*3/uL (ref 1.7–7.7)
Neutrophils Relative %: 70 %
Platelets: DECREASED 10*3/uL (ref 150–400)
RBC: 3.8 MIL/uL — ABNORMAL LOW (ref 3.87–5.11)
RDW: 12 % (ref 11.5–15.5)
WBC: 5.8 10*3/uL (ref 4.0–10.5)
nRBC: 0 % (ref 0.0–0.2)

## 2019-07-15 LAB — TSH: TSH: 1.02 u[IU]/mL (ref 0.350–4.500)

## 2019-07-15 LAB — FIBRINOGEN: Fibrinogen: 455 mg/dL (ref 210–475)

## 2019-07-15 LAB — TYPE AND SCREEN
ABO/RH(D): A POS
Antibody Screen: NEGATIVE

## 2019-07-15 LAB — D-DIMER, QUANTITATIVE
D-Dimer, Quant: 1.58 ug/mL-FEU — ABNORMAL HIGH (ref 0.00–0.50)
D-Dimer, Quant: 1.86 ug/mL-FEU — ABNORMAL HIGH (ref 0.00–0.50)

## 2019-07-15 LAB — TRIGLYCERIDES: Triglycerides: 110 mg/dL (ref <150)

## 2019-07-15 LAB — LACTIC ACID, PLASMA: Lactic Acid, Venous: 1.2 mmol/L (ref 0.5–1.9)

## 2019-07-15 MED ORDER — LISINOPRIL 10 MG PO TABS
10.0000 mg | ORAL_TABLET | Freq: Every day | ORAL | Status: DC
  Administered 2019-07-16 – 2019-07-20 (×5): 10 mg via ORAL
  Filled 2019-07-15 (×5): qty 1

## 2019-07-15 MED ORDER — ONDANSETRON HCL 4 MG PO TABS: 4.0000 mg | ORAL_TABLET | Freq: Four times a day (QID) | ORAL | Status: DC | PRN

## 2019-07-15 MED ORDER — HYDROCHLOROTHIAZIDE 12.5 MG PO CAPS
12.5000 mg | ORAL_CAPSULE | Freq: Every day | ORAL | Status: DC
  Administered 2019-07-16 – 2019-07-20 (×5): 12.5 mg via ORAL
  Filled 2019-07-15 (×5): qty 1

## 2019-07-15 MED ORDER — IPRATROPIUM-ALBUTEROL 20-100 MCG/ACT IN AERS
1.0000 | INHALATION_SPRAY | Freq: Four times a day (QID) | RESPIRATORY_TRACT | Status: DC
  Administered 2019-07-15 – 2019-07-20 (×19): 1 via RESPIRATORY_TRACT
  Filled 2019-07-15 (×2): qty 4

## 2019-07-15 MED ORDER — PHENYTOIN 125 MG/5ML PO SUSP: 125.0000 mg | Freq: Two times a day (BID) | ORAL | Status: DC

## 2019-07-15 MED ORDER — SODIUM CHLORIDE 0.9 % IV SOLN
100.0000 mg | INTRAVENOUS | Status: DC
  Filled 2019-07-15 (×2): qty 20

## 2019-07-15 MED ORDER — MONTELUKAST SODIUM 10 MG PO TABS
10.0000 mg | ORAL_TABLET | ORAL | Status: DC
  Administered 2019-07-17 – 2019-07-20 (×4): 10 mg via ORAL
  Filled 2019-07-15 (×4): qty 1

## 2019-07-15 MED ORDER — SODIUM CHLORIDE 0.9 % IV SOLN
200.0000 mg | Freq: Once | INTRAVENOUS | Status: AC
  Administered 2019-07-15: 200 mg via INTRAVENOUS
  Filled 2019-07-15: qty 40

## 2019-07-15 MED ORDER — LEVOTHYROXINE SODIUM 88 MCG PO TABS
88.0000 ug | ORAL_TABLET | Freq: Every day | ORAL | Status: DC
  Administered 2019-07-16 – 2019-07-20 (×5): 88 ug via ORAL
  Filled 2019-07-15 (×5): qty 1

## 2019-07-15 MED ORDER — GUAIFENESIN-DM 100-10 MG/5ML PO SYRP: 10.0000 mL | ORAL_SOLUTION | ORAL | Status: DC | PRN

## 2019-07-15 MED ORDER — ALBUTEROL SULFATE HFA 108 (90 BASE) MCG/ACT IN AERS
2.0000 | INHALATION_SPRAY | RESPIRATORY_TRACT | Status: DC | PRN
  Administered 2019-07-16 – 2019-07-20 (×2): 2 via RESPIRATORY_TRACT
  Filled 2019-07-15: qty 6.7

## 2019-07-15 MED ORDER — ACETAMINOPHEN 325 MG PO TABS: 650.0000 mg | ORAL_TABLET | Freq: Four times a day (QID) | ORAL | Status: DC | PRN

## 2019-07-15 MED ORDER — ENOXAPARIN SODIUM 40 MG/0.4ML ~~LOC~~ SOLN
40.0000 mg | SUBCUTANEOUS | Status: DC
  Administered 2019-07-15 – 2019-07-19 (×5): 40 mg via SUBCUTANEOUS
  Filled 2019-07-15 (×4): qty 0.4

## 2019-07-15 MED ORDER — VITAMIN C 500 MG PO TABS
500.0000 mg | ORAL_TABLET | Freq: Every day | ORAL | Status: DC
  Administered 2019-07-16 – 2019-07-20 (×5): 500 mg via ORAL
  Filled 2019-07-15 (×5): qty 1

## 2019-07-15 MED ORDER — ZINC SULFATE 220 (50 ZN) MG PO CAPS
220.0000 mg | ORAL_CAPSULE | Freq: Every day | ORAL | Status: DC
  Administered 2019-07-16 – 2019-07-20 (×5): 220 mg via ORAL
  Filled 2019-07-15 (×7): qty 1

## 2019-07-15 MED ORDER — HYDROCOD POLST-CPM POLST ER 10-8 MG/5ML PO SUER
5.0000 mL | Freq: Two times a day (BID) | ORAL | Status: DC | PRN
  Administered 2019-07-17: 5 mL via ORAL
  Filled 2019-07-15: qty 5

## 2019-07-15 MED ORDER — LISINOPRIL-HYDROCHLOROTHIAZIDE 10-12.5 MG PO TABS: 1.0000 | ORAL_TABLET | Freq: Every day | ORAL | Status: DC

## 2019-07-15 MED ORDER — ONDANSETRON HCL 4 MG/2ML IJ SOLN: 4.0000 mg | Freq: Four times a day (QID) | INTRAMUSCULAR | Status: DC | PRN

## 2019-07-15 MED ORDER — CARVEDILOL 3.125 MG PO TABS
3.1250 mg | ORAL_TABLET | Freq: Two times a day (BID) | ORAL | Status: DC
  Administered 2019-07-16 – 2019-07-20 (×7): 3.125 mg via ORAL
  Filled 2019-07-15 (×9): qty 1

## 2019-07-15 MED ORDER — SODIUM CHLORIDE 0.9 % IV SOLN
250.0000 mg | Freq: Two times a day (BID) | INTRAVENOUS | Status: DC
  Administered 2019-07-16 – 2019-07-19 (×7): 250 mg via INTRAVENOUS
  Filled 2019-07-15 (×10): qty 2.5

## 2019-07-15 MED ORDER — DEXAMETHASONE SODIUM PHOSPHATE 10 MG/ML IJ SOLN
6.0000 mg | INTRAMUSCULAR | Status: DC
  Administered 2019-07-15 – 2019-07-17 (×3): 6 mg via INTRAVENOUS
  Filled 2019-07-15 (×3): qty 1

## 2019-07-15 NOTE — ED Provider Notes (Signed)
[place] EMERGENCY DEPARTMENT Provider Note   CSN: [id number] Arrival date & time: 07/15/19  1625     History   Chief Complaint Chief Complaint  Patient presents with   Covid +    HPI Leslie Chandler is a 84 y.o. female past medical history significant for asthma, cognitive developmental delay, epilepsy, hypertension, MR, osteoporosis presents to emergency department today via EMS with chief complaint of Covid positive.  Patient was informed of positive Covid test today.  She is unable to contribute to history, level 5 caveat applies. She does not wear chronic O2.  Did not have any medications for symptoms prior to arrival.  Patient sister was contacted via phone and reports patient attends an adult daycare.  She was informed there was a Covid positive patient in the daycare so all daycare attendees were recommended to have Covid testing.  Unfortunately patient had positive Covid test.  She has had fever at home, unsure of T-max.  Patient has also been coughing and seemingly short of breath.  Patient's sister has been unable to visit patient as her spouse had recent surgery and with the current pandemic she wanted to take extra precautions to keep everyone safe.   Past Medical History:  Diagnosis Date   Asthma    Cognitive developmental delay    Epilepsia [facility])    Hypertension    Mental retardation    Osteoporosis     Patient Active Problem List   Diagnosis Date Noted   Chronic diastolic heart failure ([place]) 08/08/2015   Aspiration pneumonia ([place]) 08/06/2015   Sepsis ([place]) 08/05/2015   Diarrhea 08/05/2015   Type 2 diabetes mellitus ([place]) 08/05/2015   Lactic acidosis 08/05/2015   CAP (community acquired pneumonia) 04/12/2014   Mental retardation    Hypertension    Asthma    HCAP (healthcare-associated pneumonia) 04/11/2014    Past Surgical History:  Procedure Laterality Date   BREAST RECONSTRUCTION     DENTAL SURGERY      REDUCTION MAMMAPLASTY Bilateral      OB History   No obstetric history on file.      Home Medications    Prior to Admission medications   Medication Sig Start Date End Date Taking? Authorizing Provider  albuterol (PROAIR HFA) 108 (90 Base) MCG/ACT inhaler Inhale 2 puffs into the lungs every 4 (four) hours as needed for wheezing or shortness of breath. Use with spacer    [provider]  albuterol (PROVENTIL) (2.5 MG/3ML) 0.083% nebulizer solution Take 2.5 mg by nebulization every 4 (four) hours as needed for wheezing or shortness of breath.     [provider]  aspirin 81 MG chewable tablet Chew 81 mg by mouth daily.     [provider]  Calcium-Vitamin D-Vitamin K (VIACTIV PO) Take 1 tablet by mouth every morning.    [provider]  carvedilol (COREG) 3.125 MG tablet Take 1 tablet (3.125 mg total) by mouth 2 (two) times daily with a meal. 08/08/15   Annita Brod, MD  Cholecalciferol (VITAMIN D3) 400 UNIT/ML LIQD Take 1,200 Units by mouth daily.    [provider]  dextromethorphan 15 MG/5ML syrup Take 5 mLs by mouth every 12 (twelve) hours.    [provider]  ferrous sulfate 220 (44 FE) MG/5ML solution Take 176 mg by mouth at bedtime.     [provider]  fluticasone (FLOVENT HFA) 110 MCG/ACT inhaler Inhale 2 puffs into the lungs 2 (two) times daily.  [provider]  guaiFENesin (ROBITUSSIN) 100 MG/5ML liquid Take 5-10 mLs (100-200 mg total) by mouth every 4 (four) hours as needed for cough. Patient not taking: Reported on 11/04/2018 08/26/17   Azalia Bilis, MD  levothyroxine (SYNTHROID, LEVOTHROID) 88 MCG tablet Take 88 mcg by mouth daily at 12 noon.     [provider]  lisinopril-hydrochlorothiazide (PRINZIDE,ZESTORETIC) 10-12.5 MG per tablet Take 1 tablet by mouth daily.    [provider]  montelukast (SINGULAIR) 10 MG tablet Take 10 mg by mouth every morning.     [provider]  Phenylephrine-Cocoa Butter (QC HEMORRHOIDAL RE) Place 1 application rectally 2 (two) times daily as needed (hemorrhoids). ointment    [provider]  phenytoin (DILANTIN) 125 MG/5ML suspension Take 125 mg by mouth 2 (two) times daily.    [provider]  raloxifene (EVISTA) 60 MG tablet Take 60 mg by mouth daily.    [provider]    Family History History reviewed. No pertinent family history.  Social History Social History   Tobacco Use   Smoking status: Never Smoker   Smokeless tobacco: Never Used  Substance Use Topics   Alcohol use: No   Drug use: No     Allergies   Patient has no known allergies.   Review of Systems Review of Systems  Unable to perform ROS: Other (MR)     Physical Exam Updated Vital Signs BP 120/67 (BP Location: Left Arm)    Pulse 76    Temp 100.1 F (37.8 C) (Oral)    Resp 18    SpO2 96%   Physical Exam Vitals signs and nursing note reviewed.  Constitutional:      General: She is not in acute distress.    Appearance: She is ill-appearing.  HENT:     Head: Normocephalic and atraumatic.     Right Ear: Tympanic membrane and external ear normal.     Left Ear: Tympanic membrane and external ear normal.     Nose: Nose normal.     Mouth/Throat:     Mouth: Mucous membranes are moist.     Pharynx: Oropharynx is clear.  Eyes:     General: No scleral icterus.       Right eye: No discharge.        Left eye: No discharge.     Extraocular Movements: Extraocular movements intact.     Conjunctiva/sclera: Conjunctivae normal.     Pupils: Pupils are equal, round, and reactive to light.  Neck:     Musculoskeletal: Normal range of motion.     Vascular: No JVD.  Cardiovascular:     Rate and Rhythm: Normal rate and regular rhythm.     Pulses: Normal pulses.          Radial pulses are 2+ on the right side and 2+ on the left side.     Heart sounds: Normal heart sounds.  Pulmonary:     Comments: Hypoxic to 90% on  arrival.  Placed on 3 L nasal cannula with improvement to 95%.  Lungs with diffuse wheezing and rhonchi heard.  Mild respiratory distress Abdominal:     Comments: Abdomen is soft, non-distended, and non-tender in all quadrants. No rigidity, no guarding. No peritoneal signs.  Musculoskeletal: Normal range of motion.  Skin:    General: Skin is warm and dry.     Capillary Refill: Capillary refill takes less than 2 seconds.  Neurological:     Mental Status: She is oriented to  person, place, and time.     GCS: GCS eye subscore is 4. GCS verbal subscore is 5. GCS motor subscore is 6.     Comments: Fluent speech, no facial droop.  Psychiatric:        Behavior: Behavior normal.      ED Treatments / Results  Labs (all labs ordered are listed, but only abnormal results are displayed) Labs Reviewed  CBC WITH DIFFERENTIAL/PLATELET - Abnormal; Notable for the following components:      Result Value   RBC 3.80 (*)    All other components within normal limits  CULTURE, BLOOD (ROUTINE X 2)  CULTURE, BLOOD (ROUTINE X 2)  LACTIC ACID, PLASMA  TRIGLYCERIDES  LACTIC ACID, PLASMA    EKG EKG Interpretation  Date/Time:  Friday July 15 2019 16:38:42 EST Ventricular Rate:  79 PR Interval:    QRS Duration: 89 QT Interval:  378 QTC Calculation: 434 R Axis:   39 Text Interpretation: Sinus rhythm Short PR interval Low voltage, precordial leads Minimal ST depression, diffuse leads No significant change since last tracing Confirmed by Gwyneth Sproutlunkett, Whitney ([id number]) on 07/15/2019 4:46:09 PM   Radiology Dg Chest Port 1 View  Result Date: 07/15/2019 CLINICAL DATA:  Shortness of breath. EXAM: PORTABLE CHEST 1 VIEW COMPARISON:  November 04, 2018. FINDINGS: Stable cardiomegaly. No pneumothorax is noted. Interval development of patchy bilateral airspace opacities are noted concerning for multifocal pneumonia. No significant pleural effusion is noted. Bony thorax is unremarkable. IMPRESSION: Interval  development of patchy bilateral airspace opacities are noted concerning for multifocal pneumonia. Followup radiographs are recommended until resolution. No significant pleural effusion is noted. Electronically Signed   By: Lupita RaiderJames  Green Jr M.D.   On: 07/15/2019 19:21    Procedures .Critical Care Performed by: Sherene SiresAlbrizze, Travonna Swindle E, PA-C Authorized by: Sherene SiresAlbrizze, Sarabelle Genson E, PA-C   Critical care provider statement:    Critical care time (minutes):  34   Critical care time was exclusive of:  Separately billable procedures and treating other patients and teaching time   Critical care was necessary to treat or prevent imminent or life-threatening deterioration of the following conditions:  Respiratory failure   Critical care was time spent personally by me on the following activities:  Development of treatment plan with patient or surrogate, discussions with consultants, evaluation of patient's response to treatment, examination of patient, obtaining history from patient or surrogate, ordering and performing treatments and interventions, ordering and review of laboratory studies, ordering and review of radiographic studies, re-evaluation of patient's condition, pulse oximetry and review of old charts   I assumed direction of critical care for this patient from another provider in my specialty: no     (including critical care time)  Medications Ordered in ED Medications - No data to display   Initial Impression / Assessment and Plan / ED Course  I have reviewed the triage vital signs and the nursing notes.  Pertinent labs & imaging results that were available during my care of the patient were reviewed by me and considered in my medical decision making (see chart for details).  Patient seen and examined.  She had a positive Cov[facility] test today at [facility] urgent care.  On arrival she has low-grade temp to 100.  She was hypoxic to 90% on room air.  She has diffuse wheezing and rhonchi heard.  Patient  put on 3 L and hypoxia improved, SPO2 is 95%.  CBC with no leukocytosis, no anemia.  CMP is pending.  Lactic acid within normal  range.  Chest x-ray viewed by me shows patchy bilateral opacities suggestive of pneumonia versus Covid. Given patient's hypoxia and positive Covid let us feel that she would benefit from admission.  This case was discussed with Dr. Stevie Kern who has seen the patient and agrees with plan to admit. CMP hemolyzed, RN recollecting. Hospitalist to follow results. Spoke with Dr. Allena Katz with hospitalist service who agrees to assume care of patient and bring into the hospital for further evaluation and management.  This case was discussed with Dr. Stevie Kern who has seen the patient and agrees with plan to admit. Patient's sister updated on plan of care.   Leslie Chandler was evaluated in Emergency Department on 07/15/2019 for the symptoms described in the history of present illness. She was evaluated in the context of the global COVID-19 pandemic, which necessitated consideration that the patient might be at risk for infection with the SARS-CoV-2 virus that causes COVID-19. Institutional protocols and algorithms that pertain to the evaluation of patients at risk for COVID-19 are in a state of rapid change based on information released by regulatory bodies including the CDC and federal and state organizations. These policies and algorithms were followed during the patient's care in the ED.     Portions of this note were generated with Scientist, clinical (histocompatibility and immunogenetics). Dictation errors may occur despite best attempts at proofreading.   Final Clinical Impressions(s) / ED Diagnoses   Final diagnoses:  COVID-19 virus infection  Hypoxia    ED Discharge Orders    None       Kathyrn Lass 07/15/19 2117    Milagros Loll, MD 07/16/19 1131

## 2019-07-15 NOTE — H&P (Addendum)
History and Physical    Leslie DeedJoan Maynes WUJ:[id number] DOB: 1935/03/20 DOA: 07/15/2019  PCP: Jordan HawksGordon, Sarah B, PA-C  Patient coming from: Home  I have personally briefly reviewed patient's [facility]  Chief Complaint: COVID-19 infection  HPI: Leslie Chandler is a 84 y.o. female with medical history significant for intellectual disability with severe cognitive impairment, chronic diastolic CHF, seizure disorder, asthma, hypertension, hyperlipidemia, dysphagia, and chronically wheelchair-bound who presents to the ED for evaluation of cough, fever at home, and increased work of breathing with positive COVID-19 test 07/15/2019.  Patient has significant cognitive impairment therefore is unable to provide history.  Entirety of history is obtained from EDP, chart review, and sister by phone.  Patient has 24/7 nursing care and is active with [facility] group home.  She reported exposure to COVID-19 2 days ago.  She had a SARS-CoV-2 BD antigen test obtained at [facility] care today which returned positive.  Patient had reported fever at home, new cough, and increased work of breathing.  EMS were called and she was noted to have O2 saturation of 93% on room air.  She is brought to the ED for further evaluation.  Per sister, patient has severe cognitive impairment and is essentially noncommunicative.  She is wheelchair-bound and unable to ambulate on her own.  ED Course:  Initial vitals showed BP 119/60, pulse 76, RR 22, temp 100.1 Fahrenheit, SPO2 95% on 2 L supplemental O2 via [place].  Per EDP O2 saturation drops to 89% on room air.  Labs are notable for WBC 5.8, hemoglobin 12.0, platelet clumps noted on smear, lactic acid 1.2.  Blood cultures were obtained and pending.  Portable chest x-ray showed enlarged cardiac silhouette with bilateral airspace opacities.  The hospitalist service was consulted today for further evaluation and management.  Review of Systems:  Unable to  obtain full review of systems due to patient's cognitive impairment.   Past Medical History:  Diagnosis Date  . Asthma   . Cognitive developmental delay   . Epilepsia (HCC)   . Hypertension   . Mental retardation   . Osteoporosis     Past Surgical History:  Procedure Laterality Date  . BREAST RECONSTRUCTION    . DENTAL SURGERY    . REDUCTION MAMMAPLASTY Bilateral     Social History:  reports that she has never smoked. She has never used smokeless tobacco. She reports that she does not drink alcohol or use drugs.  No Known Allergies  Family History  Problem Relation Age of Onset  . Cancer Mother      Prior to Admission medications   Medication Sig Start Date End Date Taking? Authorizing Provider  albuterol (PROAIR HFA) 108 (90 Base) MCG/ACT inhaler Inhale 2 puffs into the lungs every 4 (four) hours as needed for wheezing or shortness of breath. Use with spacer    [provider]  albuterol (PROVENTIL) (2.5 MG/3ML) 0.083% nebulizer solution Take 2.5 mg by nebulization every 4 (four) hours as needed for wheezing or shortness of breath.     [provider]  aspirin 81 MG chewable tablet Chew 81 mg by mouth daily.     [provider]  Calcium-Vitamin D-Vitamin K (VIACTIV PO) Take 1 tablet by mouth every morning.    [provider]  carvedilol (COREG) 3.125 MG tablet Take 1 tablet (3.125 mg total) by mouth 2 (two) times daily with a meal. 08/08/15   Hollice EspyKrishnan, Sendil K, MD  Cholecalciferol (VITAMIN D3) 400 UNIT/ML LIQD  Take 1,200 Units by mouth daily.    [provider]  dextromethorphan 15 MG/5ML syrup Take 5 mLs by mouth every 12 (twelve) hours.    [provider]  ferrous sulfate 220 (44 FE) MG/5ML solution Take 176 mg by mouth at bedtime.     [provider]  fluticasone (FLOVENT HFA) 110 MCG/ACT inhaler Inhale 2 puffs into the lungs 2 (two) times daily.    [provider]  guaiFENesin (ROBITUSSIN) 100  MG/5ML liquid Take 5-10 mLs (100-200 mg total) by mouth every 4 (four) hours as needed for cough. Patient not taking: Reported on 11/04/2018 08/26/17   Jola Schmidt, MD  levothyroxine (SYNTHROID, LEVOTHROID) 88 MCG tablet Take 88 mcg by mouth daily at 12 noon.     [provider]  lisinopril-hydrochlorothiazide (PRINZIDE,ZESTORETIC) 10-12.5 MG per tablet Take 1 tablet by mouth daily.    [provider]  montelukast (SINGULAIR) 10 MG tablet Take 10 mg by mouth every morning.     [provider]  Phenylephrine-Cocoa Butter (QC HEMORRHOIDAL RE) Place 1 application rectally 2 (two) times daily as needed (hemorrhoids). ointment    [provider]  phenytoin (DILANTIN) 125 MG/5ML suspension Take 125 mg by mouth 2 (two) times daily.    [provider]  raloxifene (EVISTA) 60 MG tablet Take 60 mg by mouth daily.    [provider]    Physical Exam: Vitals:   07/15/19 1642 07/15/19 1643 07/15/19 1645 07/15/19 1700  BP: 120/67  118/72 119/60  Pulse: 76     Resp: 18  (!) 21 (!) 22  Temp: 100.1 F (37.8 C)     TempSrc: Oral     SpO2: 90% 96% 95% 95%   Exam limited due to patient's cognitive impairment. Constitutional: Elderly woman resting in the right lateral decubitus position in bed, NAD, calm, comfortable Eyes: PERRL, lids and conjunctivae normal ENMT: Mucous membranes are dry. Posterior pharynx clear of any exudate or lesions.Normal dentition.  Neck: normal, supple, no masses. Respiratory: Coarse expiratory breath sounds bilaterally. Normal respiratory effort. No accessory muscle use.  Cardiovascular: Regular rate and rhythm, no murmurs / rubs / gallops. No extremity edema.  Abdomen: no tenderness, no masses palpated. No hepatosplenomegaly. Bowel sounds positive.  Musculoskeletal: no clubbing / cyanosis. No joint deformity upper and lower extremities. Normal muscle tone.  Skin: no rashes, lesions, ulcers. No induration Neurologic: Limited  exam due to cognitive impairment, moving upper extremities spontaneously, will open mouth on command otherwise not really following commands. Psychiatric: Unable to assess due to underlying cognitive impairment.    Labs on Admission: I have personally reviewed following labs and imaging studies  CBC: Recent Labs  Lab 07/15/19 1854  WBC 5.8  NEUTROABS 4.0  HGB 12.0  HCT 36.2  MCV 95.3  PLT PLATELET CLUMPS NOTED ON SMEAR, COUNT APPEARS DECREASED   Basic Metabolic Panel: No results for input(s): NA, K, CL, CO2, GLUCOSE, BUN, CREATININE, CALCIUM, MG, PHOS in the last 168 hours. GFR: CrCl cannot be calculated (Patient's most recent lab result is older than the maximum 21 days allowed.). Liver Function Tests: No results for input(s): AST, ALT, ALKPHOS, BILITOT, PROT, ALBUMIN in the last 168 hours. No results for input(s): LIPASE, AMYLASE in the last 168 hours. No results for input(s): AMMONIA in the last 168 hours. Coagulation Profile: No results for input(s): INR, PROTIME in the last 168 hours. Cardiac Enzymes: No results for input(s): CKTOTAL, CKMB, CKMBINDEX, TROPONINI in the last 168 hours. BNP (last 3 results)  No results for input(s): PROBNP in the last 8760 hours. HbA1C: No results for input(s): HGBA1C in the last 72 hours. CBG: No results for input(s): GLUCAP in the last 168 hours. Lipid Profile: Recent Labs    07/15/19 1854  TRIG 110   Thyroid Function Tests: No results for input(s): TSH, T4TOTAL, FREET4, T3FREE, THYROIDAB in the last 72 hours. Anemia Panel: No results for input(s): VITAMINB12, FOLATE, FERRITIN, TIBC, IRON, RETICCTPCT in the last 72 hours. Urine analysis:    Component Value Date/Time   COLORURINE YELLOW 11/04/2018 1012   APPEARANCEUR CLEAR 11/04/2018 1012   LABSPEC 1.013 11/04/2018 1012   PHURINE 6.0 11/04/2018 1012   GLUCOSEU NEGATIVE 11/04/2018 1012   HGBUR NEGATIVE 11/04/2018 1012   BILIRUBINUR NEGATIVE 11/04/2018 1012   KETONESUR NEGATIVE  11/04/2018 1012   PROTEINUR NEGATIVE 11/04/2018 1012   NITRITE NEGATIVE 11/04/2018 1012   LEUKOCYTESUR NEGATIVE 11/04/2018 1012    Radiological Exams on Admission: Dg Chest Port 1 View  Result Date: 07/15/2019 CLINICAL DATA:  Shortness of breath. EXAM: PORTABLE CHEST 1 VIEW COMPARISON:  November 04, 2018. FINDINGS: Stable cardiomegaly. No pneumothorax is noted. Interval development of patchy bilateral airspace opacities are noted concerning for multifocal pneumonia. No significant pleural effusion is noted. Bony thorax is unremarkable. IMPRESSION: Interval development of patchy bilateral airspace opacities are noted concerning for multifocal pneumonia. Followup radiographs are recommended until resolution. No significant pleural effusion is noted. Electronically Signed   By: Lupita Raider M.D.   On: 07/15/2019 19:21    EKG: Independently reviewed. Sinus rhythm without acute ischemic changes.  Not significantly changed when compared to prior.  Assessment/Plan Principal Problem:   Acute respiratory disease due to COVID-19 virus Active Problems:   Hypertension   Asthma   Chronic diastolic heart failure (HCC)   Seizure disorder (HCC)   Hypothyroidism   Hyperlipidemia  Amritha Yorke is a 84 y.o. female with medical history significant for intellectual disability with severe cognitive impairment, chronic diastolic CHF, seizure disorder, asthma, hypertension, hyperlipidemia, dysphagia, and chronically wheelchair-bound who is admitted with acute respiratory failure with hypoxia due to COVID-19 infection.   Acute respiratory failure with hypoxia due to COVID-19 infection with pneumonia: -Positive SARS-CoV-2 BD antigen test 07/15/2019 -Chest x-ray with bilateral pulmonary opacities -New O2 requirement of 2-3 L supplemental O2 via [place], desaturates to 89-90% on room air -Will admit to [facility] as she is requiring <4L supplemental O2 via [place] -Start IV Decadron 6 mg daily -Start IV remdesivir per  pharmacy -Continue Combivent, as needed albuterol -Continue flutter valve and incentive spirometer as able -Continue antitussives, vitamin C, zinc  Asthma: Continue Combivent, as needed albuterol, Singulair  Chronic diastolic CHF: Appears euvolemic on admission.  Last EF 65-70%.  Continue Coreg.  She is not requiring diuretics as an outpatient.  Seizure disorder: Continue phenytoin. ADDENDUM: Per pharmacy, patient is on Keppra rather than phenytoin as an outpatient.  Will start Keppra, D/C phenytoin.  Can use IV formulation of Keppra if having increased aspiration risk.  Hypertension: Continue Coreg and lisinopril-HCTZ.  Hypothyroidism: Continue Synthroid.  Dysphagia: Continue dysphagia 2 diet.  Will request SLP eval.  Intellectual disability with severe cognitive impairment/wheelchair-bound: Chronic and stable.  Transfer to chair as able.  DVT prophylaxis: Lovenox Code Status: DNR, confirmed with sister Driscilla Moats by phone [phone] Family Communication: Discussed with sister by phone Disposition Plan: Pending clinical progress Consults called: None Admission status: Inpatient for management of acute hypoxic respiratory failure due to COVID-19 infection.   Darreld Mclean MD  [facility]  If 7PM-7AM, please contact night-coverage [email or web address]  07/15/2019, 8:39 PM

## 2019-07-15 NOTE — ED Triage Notes (Addendum)
Per [facility] pt from home. Nurse at to her house 24/7 for MR. Pt's covid test came back positive today. Pt not exhibiting any sx currently.  Pt mental status at baseline per home health nurse. Home healthcare nurse wanted her checked out. 140/70 HR 80 93% RA 99.1 Temp.

## 2019-07-16 ENCOUNTER — Other Ambulatory Visit: Payer: Self-pay

## 2019-07-16 DIAGNOSIS — G40909 Epilepsy, unspecified, not intractable, without status epilepticus: Secondary | ICD-10-CM

## 2019-07-16 DIAGNOSIS — I5032 Chronic diastolic (congestive) heart failure: Secondary | ICD-10-CM

## 2019-07-16 DIAGNOSIS — U071 COVID-19: Secondary | ICD-10-CM | POA: Diagnosis present

## 2019-07-16 DIAGNOSIS — I10 Essential (primary) hypertension: Secondary | ICD-10-CM

## 2019-07-16 LAB — CBC WITH DIFFERENTIAL/PLATELET
Abs Immature Granulocytes: 0.01 10*3/uL (ref 0.00–0.07)
Basophils Absolute: 0 10*3/uL (ref 0.0–0.1)
Basophils Relative: 0 %
Eosinophils Absolute: 0 10*3/uL (ref 0.0–0.5)
Eosinophils Relative: 0 %
HCT: 33.1 % — ABNORMAL LOW (ref 36.0–46.0)
Hemoglobin: 11 g/dL — ABNORMAL LOW (ref 12.0–15.0)
Immature Granulocytes: 0 %
Lymphocytes Relative: 24 %
Lymphs Abs: 0.9 10*3/uL (ref 0.7–4.0)
MCH: 31.3 pg (ref 26.0–34.0)
MCHC: 33.2 g/dL (ref 30.0–36.0)
MCV: 94.3 fL (ref 80.0–100.0)
Monocytes Absolute: 0.5 10*3/uL (ref 0.1–1.0)
Monocytes Relative: 12 %
Neutro Abs: 2.5 10*3/uL (ref 1.7–7.7)
Neutrophils Relative %: 64 %
Platelets: DECREASED 10*3/uL (ref 150–400)
RBC: 3.51 MIL/uL — ABNORMAL LOW (ref 3.87–5.11)
RDW: 11.9 % (ref 11.5–15.5)
WBC: 4 10*3/uL (ref 4.0–10.5)
nRBC: 0 % (ref 0.0–0.2)

## 2019-07-16 LAB — COMPREHENSIVE METABOLIC PANEL
ALT: 16 U/L (ref 0–44)
ALT: 15 U/L (ref 0–44)
AST: 36 U/L (ref 15–41)
AST: 25 U/L (ref 15–41)
Albumin: 2.7 g/dL — ABNORMAL LOW (ref 3.5–5.0)
Albumin: 2.6 g/dL — ABNORMAL LOW (ref 3.5–5.0)
Alkaline Phosphatase: 56 U/L (ref 38–126)
Alkaline Phosphatase: 60 U/L (ref 38–126)
Anion gap: 13 (ref 5–15)
Anion gap: 11 (ref 5–15)
BUN: 20 mg/dL (ref 8–23)
BUN: 20 mg/dL (ref 8–23)
CO2: 22 mmol/L (ref 22–32)
CO2: 27 mmol/L (ref 22–32)
Calcium: 7.5 mg/dL — ABNORMAL LOW (ref 8.9–10.3)
Calcium: 7.7 mg/dL — ABNORMAL LOW (ref 8.9–10.3)
Chloride: 98 mmol/L (ref 98–111)
Chloride: 98 mmol/L (ref 98–111)
Creatinine, Ser: 0.87 mg/dL (ref 0.44–1.00)
Creatinine, Ser: 0.97 mg/dL (ref 0.44–1.00)
GFR calc Af Amer: >60 mL/min (ref >60)
GFR calc Af Amer: >60 mL/min (ref >60)
GFR calc non Af Amer: >60 mL/min (ref >60)
GFR calc non Af Amer: 54 mL/min — ABNORMAL LOW (ref >60)
Glucose, Bld: 96 mg/dL (ref 70–99)
Glucose, Bld: 104 mg/dL — ABNORMAL HIGH (ref 70–99)
Potassium: 4 mmol/L (ref 3.5–5.1)
Potassium: 3.5 mmol/L (ref 3.5–5.1)
Sodium: 133 mmol/L — ABNORMAL LOW (ref 135–145)
Sodium: 136 mmol/L (ref 135–145)
Total Bilirubin: 1.2 mg/dL (ref 0.3–1.2)
Total Bilirubin: 0.6 mg/dL (ref 0.3–1.2)
Total Protein: 5.5 g/dL — ABNORMAL LOW (ref 6.5–8.1)
Total Protein: 5.9 g/dL — ABNORMAL LOW (ref 6.5–8.1)

## 2019-07-16 LAB — D-DIMER, QUANTITATIVE: D-Dimer, Quant: 1.1 ug/mL-FEU — ABNORMAL HIGH (ref 0.00–0.50)

## 2019-07-16 LAB — FERRITIN
Ferritin: 236 ng/mL (ref 11–307)
Ferritin: 280 ng/mL (ref 11–307)

## 2019-07-16 LAB — LACTATE DEHYDROGENASE: LDH: 323 U/L — ABNORMAL HIGH (ref 98–192)

## 2019-07-16 LAB — PHOSPHORUS: Phosphorus: 2.7 mg/dL (ref 2.5–4.6)

## 2019-07-16 LAB — PROCALCITONIN: Procalcitonin: <0.1 ng/mL

## 2019-07-16 LAB — C-REACTIVE PROTEIN
CRP: 8.2 mg/dL — ABNORMAL HIGH (ref <1.0)
CRP: 9.7 mg/dL — ABNORMAL HIGH (ref <1.0)

## 2019-07-16 LAB — MAGNESIUM: Magnesium: 1.7 mg/dL (ref 1.7–2.4)

## 2019-07-16 MED ORDER — ENSURE ENLIVE PO LIQD
237.0000 mL | Freq: Two times a day (BID) | ORAL | Status: DC
  Administered 2019-07-17 – 2019-07-20 (×8): 237 mL via ORAL

## 2019-07-16 MED ORDER — SODIUM CHLORIDE 0.9 % IV SOLN
INTRAVENOUS | Status: DC | PRN
  Administered 2019-07-16: 1000 mL via INTRAVENOUS

## 2019-07-16 MED ORDER — SODIUM CHLORIDE 0.9 % IV SOLN
100.0000 mg | Freq: Every day | INTRAVENOUS | Status: AC
  Administered 2019-07-16 – 2019-07-19 (×4): 100 mg via INTRAVENOUS
  Filled 2019-07-16 (×2): qty 100
  Filled 2019-07-16: qty 20
  Filled 2019-07-16: qty 100

## 2019-07-16 NOTE — ED Notes (Signed)
MS   Breakfast ordered  

## 2019-07-16 NOTE — ED Notes (Signed)
Lunch Tray Ordered @ 1029. 

## 2019-07-16 NOTE — ED Notes (Signed)
Report called  

## 2019-07-16 NOTE — Progress Notes (Signed)
[facility]  PROGRESS NOTE  Leslie Chandler NGE:[id number] DOB: March 20, 1935 DOA: 07/15/2019 PCP: Jordan Hawks, PA-C   Brief HPI:   84 year old female with history of intellectual disability, severe cognitive impairment, chronic diastolic CHF, seizure disorder, asthma, hypertension, hyperlipidemia, dysphagia, chronically wheelchair-bound came to ED for evaluation of cough, fever at home with increased work of breathing.  She was found to have positive COVID-19 test on 07/15/2019.  In the ED patient required 2 L of oxygen, SPO2 was 95%,.  Per EDP O2 sats dropped to 89% on room air.  Chest x-ray showed bilateral airspace opacities.  Patient started on remdesivir and Decadron.    Subjective   Patient seen and examined, lethargic this morning.   Assessment/Plan:     1. Acute respiratory failure with hypoxia-secondary to COVID-19 infection with pneumonia, chest x-ray showed bilateral pulmonary infiltrates.  New oxygen requirement 2 to 3 L/min.  Started on remdesivir, Decadron.  Albuterol inhaler as needed.  Vitamin C, zinc.  Incentive spirometer as able, flutter valve.  Patient will be transferred to [facility].    COVID-19 Labs  Recent Labs    07/15/19 2047 07/15/19 2048 07/16/19 0505  DDIMER 1.58* 1.86* 1.10*  FERRITIN 236  --  280  LDH 323*  --   --   CRP 8.2*  --  9.7*    Chronic diastolic CHF-appears euvolemic.  Continue Coreg.  Patient is not on diuretics as outpatient.   Seizure disorder-continue Keppra.  As per pharmacy patient was not on phenytoin which was discontinued.  Hypertension-continue Coreg, lisinopril/HCTZ  Hypothyroidism-continue Synthroid.  Dysphagia-currently on dysphagia 2 diet.  Speech therapy evaluation requested.  Intellectual disability with severe cognitive impairment/wheelchair-bound-stable      CBC: Recent Labs  Lab 07/15/19 1854 07/16/19 0505  WBC 5.8 4.0  NEUTROABS 4.0 2.5  HGB 12.0 11.0*  HCT 36.2 33.1*  MCV 95.3 94.3   PLT PLATELET CLUMPS NOTED ON SMEAR, COUNT APPEARS DECREASED PLATELET CLUMPS NOTED ON SMEAR, COUNT APPEARS DECREASED    Basic Metabolic Panel: Recent Labs  Lab 07/15/19 2047 07/16/19 0505  NA 133* 136  K 4.0 3.5  CL 98 98  CO2 22 27  GLUCOSE 96 104*  BUN 20 20  CREATININE 0.87 0.97  CALCIUM 7.5* 7.7*  MG  --  1.7  PHOS  --  2.7       DVT prophylaxis: Lovenox  Code Status: Full code  Family Communication: No family at bedside  Disposition Plan: likely home when medically ready for discharge        BMI  Estimated body mass index is 39.23 kg/m as calculated from the following:   Height as of 08/06/15: 4\' 8"  (1.422 m).   Weight as of 09/01/16: 79.4 kg.  Scheduled medications:  . carvedilol  3.125 mg Oral BID WC  . dexamethasone (DECADRON) injection  6 mg Intravenous Q24H  . enoxaparin (LOVENOX) injection  40 mg Subcutaneous Q24H  . hydrochlorothiazide  12.5 mg Oral Daily  . Ipratropium-Albuterol  1 puff Inhalation Q6H  . levothyroxine  88 mcg Oral Q1200  . lisinopril  10 mg Oral Daily  . montelukast  10 mg Oral BH-q7a  . vitamin C  500 mg Oral Daily  . zinc sulfate  220 mg Oral Daily    Consultants:    Procedures:     Antibiotics:   Anti-infectives (From admission, onward)   Start     Dose/Rate Route Frequency Ordered Stop   07/16/19 2121  remdesivir 100 mg in  sodium chloride 0.9 % 250 mL IVPB     100 mg 500 mL/hr over 30 Minutes Intravenous Every 24 hours 07/15/19 2121 07/20/19 2129   07/15/19 2130  remdesivir 200 mg in sodium chloride 0.9 % 250 mL IVPB     200 mg 500 mL/hr over 30 Minutes Intravenous Once 07/15/19 2121 07/16/19 0039       Objective   Vitals:   07/16/19 0615 07/16/19 0630 07/16/19 0830 07/16/19 0900  BP:   (!) 142/74 (!) 150/67  Pulse:   86 76  Resp: 20 (!) 21 16 19   Temp:      TempSrc:      SpO2:   93% 94%   No intake or output data in the 24 hours ending 07/16/19 1032 There were no vitals filed for this  visit.   Physical Examination:    General: Appears lethargic  Cardiovascular: S1-S2, regular, no murmur auscultated  Respiratory: Clear to auscultation bilaterally  Abdomen: Abdomen is soft, nontender  Extremities: No edema in the lower extremities  Neurologic: Alert, not oriented x3     Data Reviewed: I have personally reviewed following labs and imaging studies   No results found for this or any previous visit (from the past 240 hour(s)).   Liver Function Tests: Recent Labs  Lab 07/15/19 2047 07/16/19 0505  AST 36 25  ALT 16 15  ALKPHOS 56 60  BILITOT 1.2 0.6  PROT 5.5* 5.9*  ALBUMIN 2.7* 2.6*      Studies: Dg Chest Port 1 View  Result Date: 07/15/2019 CLINICAL DATA:  Shortness of breath. EXAM: PORTABLE CHEST 1 VIEW COMPARISON:  November 04, 2018. FINDINGS: Stable cardiomegaly. No pneumothorax is noted. Interval development of patchy bilateral airspace opacities are noted concerning for multifocal pneumonia. No significant pleural effusion is noted. Bony thorax is unremarkable. IMPRESSION: Interval development of patchy bilateral airspace opacities are noted concerning for multifocal pneumonia. Followup radiographs are recommended until resolution. No significant pleural effusion is noted. Electronically Signed   By: Marijo Conception M.D.   On: 07/15/2019 19:21     Admission status: Inpatient: Based on patients clinical presentation and evaluation of above clinical data, I have made determination that patient meets Inpatient criteria at this time.   [facility] Pager [phone]. If 7PM-7AM, please contact night-coverage at [email or web address], Office  [phone]  password [place]  07/16/2019, 10:32 AM  LOS: 1 day

## 2019-07-16 NOTE — ED Notes (Signed)
Attempted report 

## 2019-07-16 NOTE — Progress Notes (Signed)
SLP Note:  Order received for swallow evaluation while at [facility] ED, but pt transferred to [facility] today before evaluation could be completed. Note that she is on a PO diet. SLP will f/u to complete evaluation as soon as able (may be Monday when we are next available on this campus). Please contact weekend SLP pager [phone]) if more urgent needs arise.   Pollyann Glen, M.A. [place] Acute Environmental education officer [phone] Office [phone]

## 2019-07-17 DIAGNOSIS — J1289 Other viral pneumonia: Secondary | ICD-10-CM

## 2019-07-17 LAB — COMPREHENSIVE METABOLIC PANEL
ALT: 18 U/L (ref 0–44)
AST: 29 U/L (ref 15–41)
Albumin: 2.9 g/dL — ABNORMAL LOW (ref 3.5–5.0)
Alkaline Phosphatase: 60 U/L (ref 38–126)
Anion gap: 11 (ref 5–15)
BUN: 25 mg/dL — ABNORMAL HIGH (ref 8–23)
CO2: 23 mmol/L (ref 22–32)
Calcium: 7.7 mg/dL — ABNORMAL LOW (ref 8.9–10.3)
Chloride: 100 mmol/L (ref 98–111)
Creatinine, Ser: 0.67 mg/dL (ref 0.44–1.00)
GFR calc Af Amer: >60 mL/min (ref >60)
GFR calc non Af Amer: >60 mL/min (ref >60)
Glucose, Bld: 134 mg/dL — ABNORMAL HIGH (ref 70–99)
Potassium: 3.5 mmol/L (ref 3.5–5.1)
Sodium: 134 mmol/L — ABNORMAL LOW (ref 135–145)
Total Bilirubin: 0.7 mg/dL (ref 0.3–1.2)
Total Protein: 6 g/dL — ABNORMAL LOW (ref 6.5–8.1)

## 2019-07-17 LAB — BLOOD CULTURE ID PANEL (REFLEXED)

## 2019-07-17 LAB — CBC
HCT: 35.6 % — ABNORMAL LOW (ref 36.0–46.0)
Hemoglobin: 11.8 g/dL — ABNORMAL LOW (ref 12.0–15.0)
MCH: 30.4 pg (ref 26.0–34.0)
MCHC: 33.1 g/dL (ref 30.0–36.0)
MCV: 91.8 fL (ref 80.0–100.0)
Platelets: DECREASED 10*3/uL (ref 150–400)
RBC: 3.88 MIL/uL (ref 3.87–5.11)
RDW: 11.8 % (ref 11.5–15.5)
WBC: 4.1 10*3/uL (ref 4.0–10.5)
nRBC: 0 % (ref 0.0–0.2)

## 2019-07-17 LAB — D-DIMER, QUANTITATIVE: D-Dimer, Quant: 1.32 ug/mL-FEU — ABNORMAL HIGH (ref 0.00–0.50)

## 2019-07-17 LAB — MAGNESIUM: Magnesium: 1.8 mg/dL (ref 1.7–2.4)

## 2019-07-17 LAB — C-REACTIVE PROTEIN: CRP: 10.5 mg/dL — ABNORMAL HIGH (ref <1.0)

## 2019-07-17 MED ORDER — SODIUM CHLORIDE 0.9 % IV SOLN
2.0000 g | INTRAVENOUS | Status: DC
  Administered 2019-07-17 – 2019-07-19 (×3): 2 g via INTRAVENOUS
  Filled 2019-07-17 (×2): qty 20
  Filled 2019-07-17: qty 2

## 2019-07-17 NOTE — Progress Notes (Signed)
PROGRESS NOTE  Leslie Chandler VQQ:[id number] DOB: [birth year] DOA: 07/15/2019 PCP: Mindi Curling, PA-C   LOS: 2 days   Brief Narrative / Interim history: 84 year old female with intellectual disability, severe cognitive impairment, chronic diastolic CHF, seizure disorder, asthma, hypertension, hyperlipidemia, dysphagia, chronically wheelchair-bound came into the hospital with cough, fever at home and increased work of breathing.  Chest x-ray showed multifocal pneumonia and she tested positive for COVID-19.  She required 2 L of oxygen and was admitted to the hospital  Subjective / 24h Interval events: Patient alert this morning, denies any complaints, denies any shortness of breath.  Assessment & Plan: Principal Problem:   Acute respiratory disease due to COVID-19 virus Active Problems:   Hypertension   Asthma   Chronic diastolic heart failure (HCC)   Seizure disorder (HCC)   Hypothyroidism   Pneumonia due to COVID-19 virus   Principal Problem Acute Hypoxic Respiratory Failure due to Covid-19 Viral Illness -Patient was admitted to the hospital with COVID-19 pneumonia, and hypoxic respiratory failure -She was placed on Decadron as well as remdesivir, continue -CRP overall upward trend but clinically she is improving. -Continue 3 L nasal cannula, wean off as tolerated  COVID-19 Labs  Recent Labs    07/15/19 2047 07/15/19 2048 07/16/19 0505 07/17/19 0404  DDIMER 1.58* 1.86* 1.10* 1.32*  FERRITIN 236  --  280  --   LDH 323*  --   --   --   CRP 8.2*  --  9.7* 10.5*    No results found for: SARSCOV2NAA  Active Problems Gram-negative bacteremia -Patient's blood cultures showed gram-negative rods 1/4 bottles, rarely contaminant, will monitor speciation and sensitivities -She is afebrile -Started on ceftriaxone  Chronic diastolic CHF -Appears euvolemic, continue Coreg  Seizure disorder -Continue Keppra, keep IV until has good p.o. intake  Hypertension -Continue Coreg,  lisinopril/HCTZ  Hypothyroidism -Continue Synthroid  Dysphagia -Currently on dysphagia 2 diet  Intellectual disability with severe cognition impairment -Wheelchair-bound, stable   Scheduled Meds: . carvedilol  3.125 mg Oral BID WC  . dexamethasone (DECADRON) injection  6 mg Intravenous Q24H  . enoxaparin (LOVENOX) injection  40 mg Subcutaneous Q24H  . feeding supplement (ENSURE ENLIVE)  237 mL Oral BID BM  . hydrochlorothiazide  12.5 mg Oral Daily  . Ipratropium-Albuterol  1 puff Inhalation Q6H  . levothyroxine  88 mcg Oral Q1200  . lisinopril  10 mg Oral Daily  . montelukast  10 mg Oral BH-q7a  . vitamin C  500 mg Oral Daily  . zinc sulfate  220 mg Oral Daily   Continuous Infusions: . sodium chloride Stopped (07/16/19 1744)  . cefTRIAXone (ROCEPHIN)  IV    . levETIRAcetam Stopped (07/17/19 0051)  . remdesivir 100 mg in NS 250 mL 100 mg (07/17/19 0947)   PRN Meds:.sodium chloride, acetaminophen, albuterol, chlorpheniramine-HYDROcodone, guaiFENesin-dextromethorphan, ondansetron **OR** ondansetron (ZOFRAN) IV  DVT prophylaxis: Lovenox Code Status: DNR Family Communication: d/w sister [name] [phone] Disposition Plan: home when ready   Consultants:  None   Procedures:  None   Microbiology: Blood cultures 1/4 bottles GNR  Antimicrobials: Ceftriaxone 11/29 >>  Objective: Vitals:   07/17/19 0400 07/17/19 0439 07/17/19 0800 07/17/19 1000  BP: 140/77  121/65   Pulse: 78  62   Resp: 20  18   Temp: 98.3 F (36.8 C)  (!) 97.5 F (36.4 C)   TempSrc: Oral  Oral   SpO2: 90%  92% 94%  Weight:  69.6 kg    Height:  Intake/Output Summary (Last 24 hours) at 07/17/2019 1137 Last data filed at 07/17/2019 0947 Gross per 24 hour  Intake 1076.81 ml  Output 650 ml  Net 426.81 ml   Filed Weights   07/16/19 1819 07/17/19 0439  Weight: 72.4 kg 69.6 kg    Examination:  Constitutional: NAD Eyes: no scleral icterus ENMT: Mucous membranes are moist.   Neck: normal, supple Respiratory: diminished at the bases, no wheezing, no crackles  Cardiovascular: Regular rate and rhythm, no murmurs / rubs / gallops. No LE edema.  Abdomen: non distended, no tenderness. Bowel sounds positive.  Musculoskeletal: no clubbing / cyanosis.  Skin: no rashes Neurologic: non focal   Data Reviewed: I have independently reviewed following labs and imaging studies   CBC: Recent Labs  Lab 07/15/19 1854 07/16/19 0505 07/17/19 0404  WBC 5.8 4.0 4.1  NEUTROABS 4.0 2.5  --   HGB 12.0 11.0* 11.8*  HCT 36.2 33.1* 35.6*  MCV 95.3 94.3 91.8  PLT PLATELET CLUMPS NOTED ON SMEAR, COUNT APPEARS DECREASED PLATELET CLUMPS NOTED ON SMEAR, COUNT APPEARS DECREASED PLATELET CLUMPS NOTED ON SMEAR, COUNT APPEARS DECREASED   Basic Metabolic Panel: Recent Labs  Lab 07/15/19 2047 07/16/19 0505 07/17/19 0404  NA 133* 136 134*  K 4.0 3.5 3.5  CL 98 98 100  CO2 22 27 23   GLUCOSE 96 104* 134*  BUN 20 20 25*  CREATININE 0.87 0.97 0.67  CALCIUM 7.5* 7.7* 7.7*  MG  --  1.7 1.8  PHOS  --  2.7  --    GFR: Estimated Creatinine Clearance: 50.8 mL/min (by C-G formula based on SCr of 0.67 mg/dL). Liver Function Tests: Recent Labs  Lab 07/15/19 2047 07/16/19 0505 07/17/19 0404  AST 36 25 29  ALT 16 15 18   ALKPHOS 56 60 60  BILITOT 1.2 0.6 0.7  PROT 5.5* 5.9* 6.0*  ALBUMIN 2.7* 2.6* 2.9*   No results for input(s): LIPASE, AMYLASE in the last 168 hours. No results for input(s): AMMONIA in the last 168 hours. Coagulation Profile: No results for input(s): INR, PROTIME in the last 168 hours. Cardiac Enzymes: No results for input(s): CKTOTAL, CKMB, CKMBINDEX, TROPONINI in the last 168 hours. BNP (last 3 results) No results for input(s): PROBNP in the last 8760 hours. HbA1C: No results for input(s): HGBA1C in the last 72 hours. CBG: No results for input(s): GLUCAP in the last 168 hours. Lipid Profile: Recent Labs    07/15/19 1854  TRIG 110   Thyroid Function  Tests: Recent Labs    07/15/19 2104  TSH 1.020   Anemia Panel: Recent Labs    07/15/19 2047 07/16/19 0505  FERRITIN 236 280   Urine analysis:    Component Value Date/Time   COLORURINE YELLOW 11/04/2018 1012   APPEARANCEUR CLEAR 11/04/2018 1012   LABSPEC 1.013 11/04/2018 1012   PHURINE 6.0 11/04/2018 1012   GLUCOSEU NEGATIVE 11/04/2018 1012   HGBUR NEGATIVE 11/04/2018 1012   BILIRUBINUR NEGATIVE 11/04/2018 1012   KETONESUR NEGATIVE 11/04/2018 1012   PROTEINUR NEGATIVE 11/04/2018 1012   NITRITE NEGATIVE 11/04/2018 1012   LEUKOCYTESUR NEGATIVE 11/04/2018 1012   Sepsis Labs: Invalid input(s): PROCALCITONIN, LACTICIDVEN  Recent Results (from the past 240 hour(s))  Blood Culture (routine x 2)     Status: None (Preliminary result)   Collection Time: 07/15/19  6:50 PM   Specimen: BLOOD  Result Value Ref Range Status   Specimen Description BLOOD LEFT ANTECUBITAL  Final   Special Requests   Final    BOTTLES DRAWN  AEROBIC AND ANAEROBIC Blood Culture adequate volume   Culture   Final    NO GROWTH 2 DAYS Performed at [facility], [address] [place], [place] [address]    Report Status PENDING  Incomplete  Blood Culture (routine x 2)     Status: None (Preliminary result)   Collection Time: 07/15/19  6:54 PM   Specimen: BLOOD LEFT HAND  Result Value Ref Range Status   Specimen Description BLOOD LEFT HAND  Final   Special Requests   Final    BOTTLES DRAWN AEROBIC AND ANAEROBIC Blood Culture adequate volume Performed at [facility], [address] [place], [place] [address]    Culture  Setup Time   Final    GRAM NEGATIVE RODS CORRECTED RESULTS AEROBIC BOTTLE ONLY PREVIOUSLY REPORTED AS: AEROCOCCUS SPECIES CORRECTED RESULTS CALLED TO: PHARMD JOHNSTON, N [id number] FCP    Culture GRAM NEGATIVE RODS  Final   Report Status PENDING  Incomplete  Blood Culture ID Panel (Reflexed)     Status: None   Collection Time: 07/15/19  6:54 PM  Result Value Ref Range  Status   Enterococcus species NOT DETECTED NOT DETECTED Final    Comment: CRITICAL RESULT CALLED TO, READ BACK BY AND VERIFIED WITH: PHARMD JOHNSTON, N [phone] FCP    Listeria monocytogenes NOT DETECTED NOT DETECTED Final   Staphylococcus species NOT DETECTED NOT DETECTED Final   Staphylococcus aureus (BCID) NOT DETECTED NOT DETECTED Final   Streptococcus species NOT DETECTED NOT DETECTED Final   Streptococcus agalactiae NOT DETECTED NOT DETECTED Final   Streptococcus pneumoniae NOT DETECTED NOT DETECTED Final   Streptococcus pyogenes NOT DETECTED NOT DETECTED Final   Acinetobacter baumannii NOT DETECTED NOT DETECTED Final   Enterobacteriaceae species NOT DETECTED NOT DETECTED Final   Enterobacter cloacae complex NOT DETECTED NOT DETECTED Final   Escherichia coli NOT DETECTED NOT DETECTED Final   Klebsiella oxytoca NOT DETECTED NOT DETECTED Final   Klebsiella pneumoniae NOT DETECTED NOT DETECTED Final   Proteus species NOT DETECTED NOT DETECTED Final   Serratia marcescens NOT DETECTED NOT DETECTED Final   Haemophilus influenzae NOT DETECTED NOT DETECTED Final   Neisseria meningitidis NOT DETECTED NOT DETECTED Final   Pseudomonas aeruginosa NOT DETECTED NOT DETECTED Final   Candida albicans NOT DETECTED NOT DETECTED Final   Candida glabrata NOT DETECTED NOT DETECTED Final   Candida krusei NOT DETECTED NOT DETECTED Final   Candida parapsilosis NOT DETECTED NOT DETECTED Final   Candida tropicalis NOT DETECTED NOT DETECTED Final    Comment: Performed at [facility], [address], [place], [place] [address]      Radiology Studies: Dg Chest Port 1 View  Result Date: 07/15/2019 CLINICAL DATA:  Shortness of breath. EXAM: PORTABLE CHEST 1 VIEW COMPARISON:  November 04, 2018. FINDINGS: Stable cardiomegaly. No pneumothorax is noted. Interval development of patchy bilateral airspace opacities are noted concerning for multifocal pneumonia. No significant pleural effusion is noted.  Bony thorax is unremarkable. IMPRESSION: Interval development of patchy bilateral airspace opacities are noted concerning for multifocal pneumonia. Followup radiographs are recommended until resolution. No significant pleural effusion is noted. Electronically Signed   By: Lupita Raider M.D.   On: 07/15/2019 19:21     Pamella Pert, MD, PhD [facility]  Contact via  [email or web address]  [facility] Office Info P: [phone] F: [phone]

## 2019-07-17 NOTE — Progress Notes (Addendum)
0800: Assumed care of Pt from night RN. Pt alert, DD, denies pain. VSS, SpO2 95% on 3 L [place], will titrate down if possible today. Purewick in place, repositioned. Pt seems withdrawn and afraid, emotional support given and  instructed on how to use call bell and to call for assistance.   58: Family updated by RN and was able to speak to Pt on phone. All questions answered  1200: Pt comfortable, listening to music. Will monitor  1530: Reassessed, VSS, denies pain, denies needs at this time. Will monitor  1840: Pt resting comfortably in bed, denies needs at this time

## 2019-07-17 NOTE — Progress Notes (Signed)
PHARMACY - PHYSICIAN COMMUNICATION CRITICAL VALUE ALERT - BLOOD CULTURE IDENTIFICATION (BCID)  Leslie Chandler is an 84 y.o. female who presented to [facility] on 07/15/2019 with a chief complaint of COVID-19 pneumonia   Assessment: WBC is wnl. Afebrile. 1/4 BCx with growth initially reported as aerococcus species now corrected to gram negative rods. BCID unable to identify organism.   Name of physician (or Provider) Contacted: Dr. Cruzita Lederer   Current antibiotics: None   Changes to prescribed antibiotics recommended: Ceftriaxone 2 gm IV Q 24 hours  Recommendations accepted by provider  Results for orders placed or performed during the hospital encounter of 07/15/19  Blood Culture ID Panel (Reflexed) (Collected: 07/15/2019  6:54 PM)  Result Value Ref Range   Enterococcus species NOT DETECTED NOT DETECTED   Listeria monocytogenes NOT DETECTED NOT DETECTED   Staphylococcus species NOT DETECTED NOT DETECTED   Staphylococcus aureus (BCID) NOT DETECTED NOT DETECTED   Streptococcus species NOT DETECTED NOT DETECTED   Streptococcus agalactiae NOT DETECTED NOT DETECTED   Streptococcus pneumoniae NOT DETECTED NOT DETECTED   Streptococcus pyogenes NOT DETECTED NOT DETECTED   Acinetobacter baumannii NOT DETECTED NOT DETECTED   Enterobacteriaceae species NOT DETECTED NOT DETECTED   Enterobacter cloacae complex NOT DETECTED NOT DETECTED   Escherichia coli NOT DETECTED NOT DETECTED   Klebsiella oxytoca NOT DETECTED NOT DETECTED   Klebsiella pneumoniae NOT DETECTED NOT DETECTED   Proteus species NOT DETECTED NOT DETECTED   Serratia marcescens NOT DETECTED NOT DETECTED   Haemophilus influenzae NOT DETECTED NOT DETECTED   Neisseria meningitidis NOT DETECTED NOT DETECTED   Pseudomonas aeruginosa NOT DETECTED NOT DETECTED   Candida albicans NOT DETECTED NOT DETECTED   Candida glabrata NOT DETECTED NOT DETECTED   Candida krusei NOT DETECTED NOT DETECTED   Candida parapsilosis NOT DETECTED NOT DETECTED   Candida tropicalis NOT DETECTED NOT DETECTED    Albertina Parr, PharmD., BCPS Clinical Pharmacist

## 2019-07-18 LAB — TYPE AND SCREEN
ABO/RH(D): A POS
Antibody Screen: NEGATIVE

## 2019-07-18 LAB — ABO/RH: ABO/RH(D): A POS

## 2019-07-18 MED ORDER — METHYLPREDNISOLONE SODIUM SUCC 40 MG IJ SOLR
40.0000 mg | Freq: Two times a day (BID) | INTRAMUSCULAR | Status: DC
  Administered 2019-07-18 – 2019-07-20 (×5): 40 mg via INTRAVENOUS
  Filled 2019-07-18 (×5): qty 1

## 2019-07-18 NOTE — Evaluation (Signed)
Physical Therapy Evaluation Patient Details Name: Leslie Chandler MRN: [id number] DOB: 1934-12-12 Today's Date: 07/18/2019   History of Present Illness  84 y.o. female admitted on 07/15/19 for cough, fever, increased WOB and COVID exposure, resulting in her own COVID (+) test with acute hypoxic respiratory failure due to viral PNA, gram negative bacteremia.  Pt with significant PMH of chronic diastolic CHF, seizure d/o, HTN, dysphagia, intellectual disability with severe cognitive impairment.   Clinical Impression  Leslie Chandler was able to assist with OOB to chair mobility today with DOE 2/4, O2 sats 88% at lowest on 2 L O2 [place].  Per sister, Leslie Chandler (goes by June) she is planning on having her go back to her apartment with caregivers like before.  This is good as Leslie Chandler was very clear that she did not want a nursing home during our session.    PT to follow acutely for deficits listed below.      Follow Up Recommendations Home health PT;Supervision/Assistance - 24 hour    Equipment Recommendations  None recommended by PT    Recommendations for Other Services    NA    Precautions / Restrictions Precautions Precautions: Fall;Other (comment) Precaution Comments: monitor O2 sats, no O2 at baseline      Mobility  Bed Mobility Overal bed mobility: Needs Assistance Bed Mobility: Supine to Sit     Supine to sit: Mod assist;HOB elevated;+2 for safety/equipment     General bed mobility comments: Two person mod assist to support trunk, swing legs over EOB and weight shift hips to get far enough out for feet to touch.    Transfers Overall transfer level: Needs assistance Equipment used: 2 person hand held assist Transfers: Sit to/from [employer] Sit to Stand: Mod assist;+2 physical assistance Stand pivot transfers: +2 physical assistance;Mod assist       General transfer comment: Two person mod assist to stand, pt able to grasp with bil arms to assist in stabilizing trunk, take good  weight on her legs, but unable to take pivotal steps, so therapists turned her to the chair to sit.    Ambulation/Gait             General Gait Details: non-ambulatory at baselin  Stairs                   Balance Overall balance assessment: Needs assistance Sitting-balance support: Feet supported;Bilateral upper extremity supported Sitting balance-Leahy Scale: Poor Sitting balance - Comments: posterior lean in sitting with tremorous trunk.  Postural control: Posterior lean Standing balance support: Bilateral upper extremity supported Standing balance-Leahy Scale: Poor Standing balance comment: two person mod assist to stand EOB.                              Pertinent Vitals/Pain Pain Assessment: No/denies pain    Home Living Family/patient expects to be discharged to:: Private residence Living Arrangements: Group Home Available Help at Discharge: Personal care attendant;Available 24 hours/day Type of Home: Apartment         Home Equipment: Wheelchair - manual      Prior Function Level of Independence: Needs assistance   Gait / Transfers Assistance Needed: assist to transfer to her [facility]             Extremity/Trunk Assessment   Upper Extremity Assessment Upper Extremity Assessment: Defer to OT evaluation(clubbed hands, can hook on bed rail or around trunk of helpe)    Lower Extremity Assessment  Lower Extremity Assessment: Generalized weakness(L knee increased valgus compared to R)    Cervical / Trunk Assessment Cervical / Trunk Assessment: Kyphotic(tremorous)  Communication   Communication: Other (comment)(does speak in phrases, gruff voice quality,)  Cognition Arousal/Alertness: Awake/alert Behavior During Therapy: Flat affect Overall Cognitive Status: History of cognitive impairments - at baseline                                 General Comments: Seems to be close to described baseline.       General Comments  General comments (skin integrity, edema, etc.): Increased DOE with mobility, lowest O2 sat 88% on 2 L O2 [place] as measured by nellcor finger probe.        Assessment/Plan    PT Assessment Patient needs continued PT services  PT Problem List Decreased strength;Decreased activity tolerance;Decreased balance;Decreased mobility;Decreased cognition;Decreased knowledge of use of DME;Decreased knowledge of precautions;Decreased safety awareness;Cardiopulmonary status limiting activity       PT Treatment Interventions Functional mobility training;Therapeutic activities;Therapeutic exercise;Balance training;Cognitive remediation;Patient/family education    PT Goals (Current goals can be found in the Care Plan section)  Acute Rehab PT Goals Patient Stated Goal: to go home to her apartment, not to a nursing home. PT Goal Formulation: With patient/family Time For Goal Achievement: 08/01/19 Potential to Achieve Goals: Good    Frequency Min 3X/week   Barriers to discharge           AM-PAC PT "6 Clicks" Mobility  Outcome Measure Help needed turning from your back to your side while in a flat bed without using bedrails?: A Lot Help needed moving from lying on your back to sitting on the side of a flat bed without using bedrails?: A Lot Help needed moving to and from a bed to a chair (including a wheelchair)?: A Lot Help needed standing up from a chair using your arms (e.g., wheelchair or bedside chair)?: A Lot Help needed to walk in hospital room?: Total Help needed climbing 3-5 steps with a railing? : Total 6 Click Score: 10    End of Session Equipment Utilized During Treatment: Gait belt Activity Tolerance: Patient tolerated treatment well Patient left: in chair;with call bell/phone within reach;with chair alarm set;with nursing/sitter in room Nurse Communication: Mobility status PT Visit Diagnosis: Muscle weakness (generalized) (M62.81);Other abnormalities of gait and mobility (R26.89)     Time: 1610-9604 PT Time Calculation (min) (ACUTE ONLY): 34 min   Charges:      Verdene Lennert, PT, DPT  Acute Rehabilitation [phone] pager #[phone] office  @ [facility]: [phone]   PT Evaluation $PT Eval Moderate Complexity: 1 Mod PT Treatments $Therapeutic Activity: 8-22 mins       07/18/2019, 4:50 PM

## 2019-07-18 NOTE — Progress Notes (Signed)
PROGRESS NOTE  Leslie Chandler JME:[id number] DOB: July 13, 1935 DOA: 07/15/2019 PCP: Leslie Curling, PA-C   LOS: 3 days   Brief Narrative / Interim history: 84 year old female with intellectual disability, severe cognitive impairment, chronic diastolic CHF, seizure disorder, asthma, hypertension, hyperlipidemia, dysphagia, chronically wheelchair-bound came into the hospital with cough, fever at home and increased work of breathing.  Chest x-ray showed multifocal pneumonia and she tested positive for COVID-19.  She required 2 L of oxygen and was admitted to the hospital  Subjective / 24h Interval events: No complaints, denies any shortness of breath.  Minimally verbal at baseline  Assessment & Plan: Principal Problem:   Acute respiratory disease due to COVID-19 virus Active Problems:   Hypertension   Asthma   Chronic diastolic heart failure (HCC)   Seizure disorder (HCC)   Hypothyroidism   Pneumonia due to COVID-19 virus   Principal Problem Acute Hypoxic Respiratory Failure due to Covid-19 Viral Illness -Patient was admitted to the hospital with COVID-19 pneumonia, and hypoxic respiratory failure -She was placed on Decadron as well as remdesivir, continue -Clinically improving -Continue 3 L nasal cannula, wean off as tolerated  COVID-19 Labs  Recent Labs    07/15/19 2047 07/15/19 2048 07/16/19 0505 07/17/19 0404  DDIMER 1.58* 1.86* 1.10* 1.32*  FERRITIN 236  --  280  --   LDH 323*  --   --   --   CRP 8.2*  --  9.7* 10.5*    No results found for: SARSCOV2NAA  Active Problems Gram-negative bacteremia -Patient's blood cultures showed gram-negative rods 1/4 bottles, speciation today is [employer], discussed with Dr. Graylon Good with ID and is likely to be a contaminant -She is afebrile -Started on ceftriaxone, continue  Chronic diastolic CHF -Appears euvolemic, continue Coreg  Seizure disorder -Continue Keppra  Hypertension -Continue Coreg, lisinopril/HCTZ   Hypothyroidism -Continue Synthroid  Dysphagia -Currently on dysphagia 2 diet  Intellectual disability with severe cognition impairment -Wheelchair-bound, stable   Scheduled Meds: . carvedilol  3.125 mg Oral BID WC  . dexamethasone (DECADRON) injection  6 mg Intravenous Q24H  . enoxaparin (LOVENOX) injection  40 mg Subcutaneous Q24H  . feeding supplement (ENSURE ENLIVE)  237 mL Oral BID BM  . hydrochlorothiazide  12.5 mg Oral Daily  . Ipratropium-Albuterol  1 puff Inhalation Q6H  . levothyroxine  88 mcg Oral Q1200  . lisinopril  10 mg Oral Daily  . montelukast  10 mg Oral BH-q7a  . vitamin C  500 mg Oral Daily  . zinc sulfate  220 mg Oral Daily   Continuous Infusions: . sodium chloride Stopped (07/16/19 1744)  . cefTRIAXone (ROCEPHIN)  IV 2 g (07/18/19 1148)  . levETIRAcetam 250 mg (07/18/19 1217)  . remdesivir 100 mg in NS 250 mL 100 mg (07/18/19 0959)   PRN Meds:.sodium chloride, acetaminophen, albuterol, chlorpheniramine-HYDROcodone, guaiFENesin-dextromethorphan, ondansetron **OR** ondansetron (ZOFRAN) IV  DVT prophylaxis: Lovenox Code Status: DNR Family Communication: d/w sister Leslie Chandler [phone] Disposition Plan: home when ready   Consultants:  None   Procedures:  None   Microbiology: Blood cultures 1/4 bottles GNR  Antimicrobials: Ceftriaxone 11/29 >>  Objective: Vitals:   07/18/19 0415 07/18/19 0856 07/18/19 1200 07/18/19 1210  BP: 135/67 132/70 112/88   Pulse: 69 68 65 62  Resp: 15 14 16 15   Temp: 98 F (36.7 C) 97.9 F (36.6 C)    TempSrc: Oral Oral    SpO2: 94% 92% (!) 87% 90%  Weight: 72 kg     Height:  Intake/Output Summary (Last 24 hours) at 07/18/2019 1423 Last data filed at 07/18/2019 1000 Gross per 24 hour  Intake 587 ml  Output 800 ml  Net -213 ml   Filed Weights   07/16/19 1819 07/17/19 0439 07/18/19 0415  Weight: 72.4 kg 69.6 kg 72 kg    Examination:  Constitutional: No distress Eyes: No scleral icterus  ENMT: Moist mucous membranes.  Neck: normal, supple Respiratory: Diminished at the bases, no wheezing, no crackles Cardiovascular: Regular rate and rhythm, no murmurs.  No edema Abdomen: Soft, ND, NT, bowel sounds positive Musculoskeletal: no clubbing / cyanosis.  Skin: No rashes seen Neurologic: non focal   Data Reviewed: I have independently reviewed following labs and imaging studies   CBC: Recent Labs  Lab 07/15/19 1854 07/16/19 0505 07/17/19 0404  WBC 5.8 4.0 4.1  NEUTROABS 4.0 2.5  --   HGB 12.0 11.0* 11.8*  HCT 36.2 33.1* 35.6*  MCV 95.3 94.3 91.8  PLT PLATELET CLUMPS NOTED ON SMEAR, COUNT APPEARS DECREASED PLATELET CLUMPS NOTED ON SMEAR, COUNT APPEARS DECREASED PLATELET CLUMPS NOTED ON SMEAR, COUNT APPEARS DECREASED   Basic Metabolic Panel: Recent Labs  Lab 07/15/19 2047 07/16/19 0505 07/17/19 0404  NA 133* 136 134*  K 4.0 3.5 3.5  CL 98 98 100  CO2 22 27 23   GLUCOSE 96 104* 134*  BUN 20 20 25*  CREATININE 0.87 0.97 0.67  CALCIUM 7.5* 7.7* 7.7*  MG  --  1.7 1.8  PHOS  --  2.7  --    GFR: Estimated Creatinine Clearance: 55.1 mL/min (by C-G formula based on SCr of 0.67 mg/dL). Liver Function Tests: Recent Labs  Lab 07/15/19 2047 07/16/19 0505 07/17/19 0404  AST 36 25 29  ALT 16 15 18   ALKPHOS 56 60 60  BILITOT 1.2 0.6 0.7  PROT 5.5* 5.9* 6.0*  ALBUMIN 2.7* 2.6* 2.9*   No results for input(s): LIPASE, AMYLASE in the last 168 hours. No results for input(s): AMMONIA in the last 168 hours. Coagulation Profile: No results for input(s): INR, PROTIME in the last 168 hours. Cardiac Enzymes: No results for input(s): CKTOTAL, CKMB, CKMBINDEX, TROPONINI in the last 168 hours. BNP (last 3 results) No results for input(s): PROBNP in the last 8760 hours. HbA1C: No results for input(s): HGBA1C in the last 72 hours. CBG: No results for input(s): GLUCAP in the last 168 hours. Lipid Profile: Recent Labs    07/15/19 1854  TRIG 110   Thyroid Function  Tests: Recent Labs    07/15/19 2104  TSH 1.020   Anemia Panel: Recent Labs    07/15/19 2047 07/16/19 0505  FERRITIN 236 280   Urine analysis:    Component Value Date/Time   COLORURINE YELLOW 11/04/2018 1012   APPEARANCEUR CLEAR 11/04/2018 1012   LABSPEC 1.013 11/04/2018 1012   PHURINE 6.0 11/04/2018 1012   GLUCOSEU NEGATIVE 11/04/2018 1012   HGBUR NEGATIVE 11/04/2018 1012   BILIRUBINUR NEGATIVE 11/04/2018 1012   KETONESUR NEGATIVE 11/04/2018 1012   PROTEINUR NEGATIVE 11/04/2018 1012   NITRITE NEGATIVE 11/04/2018 1012   LEUKOCYTESUR NEGATIVE 11/04/2018 1012   Sepsis Labs: Invalid input(s): PROCALCITONIN, LACTICIDVEN  Recent Results (from the past 240 hour(s))  Blood Culture (routine x 2)     Status: None (Preliminary result)   Collection Time: 07/15/19  6:50 PM   Specimen: BLOOD  Result Value Ref Range Status   Specimen Description BLOOD LEFT ANTECUBITAL  Final   Special Requests   Final    BOTTLES DRAWN AEROBIC AND ANAEROBIC  Blood Culture adequate volume   Culture   Final    NO GROWTH 3 DAYS Performed at [facility], [address] [place], [place] [address]    Report Status PENDING  Incomplete  Blood Culture (routine x 2)     Status: Abnormal (Preliminary result)   Collection Time: 07/15/19  6:54 PM   Specimen: BLOOD LEFT HAND  Result Value Ref Range Status   Specimen Description BLOOD LEFT HAND  Final   Special Requests   Final    BOTTLES DRAWN AEROBIC AND ANAEROBIC Blood Culture adequate volume   Culture  Setup Time   Final    GRAM NEGATIVE RODS CORRECTED RESULTS AEROBIC BOTTLE ONLY PREVIOUSLY REPORTED AS: AEROCOCCUS SPECIES CORRECTED RESULTS CALLED TO: PHARMD JOHNSTON, N [phone] FCP    Culture (A)  Final    PANTOEA AGGLOMERANS SUSCEPTIBILITIES TO FOLLOW Performed at [facility], [address] [place], [place] [address]    Report Status PENDING  Incomplete  Blood Culture ID Panel (Reflexed)     Status: None   Collection Time:  07/15/19  6:54 PM  Result Value Ref Range Status   Enterococcus species NOT DETECTED NOT DETECTED Final    Comment: CRITICAL RESULT CALLED TO, READ BACK BY AND VERIFIED WITH: PHARMD JOHNSTON, N [phone] FCP    Listeria monocytogenes NOT DETECTED NOT DETECTED Final   Staphylococcus species NOT DETECTED NOT DETECTED Final   Staphylococcus aureus (BCID) NOT DETECTED NOT DETECTED Final   Streptococcus species NOT DETECTED NOT DETECTED Final   Streptococcus agalactiae NOT DETECTED NOT DETECTED Final   Streptococcus pneumoniae NOT DETECTED NOT DETECTED Final   Streptococcus pyogenes NOT DETECTED NOT DETECTED Final   Acinetobacter baumannii NOT DETECTED NOT DETECTED Final   Enterobacteriaceae species NOT DETECTED NOT DETECTED Final   Enterobacter cloacae complex NOT DETECTED NOT DETECTED Final   Escherichia coli NOT DETECTED NOT DETECTED Final   Klebsiella oxytoca NOT DETECTED NOT DETECTED Final   Klebsiella pneumoniae NOT DETECTED NOT DETECTED Final   Proteus species NOT DETECTED NOT DETECTED Final   Serratia marcescens NOT DETECTED NOT DETECTED Final   Haemophilus influenzae NOT DETECTED NOT DETECTED Final   Neisseria meningitidis NOT DETECTED NOT DETECTED Final   Pseudomonas aeruginosa NOT DETECTED NOT DETECTED Final   Candida albicans NOT DETECTED NOT DETECTED Final   Candida glabrata NOT DETECTED NOT DETECTED Final   Candida krusei NOT DETECTED NOT DETECTED Final   Candida parapsilosis NOT DETECTED NOT DETECTED Final   Candida tropicalis NOT DETECTED NOT DETECTED Final    Comment: Performed at [facility], [address] [place], [place] [address]      Radiology Studies: No results found.   Pamella Pertostin , MD, PhD [facility]  Contact via  [email or web address]  [facility] Office Info P: [phone] F: [phone]

## 2019-07-18 NOTE — Evaluation (Signed)
Clinical/Bedside Swallow Evaluation Patient Details  Name: Leslie Chandler MRN: [id number] Date of Birth: 1935-01-06  Today's Date: 07/18/2019 Time: SLP Start Time (ACUTE ONLY): 0815 SLP Stop Time (ACUTE ONLY): 0826 SLP Time Calculation (min) (ACUTE ONLY): 11 min  Past Medical History:  Past Medical History:  Diagnosis Date  . Asthma   . Cognitive developmental delay   . Epilepsia ([facility])   . Hypertension   . Mental retardation   . Osteoporosis    Past Surgical History:  Past Surgical History:  Procedure Laterality Date  . BREAST RECONSTRUCTION    . DENTAL SURGERY    . REDUCTION MAMMAPLASTY Bilateral    HPI:  Pt is an 84 year old female admitted with multifocal PNA from COVID-19. Pt has had several MBS with most recent one 07/2015 showing penetration from large straw sips and mild vallecular residue that both cleared with an extra swallow. Dys 2 solids and thin liquids were recommended. Pt was briefly on nectar thick liquids in 2015. PMH also includes" Pt has had several MBS with most recent one 07/2015 showing penetration from large straw sips and mild vallecular residue that both cleared with an extra swallow. Dys 2 solids and thin liquids were recommended. Pt was briefly on nectar thick liquids in 2015. PMH also includes: intellectual disability, severe cognitive impairment, chronic diastolic CHF, seizure disorder, asthma, HTN, HLD, dysphagia, chronically wheelchair-bound   Assessment / Plan / Recommendation Clinical Impression  Pt's swallowing function appears to be consistent with previous evaluations. She has reduced labial seal and lingual manipulation with anterior tongue thrust. Ice chips are incompletely cleared from her oral cavity after mastication efforts but purees and thin liquids are cleared with no oral residue. Pt appears to have discoordination and takes large volumes at a time with cup sips but seems to have improved rate and swifter-appearing response with a straw. No  overt s/s of aspiration are noted although she does have two audible swallows with almost every bolus. In the past this has helped to clear mild pharyngeal residue. Recommend continuing her pureed diet and thin liquids but SLP f/u for safety would be warranted in light of acute illness.   SLP Visit Diagnosis: Dysphagia, oropharyngeal phase (R13.12)    Aspiration Risk  Mild aspiration risk    Diet Recommendation Dysphagia 1 (Puree);Thin liquid   Liquid Administration via: Straw Medication Administration: Crushed with puree Supervision: Staff to assist with self feeding;Full supervision/cueing for compensatory strategies Compensations: Minimize environmental distractions;Slow rate;Small sips/bites Postural Changes: Seated upright at 90 degrees;Remain upright for at least 30 minutes after po intake    Other  Recommendations Oral Care Recommendations: Oral care BID   Follow up Recommendations 24 hour supervision/assistance      Frequency and Duration min 2x/week  2 weeks       Prognosis Prognosis for Safe Diet Advancement: Good Barriers to Reach Goals: Cognitive deficits      Swallow Study   General HPI: Pt is an 84 year old female admitted with multifocal PNA from COVID-19. Pt has had several MBS with most recent one 07/2015 showing penetration from large straw sips and mild vallecular residue that both cleared with an extra swallow. Dys 2 solids and thin liquids were recommended. Pt was briefly on nectar thick liquids in 2015. PMH also includes" Pt has had several MBS with most recent one 07/2015 showing penetration from large straw sips and mild vallecular residue that both cleared with an extra swallow. Dys 2 solids and thin liquids were recommended. Pt was briefly on nectar  thick liquids in 2015. PMH also includes: intellectual disability, severe cognitive impairment, chronic diastolic CHF, seizure disorder, asthma, HTN, HLD, dysphagia, chronically wheelchair-bound Type of Study:  Bedside Swallow Evaluation Previous Swallow Assessment: see HPI Diet Prior to this Study: Dysphagia 1 (puree);Thin liquids Temperature Spikes Noted: No Respiratory Status: Nasal cannula History of Recent Intubation: No Behavior/Cognition: Alert;Cooperative;Requires cueing Oral Cavity Assessment: Within Functional Limits Oral Care Completed by SLP: No Oral Cavity - Dentition: Poor condition;Missing dentition Self-Feeding Abilities: Needs assist Patient Positioning: Upright in bed Baseline Vocal Quality: Other (comment)(rough) Volitional Swallow: Unable to elicit    Oral/Motor/Sensory Function Overall Oral Motor/Sensory Function: (difficulty following commands but appears functional)   Ice Chips Ice chips: Impaired Presentation: Spoon Oral Phase Impairments: Impaired mastication Oral Phase Functional Implications: Oral residue   Thin Liquid Thin Liquid: Impaired Presentation: Cup;Self Fed;Straw Oral Phase Functional Implications: Other (comment)(appearance of discoordination, piecemeal swallow') Pharyngeal  Phase Impairments: Multiple swallows;Other (comments)(audible swallow)    Nectar Thick Nectar Thick Liquid: Not tested   Honey Thick Honey Thick Liquid: Not tested   Puree Puree: Impaired Presentation: Spoon Oral Phase Functional Implications: Prolonged oral transit Pharyngeal Phase Impairments: Multiple swallows   Solid     Solid: Not tested      Virl Axe Maley Venezia 07/18/2019,8:30 AM  Ivar Drape, M.A. CCC-SLP Acute Herbalist [phone] Office [phone]

## 2019-07-18 NOTE — Progress Notes (Signed)
RN called and updated patient's sister [name] at number listed in chart, all questions and concerns answered at this time.

## 2019-07-19 LAB — CBC
HCT: 33.4 % — ABNORMAL LOW (ref 36.0–46.0)
Hemoglobin: 11.4 g/dL — ABNORMAL LOW (ref 12.0–15.0)
MCH: 31.5 pg (ref 26.0–34.0)
MCHC: 34.1 g/dL (ref 30.0–36.0)
MCV: 92.3 fL (ref 80.0–100.0)
Platelets: 122 10*3/uL — ABNORMAL LOW (ref 150–400)
RBC: 3.62 MIL/uL — ABNORMAL LOW (ref 3.87–5.11)
RDW: 11.9 % (ref 11.5–15.5)
WBC: 9.4 10*3/uL (ref 4.0–10.5)
nRBC: 0 % (ref 0.0–0.2)

## 2019-07-19 LAB — COMPREHENSIVE METABOLIC PANEL
ALT: 17 U/L (ref 0–44)
AST: 21 U/L (ref 15–41)
Albumin: 2.7 g/dL — ABNORMAL LOW (ref 3.5–5.0)
Alkaline Phosphatase: 54 U/L (ref 38–126)
Anion gap: 11 (ref 5–15)
BUN: 41 mg/dL — ABNORMAL HIGH (ref 8–23)
CO2: 26 mmol/L (ref 22–32)
Calcium: 8.5 mg/dL — ABNORMAL LOW (ref 8.9–10.3)
Chloride: 103 mmol/L (ref 98–111)
Creatinine, Ser: 0.93 mg/dL (ref 0.44–1.00)
GFR calc Af Amer: >60 mL/min (ref >60)
GFR calc non Af Amer: 57 mL/min — ABNORMAL LOW (ref >60)
Glucose, Bld: 187 mg/dL — ABNORMAL HIGH (ref 70–99)
Potassium: 3.8 mmol/L (ref 3.5–5.1)
Sodium: 140 mmol/L (ref 135–145)
Total Bilirubin: 0.2 mg/dL — ABNORMAL LOW (ref 0.3–1.2)
Total Protein: 5.8 g/dL — ABNORMAL LOW (ref 6.5–8.1)

## 2019-07-19 LAB — CULTURE, BLOOD (ROUTINE X 2): Special Requests: ADEQUATE

## 2019-07-19 LAB — C-REACTIVE PROTEIN: CRP: 4.5 mg/dL — ABNORMAL HIGH (ref <1.0)

## 2019-07-19 LAB — D-DIMER, QUANTITATIVE: D-Dimer, Quant: 0.86 ug/mL-FEU — ABNORMAL HIGH (ref 0.00–0.50)

## 2019-07-19 MED ORDER — FUROSEMIDE 10 MG/ML IJ SOLN
20.0000 mg | Freq: Once | INTRAMUSCULAR | Status: AC
  Administered 2019-07-19: 20 mg via INTRAVENOUS
  Filled 2019-07-19: qty 2

## 2019-07-19 MED ORDER — LEVETIRACETAM 100 MG/ML PO SOLN
250.0000 mg | Freq: Two times a day (BID) | ORAL | Status: DC
  Administered 2019-07-19 – 2019-07-20 (×3): 250 mg via ORAL
  Filled 2019-07-19 (×4): qty 2.5

## 2019-07-19 NOTE — Progress Notes (Signed)
  Speech Language Pathology Treatment: Dysphagia  Patient Details Name: Leslie Chandler MRN: [id number] DOB: [birth year] Today's Date: 07/19/2019 Time: 1200-1207 SLP Time Calculation (min) (ACUTE ONLY): 7 min  Assessment / Plan / Recommendation Clinical Impression  Pt observed with sips of water; allowed straw sips without external control of rate. Pt took large sips and held briefly with two swallows to fully clear bolus. There did seem to be poor coordination of breathing and swallowing and pt remains easily short of breath. Pt may continue current diet as there are not signs of aspiration, but SLP will follow for tolerance as status is tenuous.   HPI HPI: Pt is an 84 year old female admitted with multifocal PNA from COVID-19. Pt has had several MBS with most recent one 07/2015 showing penetration from large straw sips and mild vallecular residue that both cleared with an extra swallow. Dys 2 solids and thin liquids were recommended. Pt was briefly on nectar thick liquids in 2015. PMH also includes" Pt has had several MBS with most recent one 07/2015 showing penetration from large straw sips and mild vallecular residue that both cleared with an extra swallow. Dys 2 solids and thin liquids were recommended. Pt was briefly on nectar thick liquids in 2015. PMH also includes: intellectual disability, severe cognitive impairment, chronic diastolic CHF, seizure disorder, asthma, HTN, HLD, dysphagia, chronically wheelchair-bound      SLP Plan  Continue with current plan of care       Recommendations  Diet recommendations: Dysphagia 1 (puree);Thin liquid Medication Administration: Crushed with puree Supervision: Staff to assist with self feeding;Full supervision/cueing for compensatory strategies Compensations: Minimize environmental distractions;Slow rate;Small sips/bites Postural Changes and/or Swallow Maneuvers: Seated upright 90 degrees                Follow up Recommendations: 24 hour  supervision/assistance SLP Visit Diagnosis: Dysphagia, oropharyngeal phase (R13.12) Plan: Continue with current plan of care       GO               Herbie Baltimore, MA [place] Pager [phone] Office [phone]  Lynann Beaver 07/19/2019, 12:51 PM

## 2019-07-19 NOTE — Social Work (Signed)
Per MD, pt stable for d/c. Spoke to [facility] [phone] who reports they are able to accept pt beginning tomorrow due to lack of adequate staff today. Will need Rx for decadron faxed to [phone] and sent with pt at d/c. Notified pt's sister/guardian, [name] of current plan. MD updated.   Wandra Feinstein, MSW, LCSW [phone] ([facility] coverage)

## 2019-07-19 NOTE — Progress Notes (Signed)
PROGRESS NOTE  Leslie Chandler ZOX:[id number] DOB: [birth year] DOA: 07/15/2019 PCP: Jordan Hawks, PA-C   LOS: 4 days   Brief Narrative / Interim history: 84 year old female with intellectual disability, severe cognitive impairment, chronic diastolic CHF, seizure disorder, asthma, hypertension, hyperlipidemia, dysphagia, chronically wheelchair-bound came into the hospital with cough, fever at home and increased work of breathing.  Chest x-ray showed multifocal pneumonia and she tested positive for COVID-19.  She required 2 L of oxygen and was admitted to the hospital.  She is improving with remdesivir and was able to be weaned off on 12/1.  Social worker checking with her group home whether she can return given Covid status  Subjective / 24h Interval events: Mainly nonverbal but answers basic questions, no complaints, appears comfortable  Assessment & Plan: Principal Problem:   Acute respiratory disease due to COVID-19 virus Active Problems:   Hypertension   Asthma   Chronic diastolic heart failure (HCC)   Seizure disorder (HCC)   Hypothyroidism   Pneumonia due to COVID-19 virus   Principal Problem Acute Hypoxic Respiratory Failure due to Covid-19 Viral Illness -Patient was admitted to the hospital with COVID-19 pneumonia, and hypoxic respiratory failure -She was placed on Decadron as well as remdesivir, continue, finishing remdesivir today 12/1 -Clinically improving -Still hypoxic this morning requiring 2 L nasal cannula, will give a dose of Lasix and see if we can wean off the oxygen today -Inflammatory markers improving  COVID-19 Labs  Recent Labs    07/17/19 0404 07/19/19 0225  DDIMER 1.32* 0.86*  CRP 10.5* 4.5*    No results found for: SARSCOV2NAA  Active Problems Gram-negative bacteremia -Patient's blood cultures showed gram-negative rods 1/4 bottles, speciation today is Pantoea Agglomerans, discussed with Dr. Ilsa Iha with ID and is likely to be a contaminant -She is  afebrile -Started on ceftriaxone, discontinue, today speciated final with Pantoea  Chronic diastolic CHF -Appears euvolemic, continue Coreg  Seizure disorder -Continue Keppra  Hypertension -Continue Coreg, lisinopril/HCTZ, blood pressure stable  Hypothyroidism -Continue Synthroid  Dysphagia -Currently on dysphagia 2 diet  Intellectual disability with severe cognition impairment -Wheelchair-bound, stable   Scheduled Meds: . carvedilol  3.125 mg Oral BID WC  . enoxaparin (LOVENOX) injection  40 mg Subcutaneous Q24H  . feeding supplement (ENSURE ENLIVE)  237 mL Oral BID BM  . hydrochlorothiazide  12.5 mg Oral Daily  . Ipratropium-Albuterol  1 puff Inhalation Q6H  . levETIRAcetam  250 mg Oral BID  . levothyroxine  88 mcg Oral Q1200  . lisinopril  10 mg Oral Daily  . methylPREDNISolone (SOLU-MEDROL) injection  40 mg Intravenous Q12H  . montelukast  10 mg Oral BH-q7a  . vitamin C  500 mg Oral Daily  . zinc sulfate  220 mg Oral Daily   Continuous Infusions: . sodium chloride Stopped (07/16/19 1744)  . cefTRIAXone (ROCEPHIN)  IV 2 g (07/19/19 1224)   PRN Meds:.sodium chloride, acetaminophen, albuterol, chlorpheniramine-HYDROcodone, guaiFENesin-dextromethorphan, ondansetron **OR** ondansetron (ZOFRAN) IV  DVT prophylaxis: Lovenox Code Status: DNR Family Communication: d/w sister (POA) Driscilla Moats [phone] Disposition Plan: home when ready   Consultants:  None   Procedures:  None   Microbiology: Blood cultures 1/4 bottles GNR  Antimicrobials: Ceftriaxone 11/29 >>  Objective: Vitals:   07/19/19 0325 07/19/19 0732 07/19/19 0800 07/19/19 1400  BP: (!) 142/57 110/82 128/63   Pulse: 72 69 66 62  Resp: Temp: 98.2 F (36.8 C) (!) 97 F (36.1 C)    TempSrc: Axillary Axillary    SpO2:  95% (!) 88% (!) 89% 91%  Weight: 53.8 kg     Height:        Intake/Output Summary (Last 24 hours) at 07/19/2019 1515 Last data filed at 07/19/2019 1403 Gross per  24 hour  Intake 480 ml  Output 1250 ml  Net -770 ml   Filed Weights   07/17/19 0439 07/18/19 0415 07/19/19 0325  Weight: 69.6 kg 72 kg 53.8 kg    Examination:  Constitutional: NAD Eyes: No icterus seen ENMT: Moist mucous membranes Neck: normal, supple Respiratory: Mostly clear, no wheezing, no crackles Cardiovascular: Regular rate and rhythm, no murmurs, no peripheral edema Abdomen: Soft, nontender, nondistended, positive bowel sounds Musculoskeletal: no clubbing / cyanosis.  Skin: No rashes seen Neurologic: non focal   Data Reviewed: I have independently reviewed following labs and imaging studies   CBC: Recent Labs  Lab 07/15/19 1854 07/16/19 0505 07/17/19 0404 07/19/19 0225  WBC 5.8 4.0 4.1 9.4  NEUTROABS 4.0 2.5  --   --   HGB 12.0 11.0* 11.8* 11.4*  HCT 36.2 33.1* 35.6* 33.4*  MCV 95.3 94.3 91.8 92.3  PLT PLATELET CLUMPS NOTED ON SMEAR, COUNT APPEARS DECREASED PLATELET CLUMPS NOTED ON SMEAR, COUNT APPEARS DECREASED PLATELET CLUMPS NOTED ON SMEAR, COUNT APPEARS DECREASED 122*   Basic Metabolic Panel: Recent Labs  Lab 07/15/19 2047 07/16/19 0505 07/17/19 0404 07/19/19 0225  NA 133* 136 134* 140  K 4.0 3.5 3.5 3.8  CL 98 98 100 103  CO2 22 27 23 26   GLUCOSE 96 104* 134* 187*  BUN 20 20 25* 41*  CREATININE 0.87 0.97 0.67 0.93  CALCIUM 7.5* 7.7* 7.7* 8.5*  MG  --  1.7 1.8  --   PHOS  --  2.7  --   --    GFR: Estimated Creatinine Clearance: 39.6 mL/min (by C-G formula based on SCr of 0.93 mg/dL). Liver Function Tests: Recent Labs  Lab 07/15/19 2047 07/16/19 0505 07/17/19 0404 07/19/19 0225  AST 36 25 29 21   ALT 16 15 18 17   ALKPHOS 56 60 60 54  BILITOT 1.2 0.6 0.7 0.2*  PROT 5.5* 5.9* 6.0* 5.8*  ALBUMIN 2.7* 2.6* 2.9* 2.7*   No results for input(s): LIPASE, AMYLASE in the last 168 hours. No results for input(s): AMMONIA in the last 168 hours. Coagulation Profile: No results for input(s): INR, PROTIME in the last 168 hours. Cardiac Enzymes:  No results for input(s): CKTOTAL, CKMB, CKMBINDEX, TROPONINI in the last 168 hours. BNP (last 3 results) No results for input(s): PROBNP in the last 8760 hours. HbA1C: No results for input(s): HGBA1C in the last 72 hours. CBG: No results for input(s): GLUCAP in the last 168 hours. Lipid Profile: No results for input(s): CHOL, HDL, LDLCALC, TRIG, CHOLHDL, LDLDIRECT in the last 72 hours. Thyroid Function Tests: No results for input(s): TSH, T4TOTAL, FREET4, T3FREE, THYROIDAB in the last 72 hours. Anemia Panel: No results for input(s): VITAMINB12, FOLATE, FERRITIN, TIBC, IRON, RETICCTPCT in the last 72 hours. Urine analysis:    Component Value Date/Time   COLORURINE YELLOW 11/04/2018 1012   APPEARANCEUR CLEAR 11/04/2018 1012   LABSPEC 1.013 11/04/2018 1012   PHURINE 6.0 11/04/2018 1012   GLUCOSEU NEGATIVE 11/04/2018 1012   HGBUR NEGATIVE 11/04/2018 1012   BILIRUBINUR NEGATIVE 11/04/2018 1012   KETONESUR NEGATIVE 11/04/2018 1012   PROTEINUR NEGATIVE 11/04/2018 1012   NITRITE NEGATIVE 11/04/2018 1012   LEUKOCYTESUR NEGATIVE 11/04/2018 1012   Sepsis Labs: Invalid input(s): PROCALCITONIN, LACTICIDVEN  Recent Results (from the past 240  hour(s))  Blood Culture (routine x 2)     Status: None (Preliminary result)   Collection Time: 07/15/19  6:50 PM   Specimen: BLOOD  Result Value Ref Range Status   Specimen Description BLOOD LEFT ANTECUBITAL  Final   Special Requests   Final    BOTTLES DRAWN AEROBIC AND ANAEROBIC Blood Culture adequate volume   Culture   Final    NO GROWTH 4 DAYS Performed at [facility], [address] [place], [place] [address]    Report Status PENDING  Incomplete  Blood Culture (routine x 2)     Status: Abnormal   Collection Time: 07/15/19  6:54 PM   Specimen: BLOOD LEFT HAND  Result Value Ref Range Status   Specimen Description BLOOD LEFT HAND  Final   Special Requests   Final    BOTTLES DRAWN AEROBIC AND ANAEROBIC Blood Culture adequate volume  Performed at [facility], [address] [place], [place] [address]    Culture  Setup Time   Final    GRAM NEGATIVE RODS CORRECTED RESULTS AEROBIC BOTTLE ONLY PREVIOUSLY REPORTED AS: AEROCOCCUS SPECIES CORRECTED RESULTS CALLED TO: PHARMD JOHNSTON, N [id number] FCP    Culture PANTOEA AGGLOMERANS (A)  Final   Report Status 07/19/2019 FINAL  Final   Organism ID, Bacteria PANTOEA AGGLOMERANS  Final      Susceptibility   Pantoea agglomerans - MIC*    CEFAZOLIN <=4 SENSITIVE Sensitive     CEFEPIME <=1 SENSITIVE Sensitive     CEFTAZIDIME <=1 SENSITIVE Sensitive     CEFTRIAXONE <=1 SENSITIVE Sensitive     CIPROFLOXACIN <=0.25 SENSITIVE Sensitive     GENTAMICIN <=1 SENSITIVE Sensitive     IMIPENEM <=0.25 SENSITIVE Sensitive     TRIMETH/SULFA <=20 SENSITIVE Sensitive     PIP/TAZO 64 INTERMEDIATE Intermediate     * PANTOEA AGGLOMERANS  Blood Culture ID Panel (Reflexed)     Status: None   Collection Time: 07/15/19  6:54 PM  Result Value Ref Range Status   Enterococcus species NOT DETECTED NOT DETECTED Final    Comment: CRITICAL RESULT CALLED TO, READ BACK BY AND VERIFIED WITH: PHARMD JOHNSTON, N [phone] FCP    Listeria monocytogenes NOT DETECTED NOT DETECTED Final   Staphylococcus species NOT DETECTED NOT DETECTED Final   Staphylococcus aureus (BCID) NOT DETECTED NOT DETECTED Final   Streptococcus species NOT DETECTED NOT DETECTED Final   Streptococcus agalactiae NOT DETECTED NOT DETECTED Final   Streptococcus pneumoniae NOT DETECTED NOT DETECTED Final   Streptococcus pyogenes NOT DETECTED NOT DETECTED Final   Acinetobacter baumannii NOT DETECTED NOT DETECTED Final   Enterobacteriaceae species NOT DETECTED NOT DETECTED Final   Enterobacter cloacae complex NOT DETECTED NOT DETECTED Final   Escherichia coli NOT DETECTED NOT DETECTED Final   Klebsiella oxytoca NOT DETECTED NOT DETECTED Final   Klebsiella pneumoniae NOT DETECTED NOT DETECTED Final   Proteus species NOT  DETECTED NOT DETECTED Final   Serratia marcescens NOT DETECTED NOT DETECTED Final   Haemophilus influenzae NOT DETECTED NOT DETECTED Final   Neisseria meningitidis NOT DETECTED NOT DETECTED Final   Pseudomonas aeruginosa NOT DETECTED NOT DETECTED Final   Candida albicans NOT DETECTED NOT DETECTED Final   Candida glabrata NOT DETECTED NOT DETECTED Final   Candida krusei NOT DETECTED NOT DETECTED Final   Candida parapsilosis NOT DETECTED NOT DETECTED Final   Candida tropicalis NOT DETECTED NOT DETECTED Final    Comment: Performed at [facility], [address], [place], [place] [address]      Radiology Studies: No results found.   Marzetta Board, MD, PhD [facility]  Contact via  [email or web address]  [place] P: [phone] F: [phone]

## 2019-07-19 NOTE — Progress Notes (Signed)
Occupational Therapy Evaluation Only Patient Details Name: Leslie Chandler MRN: [id number] DOB: Apr 22, 1935 Today's Date: 07/19/2019    History of Present Illness 84 y.o. female admitted on 07/15/19 for cough, fever, increased WOB and COVID exposure, resulting in her own COVID (+) test with acute hypoxic respiratory failure due to viral PNA, gram negative bacteremia.  Pt with significant PMH of chronic diastolic CHF, seizure d/o, HTN, dysphagia, intellectual disability with severe cognitive impairment.    Clinical Impression   PTA pt was residing at a group home, requiring 24/7 care for all self-care and functional transfer tasks. Pt is w/c bound and does not ambulate. Pt currently requires mod assist to total assist for self-care and functional transfer tasks. Pt does not use O2 at home, but is currently on 2L [place]. Pt tolerated sitting EOB 5 min with max assist to maintain balance. Pt's O2 SATs decreased to 86% while seated EOB, increasing back to 93% once semi-reclined in bed. 2/4 DOE. Pt noted to be soiled, requiring total assist to roll and get cleaned up. Pt appears to be functioning at or near baseline for self-care and functional transfer tasks. Skilled OT services not recommended at this time. OT will sign off.    Follow Up Recommendations  Other (comment)(Group home with 24/7 care)    Equipment Recommendations  None recommended by OT    Recommendations for Other Services       Precautions / Restrictions Precautions Precautions: Fall Restrictions Weight Bearing Restrictions: No      Mobility Bed Mobility Overal bed mobility: Needs Assistance Bed Mobility: Rolling;Supine to Sit;Sit to Supine Rolling: Total assist   Supine to sit: Max assist;HOB elevated Sit to supine: Max assist;HOB elevated   General bed mobility comments: Pt able to grab onto bed rail for support, requiring max hand over had assist to place on bedrail  Transfers                 General transfer  comment: Transfer not attempted this date due to safety concerns. Pt unable to maintain seated balance without max assist.    Balance Overall balance assessment: Needs assistance   Sitting balance-Leahy Scale: Poor Sitting balance - Comments: Left lateral lean while seated EOB. Max assist to maintain balance                                   ADL either performed or assessed with clinical judgement   ADL Overall ADL's : Needs assistance/impaired Eating/Feeding: Total assistance;Bed level Eating/Feeding Details (indicate cue type and reason): Per RN, staff had to feed her this morning. Per sister, pt requires variable min assist to total assist depending on her environment and how she's feeling physically and mentally Grooming: Wash/dry face;Moderate assistance;Bed level   Upper Body Bathing: Total assistance;Bed level   Lower Body Bathing: Total assistance;Bed level   Upper Body Dressing : Total assistance;Bed level   Lower Body Dressing: Total assistance;Bed level   Toilet Transfer: Total assistance(bed level)   Toileting- Clothing Manipulation and Hygiene: Total assistance;Bed level       Functional mobility during ADLs: Total assistance;+2 for physical assistance;+2 for safety/equipment       Vision         Perception     Praxis      Pertinent Vitals/Pain Pain Assessment: Faces Faces Pain Scale: No hurt     Hand Dominance Right   Extremity/Trunk Assessment Upper Extremity Assessment Upper  Extremity Assessment: LUE deficits/detail;RUE deficits/detail;Generalized weakness RUE Deficits / Details: Pt unable to follow AROM instructions. PROM WFL. Clubbed hands. Discordinated movements. RUE Coordination: decreased fine motor;decreased gross motor LUE Deficits / Details: Pt unable to follow AROM instructions. PROM impaired. LUE passive shld flex ~45 degrees. Pt resistive to movements. Clubbed hands. Discordinated movements. LUE Coordination: decreased  fine motor;decreased gross motor   Lower Extremity Assessment Lower Extremity Assessment: Defer to PT evaluation   Cervical / Trunk Assessment Cervical / Trunk Assessment: Kyphotic   Communication Communication Communication: Other (comment)(Cognitive impairment. Speaks short phrases or words)   Cognition Arousal/Alertness: Awake/alert Behavior During Therapy: Flat affect Overall Cognitive Status: History of cognitive impairments - at baseline                                 General Comments: Seems to be close to described baseline.    General Comments  O2 SATs dropped to 86% on 2L while sitting EOB. DOE 2/4.     Exercises     Shoulder Instructions      Home Living Family/patient expects to be discharged to:: Private residence([facility], 24/7 nursing care) Living Arrangements: Group Home Available Help at Discharge: Personal care attendant;Available 24 hours/day Type of Home: Apartment                       Home Equipment: Wheelchair - manual   Additional Comments: Pt is a poor historian. Called pt's sister to obtain PLOF.      Prior Functioning/Environment Level of Independence: Needs assistance  Gait / Transfers Assistance Needed: assist to transfer to her WC ADL's / Homemaking Assistance Needed: Pt requires assist for all self-care and IADL tasks. Communication / Swallowing Assistance Needed: Cognitive impairment. Speaks some short phrases or words.          OT Problem List:        OT Treatment/Interventions:      OT Goals(Current goals can be found in the care plan section) Acute Rehab OT Goals Patient Stated Goal: To go home to her apartment OT Goal Formulation: All assessment and education complete, DC therapy(OT evaluation only)  OT Frequency:     Barriers to D/C:            Co-evaluation              AM-PAC OT "6 Clicks" Daily Activity     Outcome Measure Help from another person eating meals?:  Total Help from another person taking care of personal grooming?: A Lot Help from another person toileting, which includes using toliet, bedpan, or urinal?: Total Help from another person bathing (including washing, rinsing, drying)?: Total Help from another person to put on and taking off regular upper body clothing?: Total Help from another person to put on and taking off regular lower body clothing?: Total 6 Click Score: 7   End of Session Equipment Utilized During Treatment: Oxygen Nurse Communication: Mobility status  Activity Tolerance: Patient limited by fatigue Patient left: in bed;with call bell/phone within reach;with nursing/sitter in room  OT Visit Diagnosis: Muscle weakness (generalized) (M62.81)                Time: [id number] OT Time Calculation (min): 32 min Charges:  OT General Charges $OT Visit: 1 Visit OT Evaluation $OT Eval Moderate Complexity: 1 Mod OT Treatments $Therapeutic Activity: 8-22 mins  Mauri Brooklyn OTR/L [phone]   Mauri Brooklyn  07/19/2019, 12:49 PM

## 2019-07-20 DIAGNOSIS — E039 Hypothyroidism, unspecified: Secondary | ICD-10-CM

## 2019-07-20 DIAGNOSIS — J45909 Unspecified asthma, uncomplicated: Secondary | ICD-10-CM

## 2019-07-20 LAB — CBC
HCT: 33.7 % — ABNORMAL LOW (ref 36.0–46.0)
Hemoglobin: 11.3 g/dL — ABNORMAL LOW (ref 12.0–15.0)
MCH: 30.6 pg (ref 26.0–34.0)
MCHC: 33.5 g/dL (ref 30.0–36.0)
MCV: 91.3 fL (ref 80.0–100.0)
Platelets: 131 10*3/uL — ABNORMAL LOW (ref 150–400)
RBC: 3.69 MIL/uL — ABNORMAL LOW (ref 3.87–5.11)
RDW: 11.9 % (ref 11.5–15.5)
WBC: 7.1 10*3/uL (ref 4.0–10.5)
nRBC: 0 % (ref 0.0–0.2)

## 2019-07-20 LAB — COMPREHENSIVE METABOLIC PANEL
ALT: 18 U/L (ref 0–44)
AST: 20 U/L (ref 15–41)
Albumin: 2.8 g/dL — ABNORMAL LOW (ref 3.5–5.0)
Alkaline Phosphatase: 53 U/L (ref 38–126)
Anion gap: 14 (ref 5–15)
BUN: 42 mg/dL — ABNORMAL HIGH (ref 8–23)
CO2: 27 mmol/L (ref 22–32)
Calcium: 8.4 mg/dL — ABNORMAL LOW (ref 8.9–10.3)
Chloride: 101 mmol/L (ref 98–111)
Creatinine, Ser: 0.97 mg/dL (ref 0.44–1.00)
GFR calc Af Amer: >60 mL/min (ref >60)
GFR calc non Af Amer: 54 mL/min — ABNORMAL LOW (ref >60)
Glucose, Bld: 209 mg/dL — ABNORMAL HIGH (ref 70–99)
Potassium: 3.5 mmol/L (ref 3.5–5.1)
Sodium: 142 mmol/L (ref 135–145)
Total Bilirubin: 0.3 mg/dL (ref 0.3–1.2)
Total Protein: 5.7 g/dL — ABNORMAL LOW (ref 6.5–8.1)

## 2019-07-20 LAB — CULTURE, BLOOD (ROUTINE X 2)
Culture: NO GROWTH
Special Requests: ADEQUATE

## 2019-07-20 MED ORDER — GUAIFENESIN-DM 100-10 MG/5ML PO SYRP: 10.0000 mL | ORAL_SOLUTION | Freq: Four times a day (QID) | ORAL | 0 refills | Status: AC | PRN

## 2019-07-20 MED ORDER — ENSURE ENLIVE PO LIQD: 237.0000 mL | Freq: Two times a day (BID) | ORAL | 0 refills | Status: AC

## 2019-07-20 NOTE — Discharge Summary (Addendum)
Physician Discharge Summary  Leslie Chandler WFU:[id number] DOB: 07/18/1935 DOA: 07/15/2019  PCP: Jordan Hawks, PA-C  Admit date: 07/15/2019 Discharge date: 07/20/2019  Admitted From: Home (group home) Disposition:  Home (group home)  Recommendations for Outpatient Follow-up and new medication changes:  1. Follow up with Ralene Ok B PA-C in 2 weeks. 2. Continue self quarantine for 2 weeks, use a mask in public and maintain physical distancing.  Home Health: no   Equipment/Devices: supplemental 02 per [place] 2 lpm  Discharge Condition: stable  CODE STATUS: full  Diet recommendation: heart healthy   Diet recommendations: Dysphagia 1 (puree);Thin liquid Medication Administration: Crushed with puree Supervision: Staff to assist with self feeding;Full supervision/cueing for compensatory strategies Compensations: Minimize environmental distractions;Slow rate;Small sips/bites Postural Changes and/or Swallow Maneuvers: Seated upright 90 degrees       Brief/Interim Summary: 84 year old female who presented with cough and fever.  She does have the significant past medical history for intellectual disability, severe cognitive impairment, chronic diastolic heart failure, seizure disorder, asthma, hypertension, dyslipidemia, dysphagia and ambulatory dysfunction, chronically wheelchair-bound.  Positive Covid exposure 2 days prior to hospitalization. Patient tested positive for COVID-19 July 15, 2019 (antigen test at [facility] urgent care).  At home she was noted to have increased work of breathing, fever and cough, EMS was called, her oxygen saturation was 93% on room air.  On her initial physical examination her blood pressure was 119/60, pulse rate 76, respiratory rate 22, temperature 100.1 F, oxygen saturation 95% on 2 L of supplemental oxygen per nasal cannula, (room air oxygenation 89%).  She had dry mucous membranes, coarse breath sounds bilaterally, heart S1-S2 present rhythmic, abdomen soft,  no lower extremity edema. Sodium 133, potassium 4.0, chloride 98, bicarb 22, glucose 96, BUN 20, creatinine 0.87, and 5.8, hemoglobin 12.0, hematocrit 36.2, platelets clumped.  Her chest radiograph had bilateral patchy infiltrates, predominantly on the right.  EKG 79 bpm, normal axis, normal intervals, sinus rhythm, no ST segment or T wave changes.  Patient was admitted to the hospital with a working diagnosis of acute hypoxic respiratory failure due to SARS COVID-19 viral pneumonia.  1.  Acute hypoxic respiratory failure due to SARS COVID-19 viral pneumonia.  Patient was admitted to the medical ward, she was treated with systemic corticosteroids and remdesivir with good toleration.  Patient received antitussive agents, bronchodilators and airway clearance techniques with flutter valve and incentive spirometer.  Her oxygen saturation at discharge is 91% on 2 L/min submental oxygen.  2.  Chronic diastolic congestive heart failure.  Acute on chronic decompensation.  Patient had signs of volume overload, received 1 dose of furosemide intravenously.  At discharge will continue carvedilol and lisinopril.  3.  Seizure disorder.  Stable no exacerbation, continue Keppra.  4.  Hypertension.  Continue blood pressure control with carvedilol, lisinopril and hydrochlorothiazide.  5.  Hypothyroidism.  Continue levothyroxine.  6.  Dysphagia.  Patient was evaluated by speech therapy, recommendation for dysphagia 1 diet, aspiration precautions.  7.  Intellectual disability with severe cognitive impairment.  Patient will return to her group home, patient is wheelchair-bound.  8.  Gram-negative bacteremia.  1 bottle out of 4 grew [employer], likely contaminant.  Discussed with infection disease doctor Ilsa Iha.   9. Dyslipidemia. Continue with pravastatin.   10. Iron deficiency anemia. Continue with iron supplementation.  11. Asthma. No signs of acute exacerbation, continue fluticasone inhaler and  montelukast.    12. Obesity/. BMI 39.   Discharge Diagnoses:  Principal Problem:   Acute respiratory disease  due to COVID-19 virus Active Problems:   Hypertension   Asthma   Chronic diastolic heart failure (HCC)   Seizure disorder ([place])   Hypothyroidism   Pneumonia due to COVID-19 virus    Discharge Instructions   Allergies as of 07/20/2019   No Known Allergies     Medication List    STOP taking these medications   guaiFENesin 100 MG/5ML liquid Commonly known as: ROBITUSSIN     TAKE these medications   aspirin 81 MG chewable tablet Chew 81 mg by mouth daily.   carvedilol 3.125 MG tablet Commonly known as: COREG Take 1 tablet (3.125 mg total) by mouth 2 (two) times daily with a meal.   diphenhydrAMINE 25 MG tablet Commonly known as: BENADRYL Take 25 mg by mouth as needed for allergies.   feeding supplement (ENSURE ENLIVE) Liqd Take 237 mLs by mouth 2 (two) times daily between meals.   ferrous sulfate 220 (44 Fe) MG/5ML solution Take 176 mg by mouth at bedtime.   fluticasone 110 MCG/ACT inhaler Commonly known as: FLOVENT HFA Inhale 2 puffs into the lungs 2 (two) times daily.   guaiFENesin-dextromethorphan 100-10 MG/5ML syrup Commonly known as: ROBITUSSIN DM Take 10 mLs by mouth every 6 (six) hours as needed for cough.   IMODIUM PO Take 1 tablet by mouth as needed.   levETIRAcetam 100 MG/ML solution Commonly known as: KEPPRA Take 2.5 mLs by mouth 2 (two) times daily.   levothyroxine 88 MCG tablet Commonly known as: SYNTHROID Take 88 mcg by mouth daily at 12 noon.   lisinopril-hydrochlorothiazide 10-12.5 MG tablet Commonly known as: ZESTORETIC Take 1 tablet by mouth daily.   montelukast 10 MG tablet Commonly known as: SINGULAIR Take 10 mg by mouth every evening.   pravastatin 20 MG tablet Commonly known as: PRAVACHOL Take 20 mg by mouth every evening.   QC HEMORRHOIDAL RE Place 1 application rectally 2 (two) times daily as needed  (hemorrhoids). ointment   raloxifene 60 MG tablet Commonly known as: EVISTA Take 60 mg by mouth daily.   VIACTIV PO Take 1 tablet by mouth every morning.   Vitamin D3 400 UNIT/ML Liqd Take 1,200 Units by mouth daily.       No Known Allergies  Consultations:     Procedures/Studies: Dg Chest Port 1 View  Result Date: 07/15/2019 CLINICAL DATA:  Shortness of breath. EXAM: PORTABLE CHEST 1 VIEW COMPARISON:  November 04, 2018. FINDINGS: Stable cardiomegaly. No pneumothorax is noted. Interval development of patchy bilateral airspace opacities are noted concerning for multifocal pneumonia. No significant pleural effusion is noted. Bony thorax is unremarkable. IMPRESSION: Interval development of patchy bilateral airspace opacities are noted concerning for multifocal pneumonia. Followup radiographs are recommended until resolution. No significant pleural effusion is noted. Electronically Signed   By: Marijo Conception M.D.   On: 07/15/2019 19:21      Procedures:   Subjective: Patient is feeling well, no signs of distress or dyspnea. Tolerating po well.   Discharge Exam: Vitals:   07/19/19 1949 07/20/19 0258  BP: (!) 157/82 (!) 151/75  Pulse: 82   Resp: (!) 24 17  Temp: 98.3 F (36.8 C) 98 F (36.7 C)  SpO2: 92% 91%   Vitals:   07/19/19 1400 07/19/19 1553 07/19/19 1949 07/20/19 0258  BP:  (!) 166/78 (!) 157/82 (!) 151/75  Pulse: 62  82   Resp: 15  (!) 24 17  Temp:  (!) 97.5 F (36.4 C) 98.3 F (36.8 C) 98 F (36.7 C)  TempSrc:  Oral Oral Axillary  SpO2: 91% 95% 92% 91%  Weight:      Height:        General: Not in pain or dyspnea.  Neurology: Awake and alert, patient is non verbal.  E ENT: mild pallor, no icterus, oral mucosa moist Cardiovascular: No JVD. S1-S2 present, rhythmic, no gallops, rubs, or murmurs. No lower extremity edema. Pulmonary: positive breath sounds bilaterally. Gastrointestinal. Abdomen with, no organomegaly, non tender, no rebound or  guarding Skin. No rashes Musculoskeletal: no joint deformities   The results of significant diagnostics from this hospitalization (including imaging, microbiology, ancillary and laboratory) are listed below for reference.     Microbiology: Recent Results (from the past 240 hour(s))  Blood Culture (routine x 2)     Status: None (Preliminary result)   Collection Time: 07/15/19  6:50 PM   Specimen: BLOOD  Result Value Ref Range Status   Specimen Description BLOOD LEFT ANTECUBITAL  Final   Special Requests   Final    BOTTLES DRAWN AEROBIC AND ANAEROBIC Blood Culture adequate volume   Culture   Final    NO GROWTH 4 DAYS Performed at [facility], [address], [place], [place] [address]    Report Status PENDING  Incomplete  Blood Culture (routine x 2)     Status: Abnormal   Collection Time: 07/15/19  6:54 PM   Specimen: BLOOD LEFT HAND  Result Value Ref Range Status   Specimen Description BLOOD LEFT HAND  Final   Special Requests   Final    BOTTLES DRAWN AEROBIC AND ANAEROBIC Blood Culture adequate volume Performed at [facility], [address], [place], [place] [address]    Culture  Setup Time   Final    GRAM NEGATIVE RODS CORRECTED RESULTS AEROBIC BOTTLE ONLY PREVIOUSLY REPORTED AS: AEROCOCCUS SPECIES CORRECTED RESULTS CALLED TO: PHARMD JOHNSTON, N [id number] FCP    Culture PANTOEA AGGLOMERANS (A)  Final   Report Status 07/19/2019 FINAL  Final   Organism ID, Bacteria PANTOEA AGGLOMERANS  Final      Susceptibility   Pantoea agglomerans - MIC*    CEFAZOLIN <=4 SENSITIVE Sensitive     CEFEPIME <=1 SENSITIVE Sensitive     CEFTAZIDIME <=1 SENSITIVE Sensitive     CEFTRIAXONE <=1 SENSITIVE Sensitive     CIPROFLOXACIN <=0.25 SENSITIVE Sensitive     GENTAMICIN <=1 SENSITIVE Sensitive     IMIPENEM <=0.25 SENSITIVE Sensitive     TRIMETH/SULFA <=20 SENSITIVE Sensitive     PIP/TAZO 64 INTERMEDIATE Intermediate     * PANTOEA AGGLOMERANS  Blood Culture ID Panel  (Reflexed)     Status: None   Collection Time: 07/15/19  6:54 PM  Result Value Ref Range Status   Enterococcus species NOT DETECTED NOT DETECTED Final    Comment: CRITICAL RESULT CALLED TO, READ BACK BY AND VERIFIED WITH: PHARMD JOHNSTON, N [phone] FCP    Listeria monocytogenes NOT DETECTED NOT DETECTED Final   Staphylococcus species NOT DETECTED NOT DETECTED Final   Staphylococcus aureus (BCID) NOT DETECTED NOT DETECTED Final   Streptococcus species NOT DETECTED NOT DETECTED Final   Streptococcus agalactiae NOT DETECTED NOT DETECTED Final   Streptococcus pneumoniae NOT DETECTED NOT DETECTED Final   Streptococcus pyogenes NOT DETECTED NOT DETECTED Final   Acinetobacter baumannii NOT DETECTED NOT DETECTED Final   Enterobacteriaceae species NOT DETECTED NOT DETECTED Final   Enterobacter cloacae complex NOT DETECTED NOT DETECTED Final   Escherichia coli NOT DETECTED NOT DETECTED Final  Klebsiella oxytoca NOT DETECTED NOT DETECTED Final   Klebsiella pneumoniae NOT DETECTED NOT DETECTED Final   Proteus species NOT DETECTED NOT DETECTED Final   Serratia marcescens NOT DETECTED NOT DETECTED Final   Haemophilus influenzae NOT DETECTED NOT DETECTED Final   Neisseria meningitidis NOT DETECTED NOT DETECTED Final   Pseudomonas aeruginosa NOT DETECTED NOT DETECTED Final   Candida albicans NOT DETECTED NOT DETECTED Final   Candida glabrata NOT DETECTED NOT DETECTED Final   Candida krusei NOT DETECTED NOT DETECTED Final   Candida parapsilosis NOT DETECTED NOT DETECTED Final   Candida tropicalis NOT DETECTED NOT DETECTED Final    Comment: Performed at [facility], [address] [place], [place] [address]     Labs: BNP (last 3 results) No results for input(s): BNP in the last 8760 hours. Basic Metabolic Panel: Recent Labs  Lab 07/15/19 2047 07/16/19 0505 07/17/19 0404 07/19/19 0225 07/20/19 0220  NA 133* 136 134* 140 142  K 4.0 3.5 3.5 3.8 3.5  CL 98 98 100 103 101  CO2 GLUCOSE 96 104* 134* 187* 209*  BUN 20 20 25* 41* 42*  CREATININE 0.87 0.97 0.67 0.93 0.97  CALCIUM 7.5* 7.7* 7.7* 8.5* 8.4*  MG  --  1.7 1.8  --   --   PHOS  --  2.7  --   --   --    Liver Function Tests: Recent Labs  Lab 07/15/19 2047 07/16/19 0505 07/17/19 0404 07/19/19 0225 07/20/19 0220  AST 36 ALT ALKPHOS 56 60 60 54 53  BILITOT 1.2 0.6 0.7 0.2* 0.3  PROT 5.5* 5.9* 6.0* 5.8* 5.7*  ALBUMIN 2.7* 2.6* 2.9* 2.7* 2.8*   No results for input(s): LIPASE, AMYLASE in the last 168 hours. No results for input(s): AMMONIA in the last 168 hours. CBC: Recent Labs  Lab 07/15/19 1854 07/16/19 0505 07/17/19 0404 07/19/19 0225 07/20/19 0220  WBC 5.8 4.0 4.1 9.4 7.1  NEUTROABS 4.0 2.5  --   --   --   HGB 12.0 11.0* 11.8* 11.4* 11.3*  HCT 36.2 33.1* 35.6* 33.4* 33.7*  MCV 95.3 94.3 91.8 92.3 91.3  PLT PLATELET CLUMPS NOTED ON SMEAR, COUNT APPEARS DECREASED PLATELET CLUMPS NOTED ON SMEAR, COUNT APPEARS DECREASED PLATELET CLUMPS NOTED ON SMEAR, COUNT APPEARS DECREASED 122* 131*   Cardiac Enzymes: No results for input(s): CKTOTAL, CKMB, CKMBINDEX, TROPONINI in the last 168 hours. BNP: Invalid input(s): POCBNP CBG: No results for input(s): GLUCAP in the last 168 hours. D-Dimer Recent Labs    07/19/19 0225  DDIMER 0.86*   Hgb A1c No results for input(s): HGBA1C in the last 72 hours. Lipid Profile No results for input(s): CHOL, HDL, LDLCALC, TRIG, CHOLHDL, LDLDIRECT in the last 72 hours. Thyroid function studies No results for input(s): TSH, T4TOTAL, T3FREE, THYROIDAB in the last 72 hours.  Invalid input(s): FREET3 Anemia work up No results for input(s): VITAMINB12, FOLATE, FERRITIN, TIBC, IRON, RETICCTPCT in the last 72 hours. Urinalysis    Component Value Date/Time   COLORURINE YELLOW 11/04/2018 1012   APPEARANCEUR CLEAR 11/04/2018 1012   LABSPEC 1.013 11/04/2018 1012   PHURINE 6.0 11/04/2018 1012   GLUCOSEU NEGATIVE  11/04/2018 1012   HGBUR NEGATIVE 11/04/2018 1012   BILIRUBINUR NEGATIVE 11/04/2018 1012   KETONESUR NEGATIVE 11/04/2018 1012   PROTEINUR NEGATIVE 11/04/2018 1012   NITRITE NEGATIVE 11/04/2018 1012   LEUKOCYTESUR NEGATIVE 11/04/2018 1012  Sepsis Labs Invalid input(s): PROCALCITONIN,  WBC,  LACTICIDVEN Microbiology Recent Results (from the past 240 hour(s))  Blood Culture (routine x 2)     Status: None (Preliminary result)   Collection Time: 07/15/19  6:50 PM   Specimen: BLOOD  Result Value Ref Range Status   Specimen Description BLOOD LEFT ANTECUBITAL  Final   Special Requests   Final    BOTTLES DRAWN AEROBIC AND ANAEROBIC Blood Culture adequate volume   Culture   Final    NO GROWTH 4 DAYS Performed at [facility], [address] [place], [place] [address]    Report Status PENDING  Incomplete  Blood Culture (routine x 2)     Status: Abnormal   Collection Time: 07/15/19  6:54 PM   Specimen: BLOOD LEFT HAND  Result Value Ref Range Status   Specimen Description BLOOD LEFT HAND  Final   Special Requests   Final    BOTTLES DRAWN AEROBIC AND ANAEROBIC Blood Culture adequate volume Performed at [facility], [address] [place], [place] [address]    Culture  Setup Time   Final    GRAM NEGATIVE RODS CORRECTED RESULTS AEROBIC BOTTLE ONLY PREVIOUSLY REPORTED AS: AEROCOCCUS SPECIES CORRECTED RESULTS CALLED TO: PHARMD JOHNSTON, N [id number] FCP    Culture PANTOEA AGGLOMERANS (A)  Final   Report Status 07/19/2019 FINAL  Final   Organism ID, Bacteria PANTOEA AGGLOMERANS  Final      Susceptibility   Pantoea agglomerans - MIC*    CEFAZOLIN <=4 SENSITIVE Sensitive     CEFEPIME <=1 SENSITIVE Sensitive     CEFTAZIDIME <=1 SENSITIVE Sensitive     CEFTRIAXONE <=1 SENSITIVE Sensitive     CIPROFLOXACIN <=0.25 SENSITIVE Sensitive     GENTAMICIN <=1 SENSITIVE Sensitive     IMIPENEM <=0.25 SENSITIVE Sensitive     TRIMETH/SULFA <=20 SENSITIVE Sensitive     PIP/TAZO 64  INTERMEDIATE Intermediate     * PANTOEA AGGLOMERANS  Blood Culture ID Panel (Reflexed)     Status: None   Collection Time: 07/15/19  6:54 PM  Result Value Ref Range Status   Enterococcus species NOT DETECTED NOT DETECTED Final    Comment: CRITICAL RESULT CALLED TO, READ BACK BY AND VERIFIED WITH: PHARMD JOHNSTON, N [phone] FCP    Listeria monocytogenes NOT DETECTED NOT DETECTED Final   Staphylococcus species NOT DETECTED NOT DETECTED Final   Staphylococcus aureus (BCID) NOT DETECTED NOT DETECTED Final   Streptococcus species NOT DETECTED NOT DETECTED Final   Streptococcus agalactiae NOT DETECTED NOT DETECTED Final   Streptococcus pneumoniae NOT DETECTED NOT DETECTED Final   Streptococcus pyogenes NOT DETECTED NOT DETECTED Final   Acinetobacter baumannii NOT DETECTED NOT DETECTED Final   Enterobacteriaceae species NOT DETECTED NOT DETECTED Final   Enterobacter cloacae complex NOT DETECTED NOT DETECTED Final   Escherichia coli NOT DETECTED NOT DETECTED Final   Klebsiella oxytoca NOT DETECTED NOT DETECTED Final   Klebsiella pneumoniae NOT DETECTED NOT DETECTED Final   Proteus species NOT DETECTED NOT DETECTED Final   Serratia marcescens NOT DETECTED NOT DETECTED Final   Haemophilus influenzae NOT DETECTED NOT DETECTED Final   Neisseria meningitidis NOT DETECTED NOT DETECTED Final   Pseudomonas aeruginosa NOT DETECTED NOT DETECTED Final   Candida albicans NOT DETECTED NOT DETECTED Final   Candida glabrata NOT DETECTED NOT DETECTED Final   Candida krusei NOT DETECTED NOT DETECTED Final   Candida parapsilosis NOT DETECTED NOT DETECTED Final   Candida tropicalis NOT DETECTED NOT  DETECTED Final    Comment: Performed at [facility], [address], [place], [place] [address]     Time coordinating discharge: 45 minutes  SIGNED:   Coralie Keens, MD  [facility] 07/20/2019, 7:53 AM

## 2019-07-20 NOTE — Care Management Important Message (Signed)
Important Message  Patient Details  Name: Leslie Chandler MRN: [id number] Date of Birth: Jul 04, 1935   Medicare Important Message Given:  Yes - Important Message mailed due to current National Emergency  Verbal consent obtained due to current National Emergency  Relationship to patient: Brother/Sister Contact Name: [name] Call Date: 07/20/19  Time: 1222 Phone: [phone] Outcome: Spoke with contact Important Message mailed to: Patient address on file    Delorse Lek 07/20/2019, 12:22 PM

## 2019-07-20 NOTE — Progress Notes (Signed)
SATURATION QUALIFICATIONS: (This note is used to comply with regulatory documentation for home oxygen)  Patient Saturations on Room Air at Rest = 86%  Patient Saturations on Room Air while Ambulating = NT due to 86% at rest  Patient Saturations on 2 Liters of oxygen while Ambulating = 88%  Please briefly explain why patient needs home oxygen: Pt desaturates on RA at rest.   Verdene Lennert, PT, DPT  Acute Rehabilitation [phone] pager [phone] office  @ [facility]: [phone]

## 2019-07-20 NOTE — Discharge Instructions (Signed)
Person Under Monitoring Name: Leslie Chandler  Location: [facility] [address] [place] [address]   Infection Prevention Recommendations for Individuals Confirmed to have, or Being Evaluated for, 2019 Novel Coronavirus (COVID-19) Infection Who Receive Care at Home  Individuals who are confirmed to have, or are being evaluated for, COVID-19 should follow the prevention steps below until a healthcare provider or local or state health department says they can return to normal activities.  Stay home except to get medical care You should restrict activities outside your home, except for getting medical care. Do not go to work, school, or public areas, and do not use public transportation or taxis.  Call ahead before visiting your doctor Before your medical appointment, call the healthcare provider and tell them that you have, or are being evaluated for, COVID-19 infection. This will help the healthcare providers office take steps to keep other people from getting infected. Ask your healthcare provider to call the local or state health department.  Monitor your symptoms Seek prompt medical attention if your illness is worsening (e.g., difficulty breathing). Before going to your medical appointment, call the healthcare provider and tell them that you have, or are being evaluated for, COVID-19 infection. Ask your healthcare provider to call the local or state health department.  Wear a facemask You should wear a facemask that covers your nose and mouth when you are in the same room with other people and when you visit a healthcare provider. People who live with or visit you should also wear a facemask while they are in the same room with you.  Separate yourself from other people in your home As much as possible, you should stay in a different room from other people in your home. Also, you should use a separate bathroom, if available.  Avoid sharing household items You should not  share dishes, drinking glasses, cups, eating utensils, towels, bedding, or other items with other people in your home. After using these items, you should wash them thoroughly with soap and water.  Cover your coughs and sneezes Cover your mouth and nose with a tissue when you cough or sneeze, or you can cough or sneeze into your sleeve. Throw used tissues in a lined trash can, and immediately wash your hands with soap and water for at least 20 seconds or use an alcohol-based hand rub.  Wash your [facility] your hands often and thoroughly with soap and water for at least 20 seconds. You can use an alcohol-based hand sanitizer if soap and water are not available and if your hands are not visibly dirty. Avoid touching your eyes, nose, and mouth with unwashed hands.   Prevention Steps for Caregivers and Household Members of Individuals Confirmed to have, or Being Evaluated for, COVID-19 Infection Being Cared for in the Home  If you live with, or provide care at home for, a person confirmed to have, or being evaluated for, COVID-19 infection please follow these guidelines to prevent infection:  Follow healthcare providers instructions Make sure that you understand and can help the patient follow any healthcare provider instructions for all care.  Provide for the patients basic needs You should help the patient with basic needs in the home and provide support for getting groceries, prescriptions, and other personal needs.  Monitor the patients symptoms If they are getting sicker, call his or her medical provider and tell them that the patient has, or is being evaluated for, COVID-19 infection. This will help the healthcare providers  office take steps to keep other people from getting infected. Ask the healthcare provider to call the local or state health department.  Limit the number of people who have contact with the patient  If possible, have only one caregiver for the  patient.  Other household members should stay in another home or place of residence. If this is not possible, they should stay  in another room, or be separated from the patient as much as possible. Use a separate bathroom, if available.  Restrict visitors who do not have an essential need to be in the home.  Keep older adults, very young children, and other sick people away from the patient Keep older adults, very young children, and those who have compromised immune systems or chronic health conditions away from the patient. This includes people with chronic heart, lung, or kidney conditions, diabetes, and cancer.  Ensure good ventilation Make sure that shared spaces in the home have good air flow, such as from an air conditioner or an opened window, weather permitting.  Wash your hands often  Wash your hands often and thoroughly with soap and water for at least 20 seconds. You can use an alcohol based hand sanitizer if soap and water are not available and if your hands are not visibly dirty.  Avoid touching your eyes, nose, and mouth with unwashed hands.  Use disposable paper towels to dry your hands. If not available, use dedicated cloth towels and replace them when they become wet.  Wear a facemask and gloves  Wear a disposable facemask at all times in the room and gloves when you touch or have contact with the patients blood, body fluids, and/or secretions or excretions, such as sweat, saliva, sputum, nasal mucus, vomit, urine, or feces.  Ensure the mask fits over your nose and mouth tightly, and do not touch it during use.  Throw out disposable facemasks and gloves after using them. Do not reuse.  Wash your hands immediately after removing your facemask and gloves.  If your personal clothing becomes contaminated, carefully remove clothing and launder. Wash your hands after handling contaminated clothing.  Place all used disposable facemasks, gloves, and other waste in a lined  container before disposing them with other household waste.  Remove gloves and wash your hands immediately after handling these items.  Do not share dishes, glasses, or other household items with the patient  Avoid sharing household items. You should not share dishes, drinking glasses, cups, eating utensils, towels, bedding, or other items with a patient who is confirmed to have, or being evaluated for, COVID-19 infection.  After the person uses these items, you should wash them thoroughly with soap and water.  Wash laundry thoroughly  Immediately remove and wash clothes or bedding that have blood, body fluids, and/or secretions or excretions, such as sweat, saliva, sputum, nasal mucus, vomit, urine, or feces, on them.  Wear gloves when handling laundry from the patient.  Read and follow directions on labels of laundry or clothing items and detergent. In general, wash and dry with the warmest temperatures recommended on the label.  Clean all areas the individual has used often  Clean all touchable surfaces, such as counters, tabletops, doorknobs, bathroom fixtures, toilets, phones, keyboards, tablets, and bedside tables, every day. Also, clean any surfaces that may have blood, body fluids, and/or secretions or excretions on them.  Wear gloves when cleaning surfaces the patient has come in contact with.  Use a diluted bleach solution (e.g., dilute bleach with 1  part bleach and 10 parts water) or a household disinfectant with a label that says EPA-registered for coronaviruses. To make a bleach solution at home, add 1 tablespoon of bleach to 1 quart (4 cups) of water. For a larger supply, add  cup of bleach to 1 gallon (16 cups) of water.  Read labels of cleaning products and follow recommendations provided on product labels. Labels contain instructions for safe and effective use of the cleaning product including precautions you should take when applying the product, such as wearing gloves or  eye protection and making sure you have good ventilation during use of the product.  Remove gloves and wash hands immediately after cleaning.  Monitor yourself for signs and symptoms of illness Caregivers and household members are considered close contacts, should monitor their health, and will be asked to limit movement outside of the home to the extent possible. Follow the monitoring steps for close contacts listed on the symptom monitoring form.   ? If you have additional questions, contact your local health department or call the epidemiologist on call at [phone] (available 24/7). ? This guidance is subject to change. For the most up-to-date guidance from [facility], please refer to their website: YouBlogs.pl

## 2019-07-20 NOTE — Social Work (Signed)
Pt for dc back to [facility] group home today with oxygen arranged through [place]. Spoke to [place] at [facility] [phone] who confirmed pt able to return once oxygen delivered and has requested RN call report to her. Magda Paganini with [facility] has arranged home oxygen and anticipates delivery by 4pm. Pt's sister/guardian [name] aware of d/c plan.   Wandra Feinstein, MSW, LCSW [phone] (GV coverage)

## 2019-07-20 NOTE — Progress Notes (Signed)
Physical Therapy Treatment Patient Details Name: Leslie Chandler MRN: [id number] DOB: [birth year] Today's Date: 07/20/2019    History of Present Illness 84 y.o. female admitted on 07/15/19 for cough, fever, increased WOB and COVID exposure, resulting in her own COVID (+) test with acute hypoxic respiratory failure due to viral PNA, gram negative bacteremia.  Pt with significant PMH of chronic diastolic CHF, seizure d/o, HTN, dysphagia, intellectual disability with severe cognitive impairment.     PT Comments    Pt tolerated transfer OOB to chair again today, but remains hypoxic on RA at rest and requires supplemental O2 to maintain sats >88% during mobility.  PT will continue to follow acutely for safe mobility progression.     Follow Up Recommendations  Home health PT;Supervision/Assistance - 24 hour     Equipment Recommendations  Other (comment)(home O2)    Recommendations for Other Services   NA     Precautions / Restrictions Precautions Precautions: Fall;Other (comment) Precaution Comments: monitor O2 sats, no O2 at baseline    Mobility  Bed Mobility Overal bed mobility: Needs Assistance Bed Mobility: Supine to Sit     Supine to sit: Mod assist;HOB elevated;+2 for safety/equipment     General bed mobility comments: Two person mod assist to support trunk, swing legs over EOB and weight shift hips to get far enough out for feet to touch.    Transfers Overall transfer level: Needs assistance Equipment used: 2 person hand held assist Transfers: Sit to/from [employer] Sit to Stand: Mod assist;+2 physical assistance Stand pivot transfers: +2 physical assistance;Mod assist       General transfer comment: Two person mod assist to stand, pt able to grasp with bil arms to assist in stabilizing trunk, take good weight on her legs, but unable to take pivotal steps, so therapists turned her to the chair to sit.    Ambulation/Gait             General Gait  Details: non-ambulatory at baseline          Balance Overall balance assessment: Needs assistance Sitting-balance support: Feet supported;Bilateral upper extremity supported Sitting balance-Leahy Scale: Poor Sitting balance - Comments: posterior lean in sitting with ataxic trunk Postural control: Posterior lean Standing balance support: Bilateral upper extremity supported Standing balance-Leahy Scale: Poor Standing balance comment: two person mod assist to stand EOB.                             Cognition Arousal/Alertness: Awake/alert Behavior During Therapy: Flat affect Overall Cognitive Status: History of cognitive impairments - at baseline                                 General Comments: Seems to be close to described baseline.          General Comments General comments (skin integrity, edema, etc.): O2 sats on RA at rest 86%, needed 2 L O2 [place] Colon to maintain sats >88% with mobility.       Pertinent Vitals/Pain Pain Assessment: Faces Pain Score: 0-No pain                              PT Goals (current goals can now be found in the care plan section) Acute Rehab PT Goals Patient Stated Goal: to go home to her apartment, not to a nursing home.  Progress towards PT goals: Progressing toward goals    Frequency    Min 3X/week      PT Plan Current plan remains appropriate       AM-PAC PT "6 Clicks" Mobility   Outcome Measure  Help needed turning from your back to your side while in a flat bed without using bedrails?: A Lot Help needed moving from lying on your back to sitting on the side of a flat bed without using bedrails?: A Lot Help needed moving to and from a bed to a chair (including a wheelchair)?: A Lot Help needed standing up from a chair using your arms (e.g., wheelchair or bedside chair)?: A Lot Help needed to walk in hospital room?: Total Help needed climbing 3-5 steps with a railing? : Total 6 Click Score: 10     End of Session Equipment Utilized During Treatment: Gait belt Activity Tolerance: Patient tolerated treatment well Patient left: in chair;with call bell/phone within reach;with chair alarm set   PT Visit Diagnosis: Muscle weakness (generalized) (M62.81);Other abnormalities of gait and mobility (R26.89)     Time: 0932-3557 PT Time Calculation (min) (ACUTE ONLY): 20 min  Charges:    [place], PT, DPT  Acute Rehabilitation [phone] pager #[phone] office  @ [facility]: [phone]

## 2019-07-20 NOTE — TOC Transition Note (Signed)
Transition of Care [facility]) - CM/SW Discharge Note   Patient Details  Name: Leslie Chandler MRN: [id number] Date of Birth: [birth year]  [facility] CM/SW Contact:  Amador Cunas,  Phone Number: 07/20/2019, 4:21 PM   Clinical Narrative:   Pt for dc back to [facility] group home today with oxygen arranged through [place]. Spoke to [place] at [facility] [phone] who confirmed pt able to return once oxygen delivered and has requested RN call report to her. Magda Paganini with [facility] has arranged home oxygen and anticipates delivery by 4pm. Pt's sister/guardian Leslie Chandler aware of d/c plan.   Wandra Feinstein, MSW, LCSW [phone] (GV coverage)    Final next level of care: Group Home Barriers to Discharge: No Barriers Identified   Patient Goals and CMS Choice        Discharge Placement                Patient to be transferred to facility by: [place] Name of family member notified: Leslie Chandler, sister/guardian Patient and family notified of of transfer: 07/20/19  Discharge Plan and Services                DME Arranged: Oxygen DME Agency: [place] Date DME Agency Contacted: 07/20/19   Representative spoke with at DME Agency: Magda Paganini            Social Determinants of Health ([place]) Interventions     Readmission Risk Interventions No flowsheet data found.

## 2019-07-25 ENCOUNTER — Emergency Department (HOSPITAL_COMMUNITY)
Admission: EM | Admit: 2019-07-25 | Discharge: 2019-07-25 | Disposition: A | Discharge status: Home / Self Care | Payer: Medicare Other | Attending: Emergency Medicine | Admitting: Emergency Medicine

## 2019-07-25 ENCOUNTER — Other Ambulatory Visit: Payer: Self-pay

## 2019-07-25 DIAGNOSIS — E860 Dehydration: Secondary | ICD-10-CM | POA: Insufficient documentation

## 2019-07-25 DIAGNOSIS — I110 Hypertensive heart disease with heart failure: Secondary | ICD-10-CM | POA: Insufficient documentation

## 2019-07-25 DIAGNOSIS — U071 COVID-19: Secondary | ICD-10-CM | POA: Insufficient documentation

## 2019-07-25 DIAGNOSIS — R2243 Localized swelling, mass and lump, lower limb, bilateral: Secondary | ICD-10-CM | POA: Insufficient documentation

## 2019-07-25 DIAGNOSIS — E119 Type 2 diabetes mellitus without complications: Secondary | ICD-10-CM | POA: Insufficient documentation

## 2019-07-25 DIAGNOSIS — E876 Hypokalemia: Secondary | ICD-10-CM | POA: Diagnosis not present

## 2019-07-25 DIAGNOSIS — Z7982 Long term (current) use of aspirin: Secondary | ICD-10-CM | POA: Insufficient documentation

## 2019-07-25 DIAGNOSIS — R630 Anorexia: Secondary | ICD-10-CM | POA: Diagnosis present

## 2019-07-25 DIAGNOSIS — Z79899 Other long term (current) drug therapy: Secondary | ICD-10-CM | POA: Insufficient documentation

## 2019-07-25 DIAGNOSIS — I509 Heart failure, unspecified: Secondary | ICD-10-CM | POA: Insufficient documentation

## 2019-07-25 LAB — COMPREHENSIVE METABOLIC PANEL
ALT: 19 U/L (ref 0–44)
AST: 21 U/L (ref 15–41)
Albumin: 2.6 g/dL — ABNORMAL LOW (ref 3.5–5.0)
Alkaline Phosphatase: 54 U/L (ref 38–126)
Anion gap: 9 (ref 5–15)
BUN: 23 mg/dL (ref 8–23)
CO2: 28 mmol/L (ref 22–32)
Calcium: 8.4 mg/dL — ABNORMAL LOW (ref 8.9–10.3)
Chloride: 103 mmol/L (ref 98–111)
Creatinine, Ser: 0.93 mg/dL (ref 0.44–1.00)
GFR calc Af Amer: >60 mL/min (ref >60)
GFR calc non Af Amer: 57 mL/min — ABNORMAL LOW (ref >60)
Glucose, Bld: 118 mg/dL — ABNORMAL HIGH (ref 70–99)
Potassium: 3.2 mmol/L — ABNORMAL LOW (ref 3.5–5.1)
Sodium: 140 mmol/L (ref 135–145)
Total Bilirubin: 0.9 mg/dL (ref 0.3–1.2)
Total Protein: 6.1 g/dL — ABNORMAL LOW (ref 6.5–8.1)

## 2019-07-25 LAB — CBC WITH DIFFERENTIAL/PLATELET
Abs Immature Granulocytes: 0.15 10*3/uL — ABNORMAL HIGH (ref 0.00–0.07)
Basophils Absolute: 0 10*3/uL (ref 0.0–0.1)
Basophils Relative: 0 %
Eosinophils Absolute: 0.4 10*3/uL (ref 0.0–0.5)
Eosinophils Relative: 4 %
HCT: 36.7 % (ref 36.0–46.0)
Hemoglobin: 12.5 g/dL (ref 12.0–15.0)
Immature Granulocytes: 1 %
Lymphocytes Relative: 8 %
Lymphs Abs: 0.9 10*3/uL (ref 0.7–4.0)
MCH: 30.9 pg (ref 26.0–34.0)
MCHC: 34.1 g/dL (ref 30.0–36.0)
MCV: 90.8 fL (ref 80.0–100.0)
Monocytes Absolute: 1.1 10*3/uL — ABNORMAL HIGH (ref 0.1–1.0)
Monocytes Relative: 10 %
Neutro Abs: 8.3 10*3/uL — ABNORMAL HIGH (ref 1.7–7.7)
Neutrophils Relative %: 77 %
Platelets: 193 10*3/uL (ref 150–400)
RBC: 4.04 MIL/uL (ref 3.87–5.11)
RDW: 12.2 % (ref 11.5–15.5)
WBC: 10.9 10*3/uL — ABNORMAL HIGH (ref 4.0–10.5)
nRBC: 0 % (ref 0.0–0.2)

## 2019-07-25 LAB — URINALYSIS, ROUTINE W REFLEX MICROSCOPIC
Bilirubin Urine: NEGATIVE
Glucose, UA: NEGATIVE mg/dL
Hgb urine dipstick: NEGATIVE
Ketones, ur: NEGATIVE mg/dL
Leukocytes,Ua: NEGATIVE
Nitrite: NEGATIVE
Protein, ur: 30 mg/dL — AB
Specific Gravity, Urine: 1.023 (ref 1.005–1.030)
pH: 7 (ref 5.0–8.0)

## 2019-07-25 LAB — BRAIN NATRIURETIC PEPTIDE: B Natriuretic Peptide: 107 pg/mL — ABNORMAL HIGH (ref 0.0–100.0)

## 2019-07-25 MED ORDER — POTASSIUM CHLORIDE 20 MEQ/15ML (10%) PO SOLN: 10.0000 meq | Freq: Every day | ORAL | 0 refills | Status: DC

## 2019-07-25 MED ORDER — SODIUM CHLORIDE 0.9 % IV BOLUS
500.0000 mL | Freq: Once | INTRAVENOUS | Status: AC
  Administered 2019-07-25: 500 mL via INTRAVENOUS

## 2019-07-25 NOTE — ED Provider Notes (Signed)
[facility] EMERGENCY DEPARTMENT Provider Note   CSN: [id number] Arrival date & time: 07/25/19  1204     History   Chief Complaint No chief complaint on file.   HPI Leslie Chandler is a 84 y.o. female presenting for decreased appetite and decreased urination.  Level 5 caveat due to ID and MR.  Discussed with Shastity, from group home.  She states patient was diagnosed with Covid and admitted to the hospital, discharged back to the group home 5 days ago.  Since then, she has been weak, as expected.  However she has also had a very poor appetite, taking in very little fluids.  While she did urinate last night, she did not have any urination this morning, which is very atypical for her.  They are concerned about dehydration.  She says that patient is just not acting like her normal self.  Additionally, patient has felt warm to the touch, but has not had a fever.   Additional history obtained from chart review.  Patient with a history of asthma, cognitive delay, seizures, hypertension, MR, ID, recent Covid infection, hypertension, hypothyroidism, CHF (last echo 60-65% in 2016).     HPI  Past Medical History:  Diagnosis Date  . Asthma   . Cognitive developmental delay   . Epilepsia (HCC)   . Hypertension   . Mental retardation   . Osteoporosis     Patient Active Problem List   Diagnosis Date Noted  . Pneumonia due to COVID-19 virus 07/16/2019  . Acute respiratory disease due to COVID-19 virus 07/15/2019  . Seizure disorder (HCC) 07/15/2019  . Hypothyroidism 07/15/2019  . Hyperlipidemia 07/15/2019  . Chronic diastolic heart failure (HCC) 08/08/2015  . Aspiration pneumonia (HCC) 08/06/2015  . Sepsis (HCC) 08/05/2015  . Diarrhea 08/05/2015  . Type 2 diabetes mellitus (HCC) 08/05/2015  . Lactic acidosis 08/05/2015  . CAP (community acquired pneumonia) 04/12/2014  . Mental retardation   . Hypertension   . Asthma   . HCAP (healthcare-associated pneumonia)  04/11/2014    Past Surgical History:  Procedure Laterality Date  . BREAST RECONSTRUCTION    . DENTAL SURGERY    . REDUCTION MAMMAPLASTY Bilateral      OB History   No obstetric history on file.      Home Medications    Prior to Admission medications   Medication Sig Start Date End Date Taking? Authorizing Provider  aspirin 81 MG chewable tablet Chew 81 mg by mouth daily.     [provider]  Calcium-Vitamin D-Vitamin K (VIACTIV PO) Take 1 tablet by mouth every morning.    [provider]  carvedilol (COREG) 3.125 MG tablet Take 1 tablet (3.125 mg total) by mouth 2 (two) times daily with a meal. 08/08/15   Hollice Espy, MD  Cholecalciferol (VITAMIN D3) 400 UNIT/ML LIQD Take 1,200 Units by mouth daily.    [provider]  diphenhydrAMINE (BENADRYL) 25 MG tablet Take 25 mg by mouth as needed for allergies.    [provider]  feeding supplement, ENSURE ENLIVE, (ENSURE ENLIVE) LIQD Take 237 mLs by mouth 2 (two) times daily between meals. 07/20/19 08/19/19  Arrien, York Ram, MD  ferrous sulfate 220 (44 FE) MG/5ML solution Take 176 mg by mouth at bedtime.     [provider]  fluticasone (FLOVENT HFA) 110 MCG/ACT inhaler Inhale 2 puffs into the lungs 2 (two) times daily.    [provider]  guaiFENesin-dextromethorphan (ROBITUSSIN DM) 100-10 MG/5ML syrup Take 10 mLs by  mouth every 6 (six) hours as needed for cough. 07/20/19   Arrien, Jimmy Picket, MD  levETIRAcetam (KEPPRA) 100 MG/ML solution Take 2.5 mLs by mouth 2 (two) times daily. 06/28/19   [provider]  levothyroxine (SYNTHROID, LEVOTHROID) 88 MCG tablet Take 88 mcg by mouth daily at 12 noon.     [provider]  lisinopril-hydrochlorothiazide (PRINZIDE,ZESTORETIC) 10-12.5 MG per tablet Take 1 tablet by mouth daily.    [provider]  Loperamide HCl (IMODIUM PO) Take 1 tablet by mouth as needed.    [provider]  montelukast  (SINGULAIR) 10 MG tablet Take 10 mg by mouth every evening.     [provider]  Phenylephrine-Cocoa Butter (QC HEMORRHOIDAL RE) Place 1 application rectally 2 (two) times daily as needed (hemorrhoids). ointment    [provider]  pravastatin (PRAVACHOL) 20 MG tablet Take 20 mg by mouth every evening.  06/28/19   [provider]  raloxifene (EVISTA) 60 MG tablet Take 60 mg by mouth daily.    [provider]    Family History Family History  Problem Relation Age of Onset  . Cancer Mother     Social History Social History   Tobacco Use  . Smoking status: Never Smoker  . Smokeless tobacco: Never Used  Substance Use Topics  . Alcohol use: No  . Drug use: No     Allergies   Patient has no known allergies.   Review of Systems Review of Systems  Unable to perform ROS: Patient nonverbal  Constitutional: Positive for appetite change.  Genitourinary: Positive for decreased urine volume.     Physical Exam Updated Vital Signs BP (!) 150/125 (BP Location: Right Arm)   Pulse 64   Temp (!) 97.5 F (36.4 C) (Axillary)   Resp 16   SpO2 98%   Physical Exam Vitals signs and nursing note reviewed.  Constitutional:      General: She is not in acute distress.    Appearance: She is well-developed.     Comments: Appears nontoxic  HENT:     Head: Normocephalic and atraumatic.  Eyes:     Conjunctiva/sclera: Conjunctivae normal.     Pupils: Pupils are equal, round, and reactive to light.  Neck:     Musculoskeletal: Normal range of motion and neck supple.  Cardiovascular:     Rate and Rhythm: Normal rate and regular rhythm.     Pulses: Normal pulses.  Pulmonary:     Effort: Pulmonary effort is normal. No respiratory distress.     Breath sounds: Normal breath sounds. No wheezing.     Comments: Clear lung sounds in all fields.  Satting at 94% on 2 L (on O2 since hospital d/c last week) Abdominal:     General: There is no distension.      Palpations: Abdomen is soft. There is no mass.     Tenderness: There is no abdominal tenderness. There is no guarding or rebound.     Comments: No tenderness palpation the abdomen.  Soft without rigidity, guarding, distention.  Negative rebound.  Musculoskeletal: Normal range of motion.     Right lower leg: Edema present.     Left lower leg: Edema present.     Comments: 1-2+ pitting edema bilaterally  Skin:    General: Skin is warm and dry.     Capillary Refill: Capillary refill takes less than 2 seconds.  Neurological:     Mental Status: She is alert and oriented to person, place, and time.  ED Treatments / Results  Labs (all labs ordered are listed, but only abnormal results are displayed) Labs Reviewed - No data to display  EKG None  Radiology No results found.  Procedures Procedures (including critical care time)  Medications Ordered in ED Medications - No data to display   Initial Impression / Assessment and Plan / ED Course  I have reviewed the triage vital signs and the nursing notes.  Pertinent labs & imaging results that were available during my care of the patient were reviewed by me and considered in my medical decision making (see chart for details).        Patient presenting for evaluation of decreased urine output and concern for dehydration in the setting of coronavirus infection.  Physical exam shows patient appears nontoxic.  She is not tachycardic, blood pressure stable.  Discussed with facility director states patient has had poor p.o. intake and decreased urine output, as such we will obtain labs to assess for kidney function and electrolyte abnormality.  Will obtain urine to rule out infection.  If normal, patient can likely be discharged.  Will hold on fluids at this time until BNP returns.   BNP not significantly elevated, will give 500 cc of fluid.  Labs reassuring, mild nonspecific leukocytosis at 10.9.  Potassium slightly low at 3.2, likely  due to decreased oral intake.  Discussed with patient sister who is her healthcare power of attorney and legal guardian, who is aware of patient's work-up so far in the ED and agreeable to discharge if urine is normal.  Pt signed out to B Laveda Normanran, PA-C for f/u on urine and fluids with plan for d/c.   Final Clinical Impressions(s) / ED Diagnoses   Final diagnoses:  None    ED Discharge Orders    None       Alveria ApleyCaccavale, Nicolle Heward, PA-C 07/25/19 1519    Melene PlanFloyd, Dan, DO 07/25/19 1532

## 2019-07-25 NOTE — ED Triage Notes (Signed)
Pt bib ems from home, recent dc from [facility]. Today pt c/o dehydration, decreased po intake and decreased urine output. Pt with hx sever cognitive impairment. Oxygen 92% 1L [place], placed pt on 4L [place] with improvement to 96%. CBG 290. 170/90, HR 80, RR 20. Family reports pt feels hot but have not checked her temperature. EMS temp 97.5.

## 2019-07-25 NOTE — ED Notes (Signed)
Called ptar at 7:35 - Ac

## 2019-07-25 NOTE — ED Notes (Signed)
Pt is refusing to change into gown pt stated pull it down and snatched it from tech.

## 2019-07-25 NOTE — Discharge Instructions (Signed)
Continue taking home medications as prescribed. Take potassium as prescribed for the next 5 days. Try to increase the amount of fluids you are taking. Return to the emergency room with any new, worsening, concerning symptoms

## 2019-07-25 NOTE — ED Notes (Signed)
Patient verbalizes understanding of discharge instructions. Opportunity for questioning and answers were provided. Armband removed by staff, pt discharged from ED.  

## 2019-07-25 NOTE — ED Provider Notes (Signed)
Patient signed out to me at the beginning of shift.  Please refer to previous providers note for the full H&P.  This is an 84 year old female presenting with decreased oral intake and decreased urinary output after being diagnosed with COVID-19, admitted to the hospital and discharged back to her group home approximately 5 days ago.  Labs remarkable for mild hypokalemia with a potassium of 3.2.  Supplementation given.  She does have a underlying history of congestive heart failure with a BNP of 107.  She was given gentle hydration with 500 mL of normal saline.  She able to produce urine her UA shows no evidence of urinary tract infection.  Her labs are reassuring.  I have reached out to her medical coordinator to give an update.  Pt is aware of plan.  Will discharge.   BP 132/83   Pulse 71   Temp (!) 96.9 F (36.1 C) (Rectal)   Resp 16   SpO2 95%   Results for orders placed or performed during the hospital encounter of 07/25/19  CBC with Differential  Result Value Ref Range   WBC 10.9 (H) 4.0 - 10.5 K/uL   RBC 4.04 3.87 - 5.11 MIL/uL   Hemoglobin 12.5 12.0 - 15.0 g/dL   HCT 36.7 36.0 - 46.0 %   MCV 90.8 80.0 - 100.0 fL   MCH 30.9 26.0 - 34.0 pg   MCHC 34.1 30.0 - 36.0 g/dL   RDW 12.2 11.5 - 15.5 %   Platelets 193 150 - 400 K/uL   nRBC 0.0 0.0 - 0.2 %   Neutrophils Relative % 77 %   Neutro Abs 8.3 (H) 1.7 - 7.7 K/uL   Lymphocytes Relative 8 %   Lymphs Abs 0.9 0.7 - 4.0 K/uL   Monocytes Relative 10 %   Monocytes Absolute 1.1 (H) 0.1 - 1.0 K/uL   Eosinophils Relative 4 %   Eosinophils Absolute 0.4 0.0 - 0.5 K/uL   Basophils Relative 0 %   Basophils Absolute 0.0 0.0 - 0.1 K/uL   Immature Granulocytes 1 %   Abs Immature Granulocytes 0.15 (H) 0.00 - 0.07 K/uL  Comprehensive metabolic panel  Result Value Ref Range   Sodium 140 135 - 145 mmol/L   Potassium 3.2 (L) 3.5 - 5.1 mmol/L   Chloride 103 98 - 111 mmol/L   CO2 28 22 - 32 mmol/L   Glucose, Bld 118 (H) 70 - 99 mg/dL   BUN 23 8 - 23 mg/dL   Creatinine, Ser 0.93 0.44 - 1.00 mg/dL   Calcium 8.4 (L) 8.9 - 10.3 mg/dL   Total Protein 6.1 (L) 6.5 - 8.1 g/dL   Albumin 2.6 (L) 3.5 - 5.0 g/dL   AST 21 15 - 41 U/L   ALT 19 0 - 44 U/L   Alkaline Phosphatase 54 38 - 126 U/L   Total Bilirubin 0.9 0.3 - 1.2 mg/dL   GFR calc non Af Amer 57 (L) >60 mL/min   GFR calc Af Amer >60 >60 mL/min   Anion gap 9 5 - 15  Urinalysis, Routine w reflex microscopic  Result Value Ref Range   Color, Urine YELLOW YELLOW   APPearance CLEAR CLEAR   Specific Gravity, Urine 1.023 1.005 - 1.030   pH 7.0 5.0 - 8.0   Glucose, UA NEGATIVE NEGATIVE mg/dL   Hgb urine dipstick NEGATIVE NEGATIVE   Bilirubin Urine NEGATIVE NEGATIVE   Ketones, ur NEGATIVE NEGATIVE mg/dL   Protein, ur 30 (A) NEGATIVE mg/dL   Nitrite  NEGATIVE NEGATIVE   Leukocytes,Ua NEGATIVE NEGATIVE   RBC / HPF 0-5 0 - 5 RBC/hpf   WBC, UA 0-5 0 - 5 WBC/hpf   Bacteria, UA RARE (A) NONE SEEN   Squamous Epithelial / LPF 0-5 0 - 5   Mucus PRESENT   Brain natriuretic peptide  Result Value Ref Range   B Natriuretic Peptide 107.0 (H) 0.0 - 100.0 pg/mL      Fayrene Helper, PA-C 07/25/19 1750    Tilden Fossa, MD 07/26/19 1425

## 2019-08-22 ENCOUNTER — Other Ambulatory Visit: Payer: Self-pay

## 2019-08-22 ENCOUNTER — Ambulatory Visit
Admission: RE | Admit: 2019-08-22 | Discharge: 2019-08-22 | Disposition: A | Discharge status: Home / Self Care | Payer: Medicare Other | Source: Ambulatory Visit | Attending: Family Medicine | Admitting: Family Medicine

## 2019-08-22 DIAGNOSIS — Z1231 Encounter for screening mammogram for malignant neoplasm of breast: Secondary | ICD-10-CM

## 2020-03-22 ENCOUNTER — Encounter (HOSPITAL_COMMUNITY): Payer: Self-pay | Admitting: *Deleted

## 2020-03-22 ENCOUNTER — Other Ambulatory Visit: Payer: Self-pay

## 2020-03-22 ENCOUNTER — Emergency Department (HOSPITAL_COMMUNITY)
Admission: EM | Admit: 2020-03-22 | Discharge: 2020-03-22 | Disposition: A | Discharge status: Home / Self Care | Payer: Medicare Other | Attending: Emergency Medicine | Admitting: Emergency Medicine

## 2020-03-22 ENCOUNTER — Emergency Department (HOSPITAL_COMMUNITY): Payer: Medicare Other

## 2020-03-22 DIAGNOSIS — E119 Type 2 diabetes mellitus without complications: Secondary | ICD-10-CM | POA: Insufficient documentation

## 2020-03-22 DIAGNOSIS — K625 Hemorrhage of anus and rectum: Secondary | ICD-10-CM | POA: Diagnosis not present

## 2020-03-22 DIAGNOSIS — E039 Hypothyroidism, unspecified: Secondary | ICD-10-CM | POA: Diagnosis not present

## 2020-03-22 DIAGNOSIS — I110 Hypertensive heart disease with heart failure: Secondary | ICD-10-CM | POA: Insufficient documentation

## 2020-03-22 DIAGNOSIS — I5032 Chronic diastolic (congestive) heart failure: Secondary | ICD-10-CM | POA: Insufficient documentation

## 2020-03-22 DIAGNOSIS — Z79899 Other long term (current) drug therapy: Secondary | ICD-10-CM | POA: Insufficient documentation

## 2020-03-22 DIAGNOSIS — R109 Unspecified abdominal pain: Secondary | ICD-10-CM | POA: Diagnosis not present

## 2020-03-22 DIAGNOSIS — J45909 Unspecified asthma, uncomplicated: Secondary | ICD-10-CM | POA: Insufficient documentation

## 2020-03-22 LAB — TYPE AND SCREEN
ABO/RH(D): A POS
Antibody Screen: NEGATIVE

## 2020-03-22 LAB — URINALYSIS, ROUTINE W REFLEX MICROSCOPIC
Bilirubin Urine: NEGATIVE
Glucose, UA: 150 mg/dL — AB
Hgb urine dipstick: NEGATIVE
Ketones, ur: NEGATIVE mg/dL
Leukocytes,Ua: NEGATIVE
Nitrite: NEGATIVE
Protein, ur: NEGATIVE mg/dL
Specific Gravity, Urine: 1.018 (ref 1.005–1.030)
pH: 7 (ref 5.0–8.0)

## 2020-03-22 LAB — COMPREHENSIVE METABOLIC PANEL
ALT: 20 U/L (ref 0–44)
AST: 21 U/L (ref 15–41)
Albumin: 3.3 g/dL — ABNORMAL LOW (ref 3.5–5.0)
Alkaline Phosphatase: 63 U/L (ref 38–126)
Anion gap: 12 (ref 5–15)
BUN: 33 mg/dL — ABNORMAL HIGH (ref 8–23)
CO2: 25 mmol/L (ref 22–32)
Calcium: 9.2 mg/dL (ref 8.9–10.3)
Chloride: 103 mmol/L (ref 98–111)
Creatinine, Ser: 1.11 mg/dL — ABNORMAL HIGH (ref 0.44–1.00)
GFR calc Af Amer: 54 mL/min — ABNORMAL LOW (ref >60)
GFR calc non Af Amer: 46 mL/min — ABNORMAL LOW (ref >60)
Glucose, Bld: 202 mg/dL — ABNORMAL HIGH (ref 70–99)
Potassium: 3.9 mmol/L (ref 3.5–5.1)
Sodium: 140 mmol/L (ref 135–145)
Total Bilirubin: 0.5 mg/dL (ref 0.3–1.2)
Total Protein: 6.9 g/dL (ref 6.5–8.1)

## 2020-03-22 LAB — CBC WITH DIFFERENTIAL/PLATELET
Abs Immature Granulocytes: 0.04 10*3/uL (ref 0.00–0.07)
Basophils Absolute: 0 10*3/uL (ref 0.0–0.1)
Basophils Relative: 0 %
Eosinophils Absolute: 0.1 10*3/uL (ref 0.0–0.5)
Eosinophils Relative: 1 %
HCT: 38.4 % (ref 36.0–46.0)
Hemoglobin: 11.9 g/dL — ABNORMAL LOW (ref 12.0–15.0)
Immature Granulocytes: 0 %
Lymphocytes Relative: 7 %
Lymphs Abs: 1 10*3/uL (ref 0.7–4.0)
MCH: 29.4 pg (ref 26.0–34.0)
MCHC: 31 g/dL (ref 30.0–36.0)
MCV: 94.8 fL (ref 80.0–100.0)
Monocytes Absolute: 1 10*3/uL (ref 0.1–1.0)
Monocytes Relative: 7 %
Neutro Abs: 12 10*3/uL — ABNORMAL HIGH (ref 1.7–7.7)
Neutrophils Relative %: 85 %
Platelets: 111 10*3/uL — ABNORMAL LOW (ref 150–400)
RBC: 4.05 MIL/uL (ref 3.87–5.11)
RDW: 14.5 % (ref 11.5–15.5)
WBC: 14.2 10*3/uL — ABNORMAL HIGH (ref 4.0–10.5)
nRBC: 0 % (ref 0.0–0.2)

## 2020-03-22 LAB — POC OCCULT BLOOD, ED: Fecal Occult Bld: POSITIVE — AB

## 2020-03-22 MED ORDER — IOHEXOL 300 MG/ML  SOLN
100.0000 mL | Freq: Once | INTRAMUSCULAR | Status: AC | PRN
  Administered 2020-03-22: 100 mL via INTRAVENOUS

## 2020-03-22 NOTE — ED Provider Notes (Signed)
[facility] EMERGENCY DEPARTMENT Provider Note   CSN: [id number] Arrival date & time: 03/22/20  1015     History Chief Complaint  Patient presents with  . Rectal Bleeding    Leslie Chandler is a 85 y.o. female presenting for blood near the rectum. Patient lives in a group home and staff noticed some blood from the rectum when cleaning her. History is limited due to intellectual disability and mental retardation.       Past Medical History:  Diagnosis Date  . Asthma   . Cognitive developmental delay   . Epilepsia (HCC)   . Hypertension   . Mental retardation   . Osteoporosis     Patient Active Problem List   Diagnosis Date Noted  . Pneumonia due to COVID-19 virus 07/16/2019  . Acute respiratory disease due to COVID-19 virus 07/15/2019  . Seizure disorder (HCC) 07/15/2019  . Hypothyroidism 07/15/2019  . Hyperlipidemia 07/15/2019  . Chronic diastolic heart failure (HCC) 08/08/2015  . Aspiration pneumonia (HCC) 08/06/2015  . Sepsis (HCC) 08/05/2015  . Diarrhea 08/05/2015  . Type 2 diabetes mellitus (HCC) 08/05/2015  . Lactic acidosis 08/05/2015  . CAP (community acquired pneumonia) 04/12/2014  . Mental retardation   . Hypertension   . Asthma   . HCAP (healthcare-associated pneumonia) 04/11/2014    Past Surgical History:  Procedure Laterality Date  . BREAST RECONSTRUCTION    . DENTAL SURGERY    . REDUCTION MAMMAPLASTY Bilateral      OB History   No obstetric history on file.     Family History  Problem Relation Age of Onset  . Cancer Mother     Social History   Tobacco Use  . Smoking status: Never Smoker  . Smokeless tobacco: Never Used  Substance Use Topics  . Alcohol use: No  . Drug use: No    Home Medications Prior to Admission medications   Medication Sig Start Date End Date Taking? Authorizing Provider  aspirin 81 MG chewable tablet Chew 81 mg by mouth daily.     [provider]  Calcium-Vitamin D-Vitamin K (VIACTIV  PO) Take 1 tablet by mouth every morning.    [provider]  carvedilol (COREG) 3.125 MG tablet Take 1 tablet (3.125 mg total) by mouth 2 (two) times daily with a meal. 08/08/15   Hollice Espy, MD  Cholecalciferol (VITAMIN D3) 400 UNIT/ML LIQD Take 1,200 Units by mouth daily.    [provider]  diphenhydrAMINE (BENADRYL) 25 MG tablet Take 25 mg by mouth as needed for allergies.    [provider]  ferrous sulfate 220 (44 FE) MG/5ML solution Take 176 mg by mouth at bedtime.     [provider]  fluticasone (FLOVENT HFA) 110 MCG/ACT inhaler Inhale 2 puffs into the lungs 2 (two) times daily.    [provider]  guaiFENesin-dextromethorphan (ROBITUSSIN DM) 100-10 MG/5ML syrup Take 10 mLs by mouth every 6 (six) hours as needed for cough. 07/20/19   Arrien, York Ram, MD  levETIRAcetam (KEPPRA) 100 MG/ML solution Take 2.5 mLs by mouth 2 (two) times daily. 06/28/19   [provider]  levothyroxine (SYNTHROID, LEVOTHROID) 88 MCG tablet Take 88 mcg by mouth daily at 12 noon.     [provider]  lisinopril-hydrochlorothiazide (PRINZIDE,ZESTORETIC) 10-12.5 MG per tablet Take 1 tablet by mouth daily.    [provider]  Loperamide HCl (IMODIUM PO) Take 1 tablet by mouth as needed.    [provider]  montelukast (SINGULAIR) 10  MG tablet Take 10 mg by mouth every evening.     [provider]  Phenylephrine-Cocoa Butter (QC HEMORRHOIDAL RE) Place 1 application rectally 2 (two) times daily as needed (hemorrhoids). ointment    [provider]  potassium chloride 20 MEQ/15ML (10%) SOLN Take 7.5 mLs (10 mEq total) by mouth daily for 5 days. 07/25/19 07/30/19  Caccavale, Sophia, PA-C  pravastatin (PRAVACHOL) 20 MG tablet Take 20 mg by mouth every evening.  06/28/19   [provider]  raloxifene (EVISTA) 60 MG tablet Take 60 mg by mouth daily.    [provider]    Allergies    Patient has  no known allergies.  Review of Systems   Review of Systems  Physical Exam Updated Vital Signs BP (!) 140/102 (BP Location: Right Arm)   Pulse 100   Temp 98 F (36.7 C) (Oral)   Resp 18   SpO2 91%   Physical Exam Vitals reviewed. Exam conducted with a chaperone present.  Constitutional:      Appearance: She is not toxic-appearing.     Comments: Appears uncomfortable, groaning in bed  HENT:     Head: Normocephalic and atraumatic.     Right Ear: External ear normal.     Left Ear: External ear normal.     Nose: Nose normal.     Mouth/Throat:     Mouth: Mucous membranes are moist.     Pharynx: Oropharynx is clear.  Eyes:     Extraocular Movements: Extraocular movements intact.     Conjunctiva/sclera: Conjunctivae normal.     Pupils: Pupils are equal, round, and reactive to light.  Cardiovascular:     Rate and Rhythm: Normal rate and regular rhythm.     Pulses: Normal pulses.     Heart sounds: Normal heart sounds. No murmur heard.   Pulmonary:     Effort: Pulmonary effort is normal.     Breath sounds: Normal breath sounds. No wheezing.  Abdominal:     General: Abdomen is flat. Bowel sounds are normal.     Palpations: Abdomen is soft.     Tenderness: There is no abdominal tenderness.     Comments: Rectal: Small hemorrhoid, no gross blood, no melena, Stool occult positive  Musculoskeletal:        General: No swelling or tenderness. Normal range of motion.     Cervical back: Normal range of motion and neck supple.  Skin:    General: Skin is warm and dry.     Capillary Refill: Capillary refill takes less than 2 seconds.  Neurological:     General: No focal deficit present.     Mental Status: She is alert.     Comments: Oriented to place and person, essentially nonverbal, answers simple yes no questions occasionally, does not follow commands     ED Results / Procedures / Treatments   Labs (all labs ordered are listed, but only abnormal results are displayed) Labs  Reviewed  COMPREHENSIVE METABOLIC PANEL - Abnormal; Notable for the following components:      Result Value   Glucose, Bld 202 (*)    BUN 33 (*)    Creatinine, Ser 1.11 (*)    Albumin 3.3 (*)    GFR calc non Af Amer 46 (*)    GFR calc Af Amer 54 (*)    All other components within normal limits  CBC WITH DIFFERENTIAL/PLATELET - Abnormal; Notable for the following components:   WBC 14.2 (*)    Hemoglobin 11.9 (*)  Platelets 111 (*)    Neutro Abs 12.0 (*)    All other components within normal limits  URINALYSIS, ROUTINE W REFLEX MICROSCOPIC - Abnormal; Notable for the following components:   Glucose, UA 150 (*)    All other components within normal limits  POC OCCULT BLOOD, ED - Abnormal; Notable for the following components:   Fecal Occult Bld POSITIVE (*)    All other components within normal limits  URINE CULTURE  TYPE AND SCREEN    EKG None  Radiology No results found.  Procedures Procedures (including critical care time)  Medications Ordered in ED Medications  iohexol (OMNIPAQUE) 300 MG/ML solution 100 mL (100 mLs Intravenous Contrast Given 03/22/20 1425)    ED Course  I have reviewed the triage vital signs and the nursing notes.  Pertinent labs & imaging results that were available during my care of the patient were reviewed by me and considered in my medical decision making (see chart for details).  Clinical Course as of Mar 22 1448  Thu Mar 22, 2020  1449 Eosinophil: 1 [JL]    Clinical Course User Index [JL] Quincy Simmonds, MD   MDM Rules/Calculators/A&P                          85 y/o female presenting with concern for rectal bleed when staff at group home were cleaning her. She is hemodynamically stable. Positive for occult blood, slight leukocytosis, hgb 11.9 consistent with baseline. CT abdomen pelvis with large sliding hiatal hernia, no signs of acute diverticulitis, colitis, or bowel ischemia.. Will discharge back to group home with follow up with GI  to discuss fruther evaluation of occult bleeding.  Final Clinical Impression(s) / ED Diagnoses Final diagnoses:  None    Rx / DC Orders ED Discharge Orders    None       Quincy Simmonds, MD 03/22/20 1502    Maia Plan, MD 03/24/20 [phone]

## 2020-03-22 NOTE — ED Notes (Signed)
Spoke to caregiver report given will return to group home

## 2020-03-22 NOTE — ED Notes (Signed)
Ptar  called pt is number 5 on the list 

## 2020-03-22 NOTE — ED Triage Notes (Signed)
Pt is from a group home and there is a number for Kaylyn Lim to call her when patient ready to go back.  When staff was wiping patient they saw some blood from rectal area.  Pt denies any pain at this time.  Pt is wheelchair bound and her DNR is at the facility.

## 2020-03-22 NOTE — ED Notes (Signed)
Pt leaving with PTAR back to group home

## 2020-03-22 NOTE — Discharge Instructions (Addendum)
You were seen in the emergency department today with bleeding from your rectum.  We did not find a clear source of the bleeding.  You will need repeat lab work to check your blood levels in the next 5 days through your primary care doctor's office.  You also need to follow with a gastroenterologist to discuss your symptoms further.  They may want to do a colonoscopy or other tests to determine the source of bleeding.  Return to the emergency department immediately if you develop new or suddenly worsening bleeding, abdominal pain, fever, other severe symptoms.

## 2020-03-23 LAB — URINE CULTURE: Culture: NO GROWTH

## 2020-07-17 ENCOUNTER — Other Ambulatory Visit: Payer: Self-pay | Admitting: Family Medicine

## 2020-07-17 DIAGNOSIS — Z1231 Encounter for screening mammogram for malignant neoplasm of breast: Secondary | ICD-10-CM

## 2020-08-29 ENCOUNTER — Inpatient Hospital Stay: Admission: RE | Admit: 2020-08-29 | Payer: Medicare Other | Source: Ambulatory Visit

## 2020-12-06 ENCOUNTER — Other Ambulatory Visit: Payer: Self-pay

## 2020-12-06 ENCOUNTER — Ambulatory Visit
Admission: RE | Admit: 2020-12-06 | Discharge: 2020-12-06 | Disposition: A | Discharge status: Home / Self Care | Payer: Medicare Other | Source: Ambulatory Visit | Attending: Family Medicine | Admitting: Family Medicine

## 2020-12-06 DIAGNOSIS — Z1231 Encounter for screening mammogram for malignant neoplasm of breast: Secondary | ICD-10-CM

## 2021-04-10 ENCOUNTER — Emergency Department (HOSPITAL_COMMUNITY): Payer: Medicare Other

## 2021-04-10 ENCOUNTER — Encounter (HOSPITAL_COMMUNITY): Payer: Self-pay | Admitting: Family Medicine

## 2021-04-10 ENCOUNTER — Inpatient Hospital Stay (HOSPITAL_COMMUNITY)
Admission: EM | Admit: 2021-04-10 | Discharge: 2021-04-16 | DRG: 202 | Disposition: A | Discharge status: Home / Self Care | Payer: Medicare Other | Source: Ambulatory Visit | Attending: Internal Medicine | Admitting: Internal Medicine

## 2021-04-10 DIAGNOSIS — E785 Hyperlipidemia, unspecified: Secondary | ICD-10-CM | POA: Diagnosis present

## 2021-04-10 DIAGNOSIS — Z8616 Personal history of COVID-19: Secondary | ICD-10-CM

## 2021-04-10 DIAGNOSIS — R1310 Dysphagia, unspecified: Secondary | ICD-10-CM | POA: Diagnosis present

## 2021-04-10 DIAGNOSIS — J45901 Unspecified asthma with (acute) exacerbation: Secondary | ICD-10-CM | POA: Diagnosis present

## 2021-04-10 DIAGNOSIS — I5033 Acute on chronic diastolic (congestive) heart failure: Secondary | ICD-10-CM | POA: Diagnosis present

## 2021-04-10 DIAGNOSIS — R001 Bradycardia, unspecified: Secondary | ICD-10-CM | POA: Diagnosis present

## 2021-04-10 DIAGNOSIS — Z20822 Contact with and (suspected) exposure to covid-19: Secondary | ICD-10-CM | POA: Diagnosis present

## 2021-04-10 DIAGNOSIS — G40909 Epilepsy, unspecified, not intractable, without status epilepticus: Secondary | ICD-10-CM

## 2021-04-10 DIAGNOSIS — I5032 Chronic diastolic (congestive) heart failure: Secondary | ICD-10-CM | POA: Diagnosis not present

## 2021-04-10 DIAGNOSIS — T502X5A Adverse effect of carbonic-anhydrase inhibitors, benzothiadiazides and other diuretics, initial encounter: Secondary | ICD-10-CM | POA: Diagnosis not present

## 2021-04-10 DIAGNOSIS — E1165 Type 2 diabetes mellitus with hyperglycemia: Secondary | ICD-10-CM | POA: Diagnosis present

## 2021-04-10 DIAGNOSIS — M810 Age-related osteoporosis without current pathological fracture: Secondary | ICD-10-CM | POA: Diagnosis present

## 2021-04-10 DIAGNOSIS — Z7982 Long term (current) use of aspirin: Secondary | ICD-10-CM

## 2021-04-10 DIAGNOSIS — F79 Unspecified intellectual disabilities: Secondary | ICD-10-CM

## 2021-04-10 DIAGNOSIS — R0602 Shortness of breath: Secondary | ICD-10-CM

## 2021-04-10 DIAGNOSIS — Z66 Do not resuscitate: Secondary | ICD-10-CM | POA: Diagnosis not present

## 2021-04-10 DIAGNOSIS — I10 Essential (primary) hypertension: Secondary | ICD-10-CM | POA: Diagnosis not present

## 2021-04-10 DIAGNOSIS — Z79890 Hormone replacement therapy (postmenopausal): Secondary | ICD-10-CM

## 2021-04-10 DIAGNOSIS — J4531 Mild persistent asthma with (acute) exacerbation: Secondary | ICD-10-CM | POA: Diagnosis not present

## 2021-04-10 DIAGNOSIS — E119 Type 2 diabetes mellitus without complications: Secondary | ICD-10-CM

## 2021-04-10 DIAGNOSIS — K449 Diaphragmatic hernia without obstruction or gangrene: Secondary | ICD-10-CM | POA: Diagnosis present

## 2021-04-10 DIAGNOSIS — E039 Hypothyroidism, unspecified: Secondary | ICD-10-CM | POA: Diagnosis not present

## 2021-04-10 DIAGNOSIS — Z9981 Dependence on supplemental oxygen: Secondary | ICD-10-CM

## 2021-04-10 DIAGNOSIS — J9601 Acute respiratory failure with hypoxia: Secondary | ICD-10-CM | POA: Diagnosis present

## 2021-04-10 DIAGNOSIS — R479 Unspecified speech disturbances: Secondary | ICD-10-CM | POA: Diagnosis present

## 2021-04-10 DIAGNOSIS — N179 Acute kidney failure, unspecified: Secondary | ICD-10-CM | POA: Diagnosis not present

## 2021-04-10 DIAGNOSIS — N1831 Chronic kidney disease, stage 3a: Secondary | ICD-10-CM

## 2021-04-10 DIAGNOSIS — I130 Hypertensive heart and chronic kidney disease with heart failure and stage 1 through stage 4 chronic kidney disease, or unspecified chronic kidney disease: Secondary | ICD-10-CM | POA: Diagnosis present

## 2021-04-10 DIAGNOSIS — E1122 Type 2 diabetes mellitus with diabetic chronic kidney disease: Secondary | ICD-10-CM | POA: Diagnosis present

## 2021-04-10 DIAGNOSIS — Z79899 Other long term (current) drug therapy: Secondary | ICD-10-CM

## 2021-04-10 DIAGNOSIS — R6250 Unspecified lack of expected normal physiological development in childhood: Secondary | ICD-10-CM | POA: Diagnosis present

## 2021-04-10 LAB — COMPREHENSIVE METABOLIC PANEL
ALT: 20 U/L (ref 0–44)
AST: 21 U/L (ref 15–41)
Albumin: 3.9 g/dL (ref 3.5–5.0)
Alkaline Phosphatase: 90 U/L (ref 38–126)
Anion gap: 8 (ref 5–15)
BUN: 36 mg/dL — ABNORMAL HIGH (ref 8–23)
CO2: 33 mmol/L — ABNORMAL HIGH (ref 22–32)
Calcium: 9.9 mg/dL (ref 8.9–10.3)
Chloride: 104 mmol/L (ref 98–111)
Creatinine, Ser: 0.88 mg/dL (ref 0.44–1.00)
GFR, Estimated: >60 mL/min (ref >60)
Glucose, Bld: 98 mg/dL (ref 70–99)
Potassium: 4.4 mmol/L (ref 3.5–5.1)
Sodium: 145 mmol/L (ref 135–145)
Total Bilirubin: 0.6 mg/dL (ref 0.3–1.2)
Total Protein: 7.5 g/dL (ref 6.5–8.1)

## 2021-04-10 LAB — CBC WITH DIFFERENTIAL/PLATELET
Abs Immature Granulocytes: 0.04 10*3/uL (ref 0.00–0.07)
Basophils Absolute: 0 10*3/uL (ref 0.0–0.1)
Basophils Relative: 1 %
Eosinophils Absolute: 0.1 10*3/uL (ref 0.0–0.5)
Eosinophils Relative: 2 %
HCT: 38.2 % (ref 36.0–46.0)
Hemoglobin: 12 g/dL (ref 12.0–15.0)
Immature Granulocytes: 1 %
Lymphocytes Relative: 25 %
Lymphs Abs: 2 10*3/uL (ref 0.7–4.0)
MCH: 28.8 pg (ref 26.0–34.0)
MCHC: 31.4 g/dL (ref 30.0–36.0)
MCV: 91.8 fL (ref 80.0–100.0)
Monocytes Absolute: 0.8 10*3/uL (ref 0.1–1.0)
Monocytes Relative: 10 %
Neutro Abs: 5 10*3/uL (ref 1.7–7.7)
Neutrophils Relative %: 61 %
Platelets: 152 10*3/uL (ref 150–400)
RBC: 4.16 MIL/uL (ref 3.87–5.11)
RDW: 14.4 % (ref 11.5–15.5)
WBC: 8 10*3/uL (ref 4.0–10.5)
nRBC: 0 % (ref 0.0–0.2)

## 2021-04-10 LAB — BRAIN NATRIURETIC PEPTIDE: B Natriuretic Peptide: 192.6 pg/mL — ABNORMAL HIGH (ref 0.0–100.0)

## 2021-04-10 MED ORDER — MAGNESIUM SULFATE 2 GM/50ML IV SOLN
2.0000 g | Freq: Once | INTRAVENOUS | Status: AC
  Administered 2021-04-11: 2 g via INTRAVENOUS
  Filled 2021-04-10: qty 50

## 2021-04-10 MED ORDER — IPRATROPIUM-ALBUTEROL 0.5-2.5 (3) MG/3ML IN SOLN
3.0000 mL | Freq: Four times a day (QID) | RESPIRATORY_TRACT | Status: DC
  Administered 2021-04-10: 3 mL via RESPIRATORY_TRACT
  Filled 2021-04-10: qty 3

## 2021-04-10 MED ORDER — IPRATROPIUM-ALBUTEROL 0.5-2.5 (3) MG/3ML IN SOLN: 3.0000 mL | Freq: Three times a day (TID) | RESPIRATORY_TRACT | Status: DC

## 2021-04-10 MED ORDER — HYDRALAZINE HCL 20 MG/ML IJ SOLN
5.0000 mg | Freq: Once | INTRAMUSCULAR | Status: AC
  Administered 2021-04-10: 5 mg via INTRAVENOUS
  Filled 2021-04-10: qty 1

## 2021-04-10 MED ORDER — METHYLPREDNISOLONE SODIUM SUCC 40 MG IJ SOLR
40.0000 mg | Freq: Every day | INTRAMUSCULAR | Status: DC
  Administered 2021-04-11 – 2021-04-14 (×5): 40 mg via INTRAVENOUS
  Filled 2021-04-10 (×5): qty 1

## 2021-04-10 MED ORDER — IOHEXOL 350 MG/ML SOLN
60.0000 mL | Freq: Once | INTRAVENOUS | Status: AC | PRN
  Administered 2021-04-10: 60 mL via INTRAVENOUS

## 2021-04-10 MED ORDER — ENOXAPARIN SODIUM 40 MG/0.4ML IJ SOSY
40.0000 mg | PREFILLED_SYRINGE | INTRAMUSCULAR | Status: DC
  Administered 2021-04-11 – 2021-04-16 (×7): 40 mg via SUBCUTANEOUS
  Filled 2021-04-10 (×7): qty 0.4

## 2021-04-10 NOTE — ED Provider Notes (Signed)
[facility]-EMERGENCY DEPT Provider Note   CSN: [id number] Arrival date & time: 04/10/21  1453     History No chief complaint on file.   Leslie Chandler is a 86 y.o. female.  Patient presents from urgent care for evaluation of shortness of breath. Patient with wheezing for several days. UC was unable to obtain a chest xray, and the patient was transferred to the ED. Patient was treated by EMS with alubterol nebs x 2 and 125 mg solumedrol. Patient does not communicate verbally well, but will attempt to answer questions. She will nod her head to yes and no questions. Patient tested negative for COVID on 04/08/21, and neg for COVID and flu today. History of HTN, mental retardation, epilepsy, speech disturbance, and asthma.  The history is provided by medical records. No language interpreter was used.      Past Medical History:  Diagnosis Date   Asthma    Cognitive developmental delay    Epilepsia [facility])    Hypertension    Mental retardation    Osteoporosis     Patient Active Problem List   Diagnosis Date Noted   Pneumonia due to COVID-19 virus 07/16/2019   Acute respiratory disease due to COVID-19 virus 07/15/2019   Seizure disorder (HCC) 07/15/2019   Hypothyroidism 07/15/2019   Hyperlipidemia 07/15/2019   Chronic diastolic heart failure (HCC) 08/08/2015   Aspiration pneumonia (HCC) 08/06/2015   Sepsis (HCC) 08/05/2015   Diarrhea 08/05/2015   Type 2 diabetes mellitus (HCC) 08/05/2015   Lactic acidosis 08/05/2015   CAP (community acquired pneumonia) 04/12/2014   Mental retardation    Hypertension    Asthma    HCAP (healthcare-associated pneumonia) 04/11/2014    Past Surgical History:  Procedure Laterality Date   BREAST RECONSTRUCTION     DENTAL SURGERY     REDUCTION MAMMAPLASTY Bilateral      OB History   No obstetric history on file.     Family History  Problem Relation Age of Onset   Cancer Mother     Social History   Tobacco Use    Smoking status: Never   Smokeless tobacco: Never  Substance Use Topics   Alcohol use: No   Drug use: No    Home Medications Prior to Admission medications   Medication Sig Start Date End Date Taking? Authorizing Provider  acetaminophen (TYLENOL) 500 MG tablet Take 500 mg by mouth every 6 (six) hours as needed for mild pain or headache.    [provider]  albuterol (PROVENTIL) (2.5 MG/3ML) 0.083% nebulizer solution Take 2.5 mg by nebulization every 6 (six) hours as needed for wheezing or shortness of breath.    [provider]  aspirin 81 MG chewable tablet Chew 81 mg by mouth daily.     [provider]  Bismuth Subsalicylate (PEPTO-BISMOL MAX STRENGTH) 525 MG/15ML SUSP Take 5-15 mLs by mouth as needed (for indigestion).    [provider]  calamine lotion Apply 1 application topically as needed for itching.    [provider]  Calcium-Vitamin D-Vitamin K (VIACTIV PO) Take 1 tablet by mouth See admin instructions. Chew 1 tablet by mouth in the morning    [provider]  carvedilol (COREG) 3.125 MG tablet Take 1 tablet (3.125 mg total) by mouth 2 (two) times daily with a meal. 08/08/15   Hollice Espy, MD  Cholecalciferol (VITAMIN D3) 400 UNIT/ML LIQD Take 1,200 Units by mouth daily.    [provider]  dimenhyDRINATE (DRAMAMINE) 50 MG  tablet Take 50 mg by mouth every 8 (eight) hours as needed (as directed).    [provider]  diphenhydrAMINE (BENADRYL) 25 MG tablet Take 25 mg by mouth as needed (as directed).     [provider]  DM-Phenylephrine-Acetaminophen (SUDAFED PE PRESSURE+PAIN+COUGH PO) Take 5 mLs by mouth every 6 (six) hours as needed (for symptoms).    [provider]  famotidine (PEPCID) 10 MG tablet Take 10 mg by mouth daily as needed for heartburn or indigestion.    [provider]  ferrous sulfate 220 (44 FE) MG/5ML solution Take 176 mg by mouth every evening.     [provider]  fluticasone (FLOVENT HFA) 110 MCG/ACT inhaler Inhale 2 puffs into the lungs 2 (two) times daily.    [provider]  guaiFENesin-dextromethorphan (ROBITUSSIN DM) 100-10 MG/5ML syrup Take 10 mLs by mouth every 6 (six) hours as needed for cough. 07/20/19   Arrien, York Ram, MD  hydrocortisone cream 1 % Apply 1 application topically as needed for itching.    [provider]  ibuprofen (ADVIL) 200 MG tablet Take 200 mg by mouth every 6 (six) hours as needed for headache or mild pain.    [provider]  levETIRAcetam (KEPPRA) 100 MG/ML solution Take 2.5 mLs by mouth 2 (two) times daily. 06/28/19   [provider]  levothyroxine (SYNTHROID, LEVOTHROID) 88 MCG tablet Take 88 mcg by mouth daily at 12 noon.     [provider]  lisinopril-hydrochlorothiazide (PRINZIDE,ZESTORETIC) 10-12.5 MG per tablet Take 1 tablet by mouth daily.    [provider]  loperamide (IMODIUM A-D) 2 MG tablet Take 2 mg by mouth 4 (four) times daily as needed for diarrhea or loose stools.    [provider]  Magnesium Hydroxide (MILK OF MAGNESIA CONCENTRATE) 2400 MG/10ML SUSP Take by mouth as needed (as directed).    [provider]  montelukast (SINGULAIR) 10 MG tablet Take 10 mg by mouth every evening.     [provider]  Neomycin-Bacitracin-Polymyxin (FIRST AID ANTIBIOTIC) [phone] MG-UNIT OINT Apply 1 application topically as needed (as directed- to affected sites).    [provider]  Nutritional Supplements (ENSURE ENLIVE PO) Take 237 mLs by mouth 2 (two) times daily between meals.    [provider]  Phenylephrine-Cocoa Butter (QC HEMORRHOIDAL RE) Place 1 application rectally 2 (two) times daily as needed (hemorrhoids). ointment Patient not taking: Reported on 03/22/2020    [provider]  potassium chloride 20 MEQ/15ML (10%) SOLN Take 7.5 mLs (10 mEq total) by mouth daily for 5 days. Patient  not taking: Reported on 03/22/2020 07/25/19 03/22/20  Caccavale, Sophia, PA-C  pravastatin (PRAVACHOL) 20 MG tablet Take 20 mg by mouth every evening.  06/28/19   [provider]  raloxifene (EVISTA) 60 MG tablet Take 60 mg by mouth daily.    [provider]  Skin Protectants, Misc. (ALOE VESTA PROTECTIVE) OINT Apply 1 application topically as needed (as directed- to affected sites).    [provider]  SUNSCREENS EX Apply 1 application topically as needed (for skin protection).    [provider]    Allergies    Patient has no known allergies.  Review of Systems   Review of Systems  Constitutional:  Negative for fever.  HENT:  Positive for congestion.   Respiratory:  Positive for shortness of breath and wheezing.   Cardiovascular:  Negative for chest pain.  Neurological:  Positive for speech difficulty.  All other systems  reviewed and are negative.  Physical Exam Updated Vital Signs Pulse (!) 56   SpO2 100%   Physical Exam Vitals reviewed.  HENT:     Head: Normocephalic.     Nose: Nose normal.  Eyes:     Conjunctiva/sclera: Conjunctivae normal.  Cardiovascular:     Rate and Rhythm: Bradycardia present.  Pulmonary:     Effort: Respiratory distress present.     Breath sounds: Wheezing present.  Abdominal:     Palpations: Abdomen is soft.  Skin:    General: Skin is warm and dry.  Neurological:     Mental Status: She is alert. Mental status is at baseline.    ED Results / Procedures / Treatments   Labs (all labs ordered are listed, but only abnormal results are displayed) Labs Reviewed  COMPREHENSIVE METABOLIC PANEL - Abnormal; Notable for the following components:      Result Value   CO2 33 (*)    BUN 36 (*)    All other components within normal limits  BRAIN NATRIURETIC PEPTIDE - Abnormal; Notable for the following components:   B Natriuretic Peptide 192.6 (*)    All other components within normal limits  CBC WITH  DIFFERENTIAL/PLATELET    EKG None  Radiology CT Chest W Contrast  Result Date: 04/10/2021 CLINICAL DATA:  86 year old with shortness of breath. Abnormal x-ray. EXAM: CT CHEST WITH CONTRAST TECHNIQUE: Multidetector CT imaging of the chest was performed during intravenous contrast administration. CONTRAST:  52mL OMNIPAQUE IOHEXOL 350 MG/ML SOLN COMPARISON:  Chest radiograph earlier today.  Chest CT 04/11/2014 FINDINGS: Cardiovascular: Aortic atherosclerosis and tortuosity. Exam not tailored for pulmonary artery evaluation, no obvious central pulmonary embolus. Borderline cardiomegaly. No pericardial effusion. There are coronary artery calcifications. Mediastinum/Nodes: Large hiatal hernia with the entire stomach being intrathoracic. Hiatal hernia also contains the pancreas and portions of the splenic vein. The esophagus is fluid-filled and mildly dilated to the level of the thoracic inlet. No thyroid nodule. Scattered small mediastinal and hilar lymph nodes, not enlarged by size criteria. Lungs/Pleura: Breathing motion artifact limits detailed assessment. Compressive atelectasis in the lung bases related to large hiatal hernia. Heterogeneous pulmonary parenchyma. Tiny subpleural nodules in the left upper lobe, series 7, image 38 these are present on prior exam from 2015 and considered benign. There is no suspicious pulmonary nodule. Linear atelectasis in the periphery of the left upper lobe which may account for radiographic appearance. Motion obscures tracheobronchial evaluation,. No pleural fluid. Upper Abdomen: Entire stomach is intrathoracic. More than half of the pancreas extends into hiatal hernia and is intrathoracic. Multiple intraluminal gallstones. Suspected hepatic steatosis. No acute upper abdominal findings. Musculoskeletal: Multiple remote right rib fractures. Side exaggerated thoracic kyphosis. Vertebral body hemangioma within T4. Macrolobulated left breast calcification is stable from prior.  Additional scattered calcifications within both breasts. No suspicious chest wall abnormality. IMPRESSION: 1. Large hiatal hernia with the entire stomach being intrathoracic. No gastric inflammation or obstruction. Hiatal hernia also contains the pancreas and portions of the splenic vein. The esophagus is fluid-filled and mildly dilated to the level of the thoracic inlet, suggesting reflux or dysmotility. 2. Linear atelectasis in the periphery of the left upper lobe may account for radiographic appearance. No suspicious pulmonary nodule. 3. Mild cardiomegaly.  Aortic atherosclerosis. 4. Heterogeneous pulmonary parenchyma, can be seen with small vessel or small airways disease. 5. Cholelithiasis incidentally noted in the upper abdomen. Aortic Atherosclerosis (ICD10-I70.0). Electronically Signed   By: Narda Rutherford M.D.   On: 04/10/2021 17:32   DG  Chest Port 1 View  Result Date: 04/10/2021 CLINICAL DATA:  Cough, wheezing and shortness of breath in an 86 year old female. EXAM: PORTABLE CHEST 1 VIEW COMPARISON:  July 15, 2019. FINDINGS: EKG leads project over the chest. Heart size remains markedly enlarged. Signs of central pulmonary vascular engorgement and increased interstitial markings. No lobar consolidative process. Density along the LEFT pleural surface at the site of previous peripheral opacity measures up to 13 mm. This persists with other areas of patchy density seen on the prior study largely resolved. Signs of hiatal hernia in the retrocardiac region. On limited assessment no acute skeletal process. IMPRESSION: Marked cardiomegaly with signs of potential early CHF/volume overload. No lobar consolidation. Potential scarring or nodularity along the LEFT lateral chest this patient with previous multifocal pneumonia. Given persistence suggest PA and lateral chest radiograph on follow-up when the patient is able or chest CT for further evaluation. Signs of hiatal hernia in the retrocardiac region.  Electronically Signed   By: Donzetta KohutGeoffrey  Wile M.D.   On: 04/10/2021 16:02    Procedures Procedures   Medications Ordered in ED Medications - No data to display  ED Course  I have reviewed the triage vital signs and the nursing notes.  Pertinent labs & imaging results that were available during my care of the patient were reviewed by me and considered in my medical decision making (see chart for details).  CXR shows cardiomegaly with potential CHF/volume overload. Persistent nodularity on left in patient with previous multifocal pneumonia.  Large hiatal hernia with entire stomach now intrathoracic (significant change compared to CT A/P 03/22/20).  Patient seen by and discussed with Dr. Durwin Noraixon.  Will discuss with hospitalist and request admission.  Consulted with Dr. Cyndia Bentu (hospitalist)--will admit.  6:33 PM Sister at bedside.  Minimal wheezing on reassessment. Patient is at baseline mentation and interactivity.   MDM Rules/Calculators/A&P                            Final Clinical Impression(s) / ED Diagnoses Final diagnoses:  Shortness of breath  Large hiatal hernia    Rx / DC Orders ED Discharge Orders     None        Felicie MornSmith, Anye Brose, NP 04/10/21 2002    Gloris Manchesterixon, Ryan, MD 04/11/21 [phone]

## 2021-04-10 NOTE — ED Triage Notes (Signed)
PT arrives via EMS after being seen at [facility] for sob and difficulty breathing for the past couple of days. Pt received 125mg  solumedrol and two nebs by EMS. Pt lives in a group home.

## 2021-04-11 ENCOUNTER — Observation Stay (HOSPITAL_COMMUNITY): Payer: Medicare Other

## 2021-04-11 ENCOUNTER — Other Ambulatory Visit: Payer: Self-pay

## 2021-04-11 DIAGNOSIS — Z9981 Dependence on supplemental oxygen: Secondary | ICD-10-CM | POA: Diagnosis not present

## 2021-04-11 DIAGNOSIS — Z79890 Hormone replacement therapy (postmenopausal): Secondary | ICD-10-CM | POA: Diagnosis not present

## 2021-04-11 DIAGNOSIS — G40909 Epilepsy, unspecified, not intractable, without status epilepticus: Secondary | ICD-10-CM | POA: Diagnosis present

## 2021-04-11 DIAGNOSIS — E039 Hypothyroidism, unspecified: Secondary | ICD-10-CM | POA: Diagnosis present

## 2021-04-11 DIAGNOSIS — R1310 Dysphagia, unspecified: Secondary | ICD-10-CM | POA: Diagnosis present

## 2021-04-11 DIAGNOSIS — I5033 Acute on chronic diastolic (congestive) heart failure: Secondary | ICD-10-CM | POA: Diagnosis not present

## 2021-04-11 DIAGNOSIS — R479 Unspecified speech disturbances: Secondary | ICD-10-CM | POA: Diagnosis present

## 2021-04-11 DIAGNOSIS — R609 Edema, unspecified: Secondary | ICD-10-CM | POA: Diagnosis not present

## 2021-04-11 DIAGNOSIS — M810 Age-related osteoporosis without current pathological fracture: Secondary | ICD-10-CM | POA: Diagnosis present

## 2021-04-11 DIAGNOSIS — N1831 Chronic kidney disease, stage 3a: Secondary | ICD-10-CM | POA: Diagnosis present

## 2021-04-11 DIAGNOSIS — T502X5A Adverse effect of carbonic-anhydrase inhibitors, benzothiadiazides and other diuretics, initial encounter: Secondary | ICD-10-CM | POA: Diagnosis not present

## 2021-04-11 DIAGNOSIS — Z79899 Other long term (current) drug therapy: Secondary | ICD-10-CM | POA: Diagnosis not present

## 2021-04-11 DIAGNOSIS — R6250 Unspecified lack of expected normal physiological development in childhood: Secondary | ICD-10-CM | POA: Diagnosis present

## 2021-04-11 DIAGNOSIS — E1122 Type 2 diabetes mellitus with diabetic chronic kidney disease: Secondary | ICD-10-CM | POA: Diagnosis present

## 2021-04-11 DIAGNOSIS — F79 Unspecified intellectual disabilities: Secondary | ICD-10-CM | POA: Diagnosis present

## 2021-04-11 DIAGNOSIS — I5032 Chronic diastolic (congestive) heart failure: Secondary | ICD-10-CM | POA: Diagnosis not present

## 2021-04-11 DIAGNOSIS — J9601 Acute respiratory failure with hypoxia: Secondary | ICD-10-CM | POA: Diagnosis present

## 2021-04-11 DIAGNOSIS — Z8616 Personal history of COVID-19: Secondary | ICD-10-CM | POA: Diagnosis not present

## 2021-04-11 DIAGNOSIS — K449 Diaphragmatic hernia without obstruction or gangrene: Secondary | ICD-10-CM | POA: Diagnosis present

## 2021-04-11 DIAGNOSIS — I130 Hypertensive heart and chronic kidney disease with heart failure and stage 1 through stage 4 chronic kidney disease, or unspecified chronic kidney disease: Secondary | ICD-10-CM | POA: Diagnosis present

## 2021-04-11 DIAGNOSIS — J4531 Mild persistent asthma with (acute) exacerbation: Secondary | ICD-10-CM | POA: Diagnosis present

## 2021-04-11 DIAGNOSIS — N179 Acute kidney failure, unspecified: Secondary | ICD-10-CM | POA: Diagnosis not present

## 2021-04-11 DIAGNOSIS — E1165 Type 2 diabetes mellitus with hyperglycemia: Secondary | ICD-10-CM | POA: Diagnosis present

## 2021-04-11 DIAGNOSIS — Z20822 Contact with and (suspected) exposure to covid-19: Secondary | ICD-10-CM | POA: Diagnosis present

## 2021-04-11 DIAGNOSIS — Z7982 Long term (current) use of aspirin: Secondary | ICD-10-CM | POA: Diagnosis not present

## 2021-04-11 DIAGNOSIS — R0602 Shortness of breath: Secondary | ICD-10-CM | POA: Diagnosis present

## 2021-04-11 DIAGNOSIS — Z66 Do not resuscitate: Secondary | ICD-10-CM | POA: Diagnosis not present

## 2021-04-11 LAB — GLUCOSE, CAPILLARY
Glucose-Capillary: 200 mg/dL — ABNORMAL HIGH (ref 70–99)
Glucose-Capillary: 199 mg/dL — ABNORMAL HIGH (ref 70–99)
Glucose-Capillary: 183 mg/dL — ABNORMAL HIGH (ref 70–99)

## 2021-04-11 LAB — SARS CORONAVIRUS 2 (TAT 6-24 HRS): SARS Coronavirus 2: NEGATIVE

## 2021-04-11 MED ORDER — FERROUS SULFATE 300 (60 FE) MG/5ML PO SYRP
176.0000 mg | ORAL_SOLUTION | Freq: Every evening | ORAL | Status: DC
  Administered 2021-04-11 – 2021-04-15 (×5): 174 mg via ORAL
  Filled 2021-04-11 (×6): qty 5

## 2021-04-11 MED ORDER — LISINOPRIL 10 MG PO TABS
10.0000 mg | ORAL_TABLET | Freq: Every day | ORAL | Status: DC
  Administered 2021-04-11 – 2021-04-14 (×4): 10 mg via ORAL
  Filled 2021-04-11 (×4): qty 1

## 2021-04-11 MED ORDER — INSULIN ASPART 100 UNIT/ML IJ SOLN
0.0000 [IU] | Freq: Every day | INTRAMUSCULAR | Status: DC
  Administered 2021-04-13: 2 [IU] via SUBCUTANEOUS

## 2021-04-11 MED ORDER — CARVEDILOL 3.125 MG PO TABS
3.1250 mg | ORAL_TABLET | Freq: Two times a day (BID) | ORAL | Status: DC
  Administered 2021-04-11 – 2021-04-16 (×12): 3.125 mg via ORAL
  Filled 2021-04-11 (×12): qty 1

## 2021-04-11 MED ORDER — FAMOTIDINE 20 MG PO TABS: 10.0000 mg | ORAL_TABLET | Freq: Every day | ORAL | Status: DC | PRN

## 2021-04-11 MED ORDER — ASPIRIN 81 MG PO CHEW
81.0000 mg | CHEWABLE_TABLET | Freq: Every day | ORAL | Status: DC
  Administered 2021-04-11 – 2021-04-16 (×6): 81 mg via ORAL
  Filled 2021-04-11 (×6): qty 1

## 2021-04-11 MED ORDER — GUAIFENESIN 100 MG/5ML PO SOLN
5.0000 mL | ORAL | Status: DC | PRN
  Administered 2021-04-11 – 2021-04-15 (×7): 100 mg via ORAL
  Filled 2021-04-11 (×7): qty 10

## 2021-04-11 MED ORDER — ACETAMINOPHEN 160 MG/5ML PO SOLN
650.0000 mg | Freq: Once | ORAL | Status: AC
  Administered 2021-04-11: 650 mg via ORAL
  Filled 2021-04-11: qty 20.3

## 2021-04-11 MED ORDER — PRAVASTATIN SODIUM 20 MG PO TABS
20.0000 mg | ORAL_TABLET | Freq: Every evening | ORAL | Status: DC
  Administered 2021-04-11 – 2021-04-16 (×6): 20 mg via ORAL
  Filled 2021-04-11 (×6): qty 1

## 2021-04-11 MED ORDER — LEVETIRACETAM 100 MG/ML PO SOLN
250.0000 mg | Freq: Two times a day (BID) | ORAL | Status: DC
  Administered 2021-04-11 – 2021-04-16 (×13): 250 mg via ORAL
  Filled 2021-04-11 (×13): qty 2.5

## 2021-04-11 MED ORDER — PANTOPRAZOLE SODIUM 40 MG IV SOLR
40.0000 mg | Freq: Every day | INTRAVENOUS | Status: DC
  Administered 2021-04-11 – 2021-04-15 (×5): 40 mg via INTRAVENOUS
  Filled 2021-04-11 (×5): qty 40

## 2021-04-11 MED ORDER — LEVOTHYROXINE SODIUM 88 MCG PO TABS
88.0000 ug | ORAL_TABLET | Freq: Every day | ORAL | Status: DC
  Administered 2021-04-11 – 2021-04-16 (×6): 88 ug via ORAL
  Filled 2021-04-11 (×6): qty 1

## 2021-04-11 MED ORDER — ALBUTEROL SULFATE (2.5 MG/3ML) 0.083% IN NEBU
2.5000 mg | INHALATION_SOLUTION | Freq: Four times a day (QID) | RESPIRATORY_TRACT | Status: DC
  Administered 2021-04-11 – 2021-04-14 (×14): 2.5 mg via RESPIRATORY_TRACT
  Filled 2021-04-11 (×13): qty 3

## 2021-04-11 MED ORDER — BISMUTH SUBSALICYLATE 262 MG/15ML PO SUSP
5.0000 mL | Freq: Every day | ORAL | Status: DC | PRN
  Filled 2021-04-11: qty 236

## 2021-04-11 MED ORDER — ACETAMINOPHEN 500 MG PO TABS
500.0000 mg | ORAL_TABLET | Freq: Four times a day (QID) | ORAL | Status: DC | PRN
  Administered 2021-04-15 – 2021-04-16 (×2): 500 mg via ORAL
  Filled 2021-04-11 (×2): qty 1

## 2021-04-11 MED ORDER — STERILE WATER FOR INJECTION IJ SOLN
INTRAMUSCULAR | Status: AC
  Administered 2021-04-11: 10 mL
  Filled 2021-04-11: qty 10

## 2021-04-11 MED ORDER — MONTELUKAST SODIUM 10 MG PO TABS
10.0000 mg | ORAL_TABLET | Freq: Every evening | ORAL | Status: DC
  Administered 2021-04-11 – 2021-04-16 (×6): 10 mg via ORAL
  Filled 2021-04-11 (×6): qty 1

## 2021-04-11 MED ORDER — INSULIN ASPART 100 UNIT/ML IJ SOLN
0.0000 [IU] | Freq: Three times a day (TID) | INTRAMUSCULAR | Status: DC
  Administered 2021-04-11 – 2021-04-12 (×4): 3 [IU] via SUBCUTANEOUS
  Administered 2021-04-12: 2 [IU] via SUBCUTANEOUS
  Administered 2021-04-13 – 2021-04-14 (×3): 3 [IU] via SUBCUTANEOUS
  Administered 2021-04-15: 5 [IU] via SUBCUTANEOUS
  Administered 2021-04-15: 3 [IU] via SUBCUTANEOUS
  Administered 2021-04-16: 2 [IU] via SUBCUTANEOUS
  Administered 2021-04-16: 5 [IU] via SUBCUTANEOUS

## 2021-04-11 MED ORDER — LISINOPRIL-HYDROCHLOROTHIAZIDE 10-12.5 MG PO TABS: 1.0000 | ORAL_TABLET | Freq: Every day | ORAL | Status: DC

## 2021-04-11 MED ORDER — HYDROCHLOROTHIAZIDE 12.5 MG PO CAPS
12.5000 mg | ORAL_CAPSULE | Freq: Every day | ORAL | Status: DC
  Administered 2021-04-11 – 2021-04-14 (×5): 12.5 mg via ORAL
  Filled 2021-04-11 (×4): qty 1

## 2021-04-11 NOTE — Progress Notes (Signed)
Received phone call from clinical coordinator from pt's group home. Pt had a prescription for albuterol nebulizer at home, but it has expired. If pt is to continue with albuterol in the future, please update prescription at discharge.

## 2021-04-11 NOTE — Evaluation (Signed)
Clinical/Bedside Swallow Evaluation Patient Details  Name: Leslie Chandler MRN: [id number] Date of Birth: 1934-11-15  Today's Date: 04/11/2021 Time: SLP Start Time (ACUTE ONLY): 1715 SLP Stop Time (ACUTE ONLY): 1740 SLP Time Calculation (min) (ACUTE ONLY): 25 min  Past Medical History:  Past Medical History:  Diagnosis Date   Asthma    Cognitive developmental delay    Epilepsia (HCC)    Hypertension    Mental retardation    Osteoporosis    Past Surgical History:  Past Surgical History:  Procedure Laterality Date   BREAST RECONSTRUCTION     DENTAL SURGERY     REDUCTION MAMMAPLASTY Bilateral    HPI:  Pt is an 86 yo female with h/o intellectual disability, prior COVID, hiatal hernia, wheelchair/bed found admitted to hospital with asthma exacerbation.  Pt has undergone several prior MBS studies with most recent one 07/2015 showing penetration from large straw sips and mild vallecular residue that both cleared with an extra swallow. Dys 2 solids and thin liquids were recommended.  Imaging showed Large hiatal hernia with the entire stomach being intrathoracic.No gastric inflammation or obstruction. Hiatal hernia also contains the pancreas and portions of the splenic vein. The esophagus is fluid-filled and mildly dilated to the level of the thoracic inlet, suggesting reflux or dysmotility.   Per notes, the findings show significant change compared to A-P CT 03/22/2020.  She is currently on a dys2/thin diet in the hospital. No family present - her sister Leslie Chandler is listed as her guardian per Charity fundraiser, Hospital doctor.  From notes in hard chart, pt is a DNR at her facility - made RN aware of this - after which she informed MD.  Pt also with visit to [facility] after choking episode in January 2022 per review of chart - and CXR showed right lobe ATX.   Assessment / Plan / Recommendation Clinical Impression  Pt presents with baseline cough noted when SLP was outside of pt's room.  She was repositioned in bed with RN, and RN  provided her with medications crushed.    Pt does appear with wheeze with effort *including swallowing* but she exhales post-swallow with 90% of swallows, which maximizes her airway protection.  She did not demonstrate indication of aspiration or pharyngeal dysphagia with water, applesauce or ice cream. Overt cough observed s/p swallow of larger cracker bolus - concerning for airway infiltration. Smaller bites tolerated better.    Advise pt continue dys2 - placing minced foods in puree to improve cohesiveness, thus decrease risk of posterior oral loss and maximize pt's QOL.    Primary aspiration risk is certain to be her large hiatal hernia and suspected reflux, esophageal dysmotility based on imaging.  Recommend small frequent meals, staying upright after eating to aid esophageal clearance.    Requested RN set up oral suction in room for emergent use as pt has a purewick.    SLP does not recommend repeat MBS as do not anticipate will change pt's outcomes.  No follow up needed as pt on a diet with compensation strategies to mitigate aspiration risk. SLP Visit Diagnosis: Dysphagia, unspecified (R13.10);Dysphagia, oral phase (R13.11)    Aspiration Risk  Moderate aspiration risk    Diet Recommendation Dysphagia 2 (Fine chop);Thin liquid   Liquid Administration via: Cup;Straw Medication Administration: Crushed with puree Supervision: Full supervision/cueing for compensatory strategies Compensations: Slow rate;Small sips/bites (small frequent meals) Postural Changes: Seated upright at 90 degrees;Remain upright for at least 30 minutes after po intake    Other  Recommendations Oral Care Recommendations:  Oral care BID   Follow up Recommendations None      Frequency and Duration     N/a       Prognosis   N/a     Swallow Study   General HPI: Pt is an 86 yo female with h/o intellectual disability, prior COVID, hiatal hernia, wheelchair/bed found admitted to hospital with asthma  exacerbation.  Pt has undergone several prior MBS studies with most recent one 07/2015 showing penetration from large straw sips and mild vallecular residue that both cleared with an extra swallow. Dys 2 solids and thin liquids were recommended.  Imaging showed Large hiatal hernia with the entire stomach being intrathoracic.No gastric inflammation or obstruction. Hiatal hernia also contains the pancreas and portions of the splenic vein. The esophagus is fluid-filled and mildly dilated to the level of the thoracic inlet, suggesting reflux or dysmotility.   Per notes, the findings show significant change compared to A-P CT 03/22/2020.  She is currently on a dys2/thin diet in the hospital. No family present - her sister Leslie Chandler is listed as her guardian per Charity fundraiser, Hospital doctor.  From notes in hard chart, pt is a DNR at her facility - made RN aware of this - after which she informed MD.  Pt also with visit to [facility] after choking episode in January 2022 per review of chart - and CXR showed right lobe ATX. Type of Study: Bedside Swallow Evaluation Previous Swallow Assessment: see HPI Diet Prior to this Study: Dysphagia 2 (chopped);Thin liquids Temperature Spikes Noted: No Respiratory Status: Nasal cannula History of Recent Intubation: No Behavior/Cognition: Alert;Pleasant mood Oral Cavity Assessment: Within Functional Limits Oral Care Completed by SLP: No Oral Cavity - Dentition: Other (Comment);Missing dentition (few upper and lower dentition) Self-Feeding Abilities: Total assist Patient Positioning: Upright in bed Baseline Vocal Quality: Low vocal intensity Volitional Cough: Congested;Other (Comment) (not productive but effortful and not consistently associated with po intake) Volitional Swallow: Unable to elicit    Oral/Motor/Sensory Function Overall Oral Motor/Sensory Function: Other (comment) (minimal lingual deviation to right upon protrusion)   Ice Chips Ice chips: Not tested   Thin Liquid Thin Liquid: Within  functional limits Presentation: Straw    Nectar Thick Nectar Thick Liquid: Not tested   Honey Thick Honey Thick Liquid: Not tested   Puree Puree: Within functional limits Presentation: Spoon Other Comments: applesauce and ice cream- delayed cough x2   Solid     Solid: Impaired Oral Phase Functional Implications: Prolonged oral transit;Impaired mastication Pharyngeal Phase Impairments: Cough - Immediate Other Comments: with large bolus of graham cracker - concern for impaired oral transiting coordination - and potential premature spillage into pharynx; providing pt with minced food-placed in puree- with improve safety/cohesiveness      Chales Abrahams 04/11/2021,6:20 PM  Rolena Infante, MS [facility] SLP Acute Rehab Services Office [phone] Pager [phone]

## 2021-04-11 NOTE — Progress Notes (Signed)
Patient's sister (legal guardian) visited with patient today. Requesting update from MD when possible. Mobile # [phone]

## 2021-04-11 NOTE — Plan of Care (Signed)
  Problem: Clinical Measurements: Goal: Respiratory complications will improve Outcome: Progressing   Problem: Clinical Measurements: Goal: Cardiovascular complication will be avoided Outcome: Progressing   Problem: Pain Managment: Goal: General experience of comfort will improve Outcome: Progressing   

## 2021-04-11 NOTE — Progress Notes (Signed)
Patient's bilateral lower extremities reddened, with the left lower extremity very slow to blanch to nonblanchable.

## 2021-04-11 NOTE — Progress Notes (Signed)
RLE venous duplex has been completed.  Results can be found under chart review under CV PROC. 04/11/2021 4:49 PM Wake Conlee RVT, RDMS

## 2021-04-11 NOTE — TOC Initial Note (Signed)
Transition of Care [facility]) - Initial/Assessment Note   Patient Details  Name: Leslie Chandler MRN: [id number] Date of Birth: [birth year]  Transition of Care [facility]) CM/SW Contact:    Ewing Schlein, LCSW Phone Number: 04/11/2021, 11:15 AM  Clinical Narrative: Patient is an 86 year old female who was admitted for an asthma exacerbation. Patient resides at a group home, [facility], and her sister, June [name], is her legal guardian. CSW spoke with patient's sister to assess patient and determine possible discharge needs. Per sister, patient is in a wheelchair at baseline as she is not ambulatory and requires total assistance with ADLs. Sister provided CSW with the medical coordinator's name and phone number, Kaylyn Lim [phone]).  CSW spoke with Ms. Anderson. Per Ms. Anderson, the patient will need transportation back to the group via EMS due to a seizure disorder, intellectual disability, and being nonambulatory at baseline. Staff are usually in the group home after 3:30pm as the residents are at a day program, so patient will need to be set up with EMS to arrive after that time unless patient is discharging on the weekend. Patient will need an order for O2 if she is discharging with it. Ms. Dareen Piano also stated the patient will not need a new FL2 at discharge. TOC to follow.  Expected Discharge Plan: Group Home Barriers to Discharge: Continued Medical Work up  Patient Goals and CMS Choice Patient states their goals for this hospitalization and ongoing recovery are:: Return to group home CMS Medicare.gov Compare Post Acute Care list provided to:: Patient Represenative (must comment) Choice offered to / list presented to : [facility] POA / Guardian  Expected Discharge Plan and Services Expected Discharge Plan: Group Home In-house Referral: Clinical Social Work Post Acute Care Choice: NA Living arrangements for the past 2 months: Group Home           DME Arranged: N/A DME Agency: NA  Prior Living  Arrangements/Services Living arrangements for the past 2 months: Group Home Lives with:: Facility Resident Patient language and need for interpreter reviewed:: Yes Need for Family Participation in Patient Care: Yes (Comment) (Patient has an intellectual disability) Care giver support system in place?: Yes (comment) 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) Criminal Activity/Legal Involvement Pertinent to Current Situation/Hospitalization: No - Comment as needed  Activities of Daily Living Home Assistive Devices/Equipment: Other (Comment) (pt unable to verbalize) ADL Screening (condition at time of admission) Patient's cognitive ability adequate to safely complete daily activities?: No Is the patient deaf or have difficulty hearing?: No Does the patient have difficulty seeing, even when wearing glasses/contacts?: No Does the patient have difficulty concentrating, remembering, or making decisions?: Yes Patient able to express need for assistance with ADLs?: No Does the patient have difficulty dressing or bathing?: Yes Independently performs ADLs?: No Communication: Independent Dressing (OT): Needs assistance Is this a change from baseline?: Pre-admission baseline Grooming: Needs assistance Is this a change from baseline?: Pre-admission baseline Feeding: Needs assistance Is this a change from baseline?: Pre-admission baseline Bathing: Needs assistance Is this a change from baseline?: Pre-admission baseline Toileting: Needs assistance Is this a change from baseline?: Pre-admission baseline In/Out Bed: Needs assistance Is this a change from baseline?: Pre-admission baseline Walks in Home: Needs assistance Is this a change from baseline?: Pre-admission baseline Does the patient have difficulty walking or climbing stairs?: Yes (pt is sob) Weakness of Legs: Both Weakness of Arms/Hands: Both  Permission Sought/Granted Permission sought to share information with : Facility Games developer granted to share information  with : Yes, Verbal Permission Granted Share Information with NAME: Kaylyn Lim Permission granted to share info w Relationship: Medical coordinator for group home Permission granted to share info w Contact Information: [phone]  Emotional Assessment Orientation: : Oriented to Self Alcohol / Substance Use: Not Applicable Psych Involvement: No (comment)  Admission diagnosis:  Shortness of breath [R06.02] Asthma exacerbation [J45.901] Large hiatal hernia [K44.9] Patient Active Problem List   Diagnosis Date Noted   Asthma exacerbation 04/10/2021   Pneumonia due to COVID-19 virus 07/16/2019   Acute respiratory disease due to COVID-19 virus 07/15/2019   Seizure disorder (HCC) 07/15/2019   Hypothyroidism 07/15/2019   Hyperlipidemia 07/15/2019   Chronic diastolic heart failure (HCC) 08/08/2015   Aspiration pneumonia (HCC) 08/06/2015   Sepsis (HCC) 08/05/2015   Diarrhea 08/05/2015   Type 2 diabetes mellitus (HCC) 08/05/2015   Lactic acidosis 08/05/2015   CAP (community acquired pneumonia) 04/12/2014   Intellectual disability    Hypertension    Asthma    HCAP (healthcare-associated pneumonia) 04/11/2014   PCP:  Jordan Hawks, PA-C Pharmacy:   [facility] [place] - [address] [place] [address] Phone: [phone] Fax: [phone]  Readmission Risk Interventions No flowsheet data found.

## 2021-04-11 NOTE — Progress Notes (Signed)
PROGRESS NOTE    Leslie Chandler  WUJ:[id number] DOB: [birth year] DOA: 04/10/2021 PCP: Barbarann Ehlers    Brief Narrative:  86 y.o. female with medical history significant for cognitive impairment, intellectual disability, epilepsy, mild persistent asthma, dysphagia, chronic diastolic heart failure, hypertension, type 2 diabetes, CKD 3a and hypothyroidism who presents with concerns of increasing shortness of breath.  Assessment & Plan:   Principal Problem:   Asthma exacerbation Active Problems:   Intellectual disability   Hypertension   Type 2 diabetes mellitus (HCC)   Chronic diastolic heart failure (HCC)   Seizure disorder (HCC)   Hypothyroidism  Asthma exacerbation - Continue daily IV Solu-Medrol 40 mg - Scheduled albuterol nebulizer q6hr  - Still wheezing this AM. Cont with neb tx as tolerated   Large hiatal hernia/dysphagia - Comparing with previous CT approximately a year ago, she has had significant worsening hiatal hernia with entire stomach being intrathoracic along with portions of the pancreas and splenic vein -Will need discussion with family as to whether they would like to pursue aggressive interventions given her cognitive impairment and comorbidities -Pt has since been continued on dysphagia 2 diet   Right lower extremity edema - venous Doppler ultrasound reviewed, neg for DVT   Hypertension -Continue home Coreg, lisinopril-HCTZ -BP stable at this time   Type 2 diabetes -glucose elevated, likely related to current steroids -Cont SSI as needed   Hypothyroidism -Continue levothyroxine as needed   CKD stage IIIa - Labs reviewed, Cr stable   History of seizures -Continue home Keppra -Seizure free thus far   Chronic diastolic heart failure - Patient appears euvolemic   Intellectual disability - Patient resides in group home    DVT prophylaxis: Lovenox subq Code Status: DNR Family Communication: Pt in room, family not at bedside  Status is:  Inpatient  Remains inpatient appropriate because:Inpatient level of care appropriate due to severity of illness  Dispo: The patient is from: Group home              Anticipated d/c is to: Group home              Patient currently is not medically stable to d/c.   Difficult to place patient No       Consultants:    Procedures:    Antimicrobials: Anti-infectives (From admission, onward)    None       Subjective: Without complaints, however pt is wheezing  Objective: Vitals:   04/11/21 0818 04/11/21 1336 04/11/21 1402 04/11/21 1600  BP:   (!) 145/83   Pulse:   72   Resp:   14   Temp:   97.8 F (36.6 C)   TempSrc:   Oral   SpO2: 93% 96% (!) 80% 95%  Weight:      Height:        Intake/Output Summary (Last 24 hours) at 04/11/2021 1758 Last data filed at 04/11/2021 1612 Gross per 24 hour  Intake 60 ml  Output 175 ml  Net -115 ml   Filed Weights   04/11/21 0207  Weight: 80 kg    Examination: General exam: Awake, laying in bed, in nad Respiratory system: Normal respiratory effort, audible wheezing without need for stethoscope Cardiovascular system: regular rate, s1, s2 Gastrointestinal system: Soft, nondistended, positive BS Central nervous system: CN2-12 grossly intact, strength intact Extremities: Perfused, no clubbing Skin: Normal skin turgor, no notable skin lesions seen Psychiatry: Mood normal // no visual hallucinations   Data Reviewed: I have personally reviewed following  labs and imaging studies  CBC: Recent Labs  Lab 04/10/21 1510  WBC 8.0  NEUTROABS 5.0  HGB 12.0  HCT 38.2  MCV 91.8  PLT 152   Basic Metabolic Panel: Recent Labs  Lab 04/10/21 1510  NA 145  K 4.4  CL 104  CO2 33*  GLUCOSE 98  BUN 36*  CREATININE 0.88  CALCIUM 9.9   GFR: Estimated Creatinine Clearance: 48.5 mL/min (by C-G formula based on SCr of 0.88 mg/dL). Liver Function Tests: Recent Labs  Lab 04/10/21 1510  AST 21  ALT 20  ALKPHOS 90  BILITOT 0.6   PROT 7.5  ALBUMIN 3.9   No results for input(s): LIPASE, AMYLASE in the last 168 hours. No results for input(s): AMMONIA in the last 168 hours. Coagulation Profile: No results for input(s): INR, PROTIME in the last 168 hours. Cardiac Enzymes: No results for input(s): CKTOTAL, CKMB, CKMBINDEX, TROPONINI in the last 168 hours. BNP (last 3 results) No results for input(s): PROBNP in the last 8760 hours. HbA1C: No results for input(s): HGBA1C in the last 72 hours. CBG: Recent Labs  Lab 04/11/21 1225 04/11/21 1629  GLUCAP 200* 199*   Lipid Profile: No results for input(s): CHOL, HDL, LDLCALC, TRIG, CHOLHDL, LDLDIRECT in the last 72 hours. Thyroid Function Tests: No results for input(s): TSH, T4TOTAL, FREET4, T3FREE, THYROIDAB in the last 72 hours. Anemia Panel: No results for input(s): VITAMINB12, FOLATE, FERRITIN, TIBC, IRON, RETICCTPCT in the last 72 hours. Sepsis Labs: No results for input(s): PROCALCITON, LATICACIDVEN in the last 168 hours.  Recent Results (from the past 240 hour(s))  SARS CORONAVIRUS 2 (TAT 6-24 HRS) Nasopharyngeal Nasopharyngeal Swab     Status: None   Collection Time: 04/10/21  8:41 PM   Specimen: Nasopharyngeal Swab  Result Value Ref Range Status   SARS Coronavirus 2 NEGATIVE NEGATIVE Final    Comment: (NOTE) SARS-CoV-2 target nucleic acids are NOT DETECTED.  The SARS-CoV-2 RNA is generally detectable in upper and lower respiratory specimens during the acute phase of infection. Negative results do not preclude SARS-CoV-2 infection, do not rule out co-infections with other pathogens, and should not be used as the sole basis for treatment or other patient management decisions. Negative results must be combined with clinical observations, patient history, and epidemiological information. The expected result is Negative.  Fact Sheet for Patients: HairSlick.no  Fact Sheet for Healthcare  Providers: quierodirigir.com  This test is not yet approved or cleared by the [place] FDA and  has been authorized for detection and/or diagnosis of SARS-CoV-2 by FDA under an Emergency Use Authorization (EUA). This EUA will remain  in effect (meaning this test can be used) for the duration of the COVID-19 declaration under Se ction 564(b)(1) of the Act, 21 U.S.C. section 360bbb-3(b)(1), unless the authorization is terminated or revoked sooner.  Performed at [facility], [address], [place], [place] [address]      Radiology Studies: CT Chest W Contrast  Result Date: 04/10/2021 CLINICAL DATA:  86 year old with shortness of breath. Abnormal x-ray. EXAM: CT CHEST WITH CONTRAST TECHNIQUE: Multidetector CT imaging of the chest was performed during intravenous contrast administration. CONTRAST:  53mL OMNIPAQUE IOHEXOL 350 MG/ML SOLN COMPARISON:  Chest radiograph earlier today.  Chest CT 04/11/2014 FINDINGS: Cardiovascular: Aortic atherosclerosis and tortuosity. Exam not tailored for pulmonary artery evaluation, no obvious central pulmonary embolus. Borderline cardiomegaly. No pericardial effusion. There are coronary artery calcifications. Mediastinum/Nodes: Large hiatal hernia with the entire stomach being intrathoracic. Hiatal hernia also contains the  pancreas and portions of the splenic vein. The esophagus is fluid-filled and mildly dilated to the level of the thoracic inlet. No thyroid nodule. Scattered small mediastinal and hilar lymph nodes, not enlarged by size criteria. Lungs/Pleura: Breathing motion artifact limits detailed assessment. Compressive atelectasis in the lung bases related to large hiatal hernia. Heterogeneous pulmonary parenchyma. Tiny subpleural nodules in the left upper lobe, series 7, image 38 these are present on prior exam from 2015 and considered benign. There is no suspicious pulmonary nodule. Linear atelectasis in the periphery of the  left upper lobe which may account for radiographic appearance. Motion obscures tracheobronchial evaluation,. No pleural fluid. Upper Abdomen: Entire stomach is intrathoracic. More than half of the pancreas extends into hiatal hernia and is intrathoracic. Multiple intraluminal gallstones. Suspected hepatic steatosis. No acute upper abdominal findings. Musculoskeletal: Multiple remote right rib fractures. Side exaggerated thoracic kyphosis. Vertebral body hemangioma within T4. Macrolobulated left breast calcification is stable from prior. Additional scattered calcifications within both breasts. No suspicious chest wall abnormality. IMPRESSION: 1. Large hiatal hernia with the entire stomach being intrathoracic. No gastric inflammation or obstruction. Hiatal hernia also contains the pancreas and portions of the splenic vein. The esophagus is fluid-filled and mildly dilated to the level of the thoracic inlet, suggesting reflux or dysmotility. 2. Linear atelectasis in the periphery of the left upper lobe may account for radiographic appearance. No suspicious pulmonary nodule. 3. Mild cardiomegaly.  Aortic atherosclerosis. 4. Heterogeneous pulmonary parenchyma, can be seen with small vessel or small airways disease. 5. Cholelithiasis incidentally noted in the upper abdomen. Aortic Atherosclerosis (ICD10-I70.0). Electronically Signed   By: Narda Rutherford M.D.   On: 04/10/2021 17:32   DG Chest Port 1 View  Result Date: 04/10/2021 CLINICAL DATA:  Cough, wheezing and shortness of breath in an 86 year old female. EXAM: PORTABLE CHEST 1 VIEW COMPARISON:  July 15, 2019. FINDINGS: EKG leads project over the chest. Heart size remains markedly enlarged. Signs of central pulmonary vascular engorgement and increased interstitial markings. No lobar consolidative process. Density along the LEFT pleural surface at the site of previous peripheral opacity measures up to 13 mm. This persists with other areas of patchy density seen  on the prior study largely resolved. Signs of hiatal hernia in the retrocardiac region. On limited assessment no acute skeletal process. IMPRESSION: Marked cardiomegaly with signs of potential early CHF/volume overload. No lobar consolidation. Potential scarring or nodularity along the LEFT lateral chest this patient with previous multifocal pneumonia. Given persistence suggest PA and lateral chest radiograph on follow-up when the patient is able or chest CT for further evaluation. Signs of hiatal hernia in the retrocardiac region. Electronically Signed   By: Donzetta Kohut M.D.   On: 04/10/2021 16:02   VAS [place] LOWER EXTREMITY VENOUS (DVT)  Result Date: 04/11/2021  Lower Venous DVT Study Patient Name:  ANNABEL GIBEAU  Date of Exam:   04/11/2021 Medical Rec #: [id number]   Accession #:    [id number] Date of Birth: 10/22/1934   Patient Gender: F Patient Age:   51 years Exam Location:  [facility] Procedure:      VAS [place] LOWER EXTREMITY VENOUS (DVT) Referring Phys: CHING TU --------------------------------------------------------------------------------  Indications: Edema.  Limitations: Poor ultrasound/tissue interface and patient inability to cooperate. Comparison Study: No previous exams Performing Technologist: Jody Hill RVT, RDMS  Examination Guidelines: A complete evaluation includes B-mode imaging, spectral Doppler, color Doppler, and power Doppler as needed of all accessible portions of each vessel. Bilateral testing is considered an integral part  of a complete examination. Limited examinations for reoccurring indications may be performed as noted. The reflux portion of the exam is performed with the patient in reverse Trendelenburg.  +---------+---------------+---------+-----------+----------+--------------+ RIGHT    CompressibilityPhasicitySpontaneityPropertiesThrombus Aging +---------+---------------+---------+-----------+----------+--------------+ CFV      Full           Yes      Yes                                  +---------+---------------+---------+-----------+----------+--------------+ SFJ      Full                                                        +---------+---------------+---------+-----------+----------+--------------+ FV Prox  Full           Yes      Yes                                 +---------+---------------+---------+-----------+----------+--------------+ FV Mid   Full           Yes      Yes                                 +---------+---------------+---------+-----------+----------+--------------+ FV DistalFull           Yes      Yes                                 +---------+---------------+---------+-----------+----------+--------------+ PFV      Full                                                        +---------+---------------+---------+-----------+----------+--------------+ POP      Full           Yes      Yes                                 +---------+---------------+---------+-----------+----------+--------------+ PTV      Full                                                        +---------+---------------+---------+-----------+----------+--------------+ PERO     Full                                                        +---------+---------------+---------+-----------+----------+--------------+   +----+---------------+---------+-----------+----------+--------------+ LEFTCompressibilityPhasicitySpontaneityPropertiesThrombus Aging +----+---------------+---------+-----------+----------+--------------+ CFV Full           Yes      Yes                                 +----+---------------+---------+-----------+----------+--------------+  Summary: RIGHT: - There is no evidence of deep vein thrombosis in the lower extremity. - There is no evidence of superficial venous thrombosis.  - No cystic structure found in the popliteal fossa.  LEFT: - No evidence of common femoral vein obstruction.  *See table(s)  above for measurements and observations.    Preliminary     Scheduled Meds:  albuterol  2.5 mg Nebulization Q6H   aspirin  81 mg Oral Daily   carvedilol  3.125 mg Oral BID WC   enoxaparin (LOVENOX) injection  40 mg Subcutaneous Q24H   ferrous sulfate  174 mg Oral QPM   lisinopril  10 mg Oral Daily   And   hydrochlorothiazide  12.5 mg Oral Daily   insulin aspart  0-15 Units Subcutaneous TID WC   insulin aspart  0-5 Units Subcutaneous QHS   levETIRAcetam  250 mg Oral BID   levothyroxine  88 mcg Oral Q0600   methylPREDNISolone (SOLU-MEDROL) injection  40 mg Intravenous Daily   montelukast  10 mg Oral QPM   pravastatin  20 mg Oral QPM   Continuous Infusions:   LOS: 0 days   Rickey Barbara, MD [facility] Pager On Amion  If 7PM-7AM, please contact night-coverage 04/11/2021, 5:58 PM

## 2021-04-11 NOTE — H&P (Signed)
History and Physical    Leslie Chandler XBJ:[id number] DOB: 04-16-1935 DOA: 04/10/2021  PCP: Jordan Hawks, PA-C  Patient coming from: Group home  I have personally briefly reviewed patient's old medical records in [facility]  Chief Complaint: shortness of breath  HPI: Leslie Chandler is a 86 y.o. female with medical history significant for cognitive impairment, intellectual disability, epilepsy, mild persistent asthma, dysphagia, chronic diastolic heart failure, hypertension, type 2 diabetes, CKD 3a and hypothyroidism who presents with concerns of increasing shortness of breath.  Patient unable to provide history due to cognitive impairment.  No family at bedside.  History obtained entirely from ED documentation.  Reportedly patient presented to urgent care today for evaluation of shortness of breath for the past few days.  Patient was unable to stand for chest x-ray at urgent care and was sent to ED for further evaluation.  She was treated with albuterol nebulizer x2 and 125 mg Solu-Medrol by EMS.  Reportedly she tested negative for COVID on 8/22.  ED Course: She was afebrile and intermittently hypertensive up to 160s and placed on 1 L with no documented hypoxia.  CBC without any leukocytosis or anemia.  BMP unremarkable.  CT chest with contrast was obtained which showed a large hiatal hernia with the entire stomach being intrathoracic with also portions of the pancreas and splenic vein.  Review of Systems: Unable to fully obtain given patient's cognitive impairment  Past Medical History:  Diagnosis Date   Asthma    Cognitive developmental delay    Epilepsia [facility])    Hypertension    Mental retardation    Osteoporosis     Past Surgical History:  Procedure Laterality Date   BREAST RECONSTRUCTION     DENTAL SURGERY     REDUCTION MAMMAPLASTY Bilateral      reports that she has never smoked. She has never used smokeless tobacco. She reports that she does not drink alcohol and  does not use drugs. Social History  No Known Allergies  Family History  Problem Relation Age of Onset   Cancer Mother      Prior to Admission medications   Medication Sig Start Date End Date Taking? Authorizing Provider  acetaminophen (TYLENOL) 500 MG tablet Take 500 mg by mouth every 6 (six) hours as needed for mild pain or headache.   Yes [provider]  aspirin 81 MG chewable tablet Chew 81 mg by mouth daily.    Yes [provider]  Bismuth Subsalicylate (PEPTO-BISMOL MAX STRENGTH) 525 MG/15ML SUSP Take 5-15 mLs by mouth daily as needed (for indigestion).   Yes [provider]  calamine lotion Apply 1 application topically daily as needed for itching.   Yes [provider]  Calcium Carbonate-Vitamin D (HCA LIQUID CALCIUM/VITAMIN D PO) Take 3 mLs by mouth daily.   Yes [provider]  Calcium-Vitamin D-Vitamin K (VIACTIV PO) Take 1 tablet by mouth daily. Chew 1 tablet by mouth in the morning   Yes [provider]  carvedilol (COREG) 3.125 MG tablet Take 1 tablet (3.125 mg total) by mouth 2 (two) times daily with a meal. 08/08/15  Yes Hollice Espy, MD  dimenhyDRINATE (DRAMAMINE) 50 MG tablet Take 50 mg by mouth every 8 (eight) hours as needed for dizziness or nausea (as directed).   Yes [provider]  diphenhydrAMINE (BENADRYL) 25 MG tablet Take 25 mg by mouth daily as needed for itching (as directed).   Yes [provider]  DM-Phenylephrine-Acetaminophen (SUDAFED PE PRESSURE+PAIN+COUGH PO)  Take 5 mLs by mouth every 6 (six) hours as needed (for symptoms).   Yes [provider]  famotidine (PEPCID) 10 MG tablet Take 10 mg by mouth daily as needed for heartburn or indigestion.   Yes [provider]  ferrous sulfate 220 (44 FE) MG/5ML solution Take 176 mg by mouth every evening.    Yes [provider]  fluticasone (FLOVENT HFA) 110 MCG/ACT inhaler Inhale 2 puffs into the lungs 2 (two)  times daily.   Yes [provider]  guaiFENesin-dextromethorphan (ROBITUSSIN DM) 100-10 MG/5ML syrup Take 10 mLs by mouth every 6 (six) hours as needed for cough. 07/20/19  Yes Arrien, York Ram, MD  hydrocortisone cream 1 % Apply 1 application topically daily as needed for itching.   Yes [provider]  ibuprofen (ADVIL) 200 MG tablet Take 200 mg by mouth every 6 (six) hours as needed for headache or mild pain.   Yes [provider]  levETIRAcetam (KEPPRA) 100 MG/ML solution Take 2.5 mLs by mouth 2 (two) times daily. 06/28/19  Yes [provider]  levothyroxine (SYNTHROID, LEVOTHROID) 88 MCG tablet Take 88 mcg by mouth daily at 12 noon.    Yes [provider]  lisinopril-hydrochlorothiazide (PRINZIDE,ZESTORETIC) 10-12.5 MG per tablet Take 1 tablet by mouth daily.   Yes [provider]  loperamide (IMODIUM A-D) 2 MG tablet Take 2 mg by mouth 4 (four) times daily as needed for diarrhea or loose stools.   Yes [provider]  Magnesium Hydroxide (MILK OF MAGNESIA CONCENTRATE) 2400 MG/10ML SUSP Take 5 mLs by mouth daily as needed (constipation).   Yes [provider]  metFORMIN (GLUCOPHAGE) 500 MG tablet Take 500 mg by mouth daily.   Yes [provider]  montelukast (SINGULAIR) 10 MG tablet Take 10 mg by mouth every evening.    Yes [provider]  Neomycin-Bacitracin-Polymyxin (FIRST AID ANTIBIOTIC) [phone] MG-UNIT OINT Apply 1 application topically daily as needed (as directed- to affected sites).   Yes [provider]  Nutritional Supplements (ENSURE ENLIVE PO) Take 237 mLs by mouth 3 (three) times daily.   Yes [provider]  Ophthalmic Irrigation Solution (EYE WASH) SOLN Place 1 drop into both eyes daily as needed (irritation).   Yes [provider]  pravastatin (PRAVACHOL) 20 MG tablet Take 20 mg by mouth every evening.  06/28/19  Yes [provider]  Skin  Protectants, Misc. (ALOE VESTA PROTECTIVE) OINT Apply 1 application topically daily as needed (as directed- to affected sites).   Yes [provider]  SUNSCREENS EX Apply 1 application topically daily as needed (for skin protection).   Yes [provider]  potassium chloride 20 MEQ/15ML (10%) SOLN Take 7.5 mLs (10 mEq total) by mouth daily for 5 days. Patient not taking: No sig reported 07/25/19 03/22/20  Alveria Apley, PA-C    Physical Exam: Vitals:   04/10/21 1830 04/10/21 1900 04/10/21 2151 04/10/21 2242  BP: 111/90 (!) 153/101 (!) 157/92   Pulse: 89 75 87   Resp: (!) 25 19 16    Temp:   97.7 F (36.5 C)   TempSrc:   Oral   SpO2: 96% 92% 98% 96%    Constitutional: NAD, calm, comfortable, nontoxic elderly female laying at approximately 20 degree incline Vitals:   04/10/21 1830 04/10/21 1900 04/10/21 2151 04/10/21 2242  BP: 111/90 (!) 153/101 (!) 157/92   Pulse: 89 75 87   Resp: (!) 25 19 16    Temp:   97.7 F (36.5 C)  TempSrc:   Oral   SpO2: 96% 92% 98% 96%   Eyes: PERRL, lids and conjunctivae normal ENMT: Mucous membranes are moist.  Neck: normal, supple Respiratory: clear to auscultation bilaterally, no wheezing, no crackles. Normal respiratory effort. No accessory muscle use.  Cardiovascular: Regular rate and rhythm, no murmurs / rubs / gallops.  +3 pitting edema of the distal right lower extremity. Abdomen: no tenderness, no masses palpated.  Bowel sounds positive.  Musculoskeletal: no clubbing / cyanosis. No joint deformity upper and lower extremities. Good ROM, no contractures. Normal muscle tone.  Skin: no rashes, lesions, ulcers. No induration Neurologic: Patient alert but unable to answer any questions.  She was able to say yes when asked her name was Leslie Chandler.  Not able to follow commands but able to move all extremities. Psychiatric: Unable to assess due to intellectual disability    Labs on Admission: I have personally reviewed following labs and  imaging studies  CBC: Recent Labs  Lab 04/10/21 1510  WBC 8.0  NEUTROABS 5.0  HGB 12.0  HCT 38.2  MCV 91.8  PLT 152   Basic Metabolic Panel: Recent Labs  Lab 04/10/21 1510  NA 145  K 4.4  CL 104  CO2 33*  GLUCOSE 98  BUN 36*  CREATININE 0.88  CALCIUM 9.9   GFR: CrCl cannot be calculated (Unknown ideal weight.). Liver Function Tests: Recent Labs  Lab 04/10/21 1510  AST 21  ALT 20  ALKPHOS 90  BILITOT 0.6  PROT 7.5  ALBUMIN 3.9   No results for input(s): LIPASE, AMYLASE in the last 168 hours. No results for input(s): AMMONIA in the last 168 hours. Coagulation Profile: No results for input(s): INR, PROTIME in the last 168 hours. Cardiac Enzymes: No results for input(s): CKTOTAL, CKMB, CKMBINDEX, TROPONINI in the last 168 hours. BNP (last 3 results) No results for input(s): PROBNP in the last 8760 hours. HbA1C: No results for input(s): HGBA1C in the last 72 hours. CBG: No results for input(s): GLUCAP in the last 168 hours. Lipid Profile: No results for input(s): CHOL, HDL, LDLCALC, TRIG, CHOLHDL, LDLDIRECT in the last 72 hours. Thyroid Function Tests: No results for input(s): TSH, T4TOTAL, FREET4, T3FREE, THYROIDAB in the last 72 hours. Anemia Panel: No results for input(s): VITAMINB12, FOLATE, FERRITIN, TIBC, IRON, RETICCTPCT in the last 72 hours. Urine analysis:    Component Value Date/Time   COLORURINE YELLOW 03/22/2020 1044   APPEARANCEUR CLEAR 03/22/2020 1044   LABSPEC 1.018 03/22/2020 1044   PHURINE 7.0 03/22/2020 1044   GLUCOSEU 150 (A) 03/22/2020 1044   HGBUR NEGATIVE 03/22/2020 1044   BILIRUBINUR NEGATIVE 03/22/2020 1044   KETONESUR NEGATIVE 03/22/2020 1044   PROTEINUR NEGATIVE 03/22/2020 1044   NITRITE NEGATIVE 03/22/2020 1044   LEUKOCYTESUR NEGATIVE 03/22/2020 1044    Radiological Exams on Admission: CT Chest W Contrast  Result Date: 04/10/2021 CLINICAL DATA:  86 year old with shortness of breath. Abnormal x-ray. EXAM: CT CHEST WITH  CONTRAST TECHNIQUE: Multidetector CT imaging of the chest was performed during intravenous contrast administration. CONTRAST:  51mL OMNIPAQUE IOHEXOL 350 MG/ML SOLN COMPARISON:  Chest radiograph earlier today.  Chest CT 04/11/2014 FINDINGS: Cardiovascular: Aortic atherosclerosis and tortuosity. Exam not tailored for pulmonary artery evaluation, no obvious central pulmonary embolus. Borderline cardiomegaly. No pericardial effusion. There are coronary artery calcifications. Mediastinum/Nodes: Large hiatal hernia with the entire stomach being intrathoracic. Hiatal hernia also contains the pancreas and portions of the splenic vein. The esophagus is fluid-filled and mildly dilated to the level of the thoracic inlet. No  thyroid nodule. Scattered small mediastinal and hilar lymph nodes, not enlarged by size criteria. Lungs/Pleura: Breathing motion artifact limits detailed assessment. Compressive atelectasis in the lung bases related to large hiatal hernia. Heterogeneous pulmonary parenchyma. Tiny subpleural nodules in the left upper lobe, series 7, image 38 these are present on prior exam from 2015 and considered benign. There is no suspicious pulmonary nodule. Linear atelectasis in the periphery of the left upper lobe which may account for radiographic appearance. Motion obscures tracheobronchial evaluation,. No pleural fluid. Upper Abdomen: Entire stomach is intrathoracic. More than half of the pancreas extends into hiatal hernia and is intrathoracic. Multiple intraluminal gallstones. Suspected hepatic steatosis. No acute upper abdominal findings. Musculoskeletal: Multiple remote right rib fractures. Side exaggerated thoracic kyphosis. Vertebral body hemangioma within T4. Macrolobulated left breast calcification is stable from prior. Additional scattered calcifications within both breasts. No suspicious chest wall abnormality. IMPRESSION: 1. Large hiatal hernia with the entire stomach being intrathoracic. No gastric  inflammation or obstruction. Hiatal hernia also contains the pancreas and portions of the splenic vein. The esophagus is fluid-filled and mildly dilated to the level of the thoracic inlet, suggesting reflux or dysmotility. 2. Linear atelectasis in the periphery of the left upper lobe may account for radiographic appearance. No suspicious pulmonary nodule. 3. Mild cardiomegaly.  Aortic atherosclerosis. 4. Heterogeneous pulmonary parenchyma, can be seen with small vessel or small airways disease. 5. Cholelithiasis incidentally noted in the upper abdomen. Aortic Atherosclerosis (ICD10-I70.0). Electronically Signed   By: Narda RutherfordMelanie  Sanford M.D.   On: 04/10/2021 17:32   DG Chest Port 1 View  Result Date: 04/10/2021 CLINICAL DATA:  Cough, wheezing and shortness of breath in an 86 year old female. EXAM: PORTABLE CHEST 1 VIEW COMPARISON:  July 15, 2019. FINDINGS: EKG leads project over the chest. Heart size remains markedly enlarged. Signs of central pulmonary vascular engorgement and increased interstitial markings. No lobar consolidative process. Density along the LEFT pleural surface at the site of previous peripheral opacity measures up to 13 mm. This persists with other areas of patchy density seen on the prior study largely resolved. Signs of hiatal hernia in the retrocardiac region. On limited assessment no acute skeletal process. IMPRESSION: Marked cardiomegaly with signs of potential early CHF/volume overload. No lobar consolidation. Potential scarring or nodularity along the LEFT lateral chest this patient with previous multifocal pneumonia. Given persistence suggest PA and lateral chest radiograph on follow-up when the patient is able or chest CT for further evaluation. Signs of hiatal hernia in the retrocardiac region. Electronically Signed   By: Donzetta KohutGeoffrey  Wile M.D.   On: 04/10/2021 16:02      Assessment/Plan  Asthma exacerbation - Continue daily IV Solu-Medrol 40 mg - Scheduled albuterol nebulizer  q6hr  - Give one dose of Mg  Large hiatal hernia/dysphagia - Comparing with previous CT approximately a year ago, she has had significant worsening hiatal hernia with entire stomach being intrathoracic along with portions of the pancreas and splenic vein -Will need discussion with family as to whether they would like to pursue aggressive interventions given her cognitive impairment and comorbidities -Placed on dysphagia 2 diet.  Obtain speech eval  Right lower extremity edema - Obtain venous Doppler ultrasound  Hypertension -Continue home Coreg, lisinopril-HCTZ  Type 2 diabetes -controlled. Monitor with repeat lab in the morning  Hypothyroidism -Continue levothyroxine  CKD stage IIIa - Creatinine stable  History of seizures -Continue home Keppra  Chronic diastolic heart failure - Patient appears euvolemic  Intellectual disability - Patient resides in group home  DVT prophylaxis:.Lovenox Code Status: Full Family Communication:No family at bedside  disposition Plan: Home with observation Consults called:  Admission status: Observation  Level of care: Med-Surg  Status is: Observation  The patient remains OBS appropriate and will d/c before 2 midnights.  Dispo: The patient is from: Group home              Anticipated d/c is to: Group home              Patient currently is not medically stable to d/c.   Difficult to place patient No         Anselm Jungling DO [facility]   If 7PM-7AM, please contact night-coverage [email or web address]   04/11/2021, 1:50 AM

## 2021-04-12 LAB — CBC
HCT: 38.1 % (ref 36.0–46.0)
Hemoglobin: 12.3 g/dL (ref 12.0–15.0)
MCH: 29.1 pg (ref 26.0–34.0)
MCHC: 32.3 g/dL (ref 30.0–36.0)
MCV: 90.3 fL (ref 80.0–100.0)
Platelets: 135 10*3/uL — ABNORMAL LOW (ref 150–400)
RBC: 4.22 MIL/uL (ref 3.87–5.11)
RDW: 14.6 % (ref 11.5–15.5)
WBC: 14.4 10*3/uL — ABNORMAL HIGH (ref 4.0–10.5)
nRBC: 0 % (ref 0.0–0.2)

## 2021-04-12 LAB — COMPREHENSIVE METABOLIC PANEL
ALT: 20 U/L (ref 0–44)
AST: 26 U/L (ref 15–41)
Albumin: 3.5 g/dL (ref 3.5–5.0)
Alkaline Phosphatase: 73 U/L (ref 38–126)
Anion gap: 10 (ref 5–15)
BUN: 37 mg/dL — ABNORMAL HIGH (ref 8–23)
CO2: 27 mmol/L (ref 22–32)
Calcium: 8.9 mg/dL (ref 8.9–10.3)
Chloride: 102 mmol/L (ref 98–111)
Creatinine, Ser: 1.08 mg/dL — ABNORMAL HIGH (ref 0.44–1.00)
GFR, Estimated: 51 mL/min — ABNORMAL LOW (ref >60)
Glucose, Bld: 166 mg/dL — ABNORMAL HIGH (ref 70–99)
Potassium: 4.1 mmol/L (ref 3.5–5.1)
Sodium: 139 mmol/L (ref 135–145)
Total Bilirubin: 0.6 mg/dL (ref 0.3–1.2)
Total Protein: 6.8 g/dL (ref 6.5–8.1)

## 2021-04-12 LAB — GLUCOSE, CAPILLARY
Glucose-Capillary: 153 mg/dL — ABNORMAL HIGH (ref 70–99)
Glucose-Capillary: 134 mg/dL — ABNORMAL HIGH (ref 70–99)
Glucose-Capillary: 178 mg/dL — ABNORMAL HIGH (ref 70–99)
Glucose-Capillary: 147 mg/dL — ABNORMAL HIGH (ref 70–99)

## 2021-04-12 NOTE — Progress Notes (Signed)
PROGRESS NOTE    Gaylynn Seiple  ZOX:[id number] DOB: [birth year] DOA: 04/10/2021 PCP: Barbarann Ehlers    Brief Narrative:  86 y.o. female with medical history significant for cognitive impairment, intellectual disability, epilepsy, mild persistent asthma, dysphagia, chronic diastolic heart failure, hypertension, type 2 diabetes, CKD 3a and hypothyroidism who presents with concerns of increasing shortness of breath.  Assessment & Plan:   Principal Problem:   Asthma exacerbation Active Problems:   Intellectual disability   Hypertension   Type 2 diabetes mellitus (HCC)   Chronic diastolic heart failure (HCC)   Seizure disorder (HCC)   Hypothyroidism  Asthma exacerbation - Continue daily IV Solu-Medrol 40 mg - Scheduled albuterol nebulizer q6hr  - Cont to wheeze this AM, however seems to be noticeable when pt is awake, clear when asleep   Large hiatal hernia/dysphagia - Comparing with previous CT approximately a year ago, she has had significant worsening hiatal hernia with entire stomach being intrathoracic along with portions of the pancreas and splenic vein -Will need discussion with family as to whether they would like to pursue aggressive interventions given her cognitive impairment and comorbidities -Pt has since been continued on dysphagia 2 diet   Right lower extremity edema - venous Doppler ultrasound reviewed, neg for DVT   Hypertension -Continue home Coreg, lisinopril-HCTZ -BP stable at this time   Type 2 diabetes -glucose elevated, likely related to current steroids -Cont SSI as needed   Hypothyroidism -Continue levothyroxine as needed   CKD stage IIIa - Labs reviewed, Cr stable   History of seizures -Continue home Keppra -Seizure free currently   Chronic diastolic heart failure - Patient appears euvolemic   Intellectual disability - Patient resides in group home    DVT prophylaxis: Lovenox subq Code Status: DNR Family Communication: Pt in room,  family not at bedside  Status is: Inpatient  Remains inpatient appropriate because:Inpatient level of care appropriate due to severity of illness  Dispo: The patient is from: Group home              Anticipated d/c is to: Group home              Patient currently is not medically stable to d/c.   Difficult to place patient No       Consultants:    Procedures:    Antimicrobials: Anti-infectives (From admission, onward)    None       Subjective: Not speaking much. Still audible wheeze when awake, not heard when pt asleep again  Objective: Vitals:   04/12/21 0730 04/12/21 0800 04/12/21 1341 04/12/21 1355  BP:   (!) 153/93   Pulse:   70   Resp:   16   Temp:   (!) 97.5 F (36.4 C)   TempSrc:   Oral   SpO2: 98% 96% 92% 96%  Weight:      Height:        Intake/Output Summary (Last 24 hours) at 04/12/2021 1805 Last data filed at 04/12/2021 1410 Gross per 24 hour  Intake 1200 ml  Output 1100 ml  Net 100 ml    Filed Weights   04/11/21 0207  Weight: 80 kg    Examination: General exam: Conversant, in no acute distress Respiratory system: normal chest rise, clear, no audible wheezing Cardiovascular system: regular rhythm, s1-s2 Gastrointestinal system: Nondistended, nontender, pos BS Central nervous system: No seizures, no tremors Extremities: No cyanosis, no joint deformities Skin: No rashes, no pallor Psychiatry: Affect normal // no auditory hallucinations  Data Reviewed: I have personally reviewed following labs and imaging studies  CBC: Recent Labs  Lab 04/10/21 1510 04/12/21 0339  WBC 8.0 14.4*  NEUTROABS 5.0  --   HGB 12.0 12.3  HCT 38.2 38.1  MCV 91.8 90.3  PLT 152 135*    Basic Metabolic Panel: Recent Labs  Lab 04/10/21 1510 04/12/21 0339  NA 145 139  K 4.4 4.1  CL 104 102  CO2 33* 27  GLUCOSE 98 166*  BUN 36* 37*  CREATININE 0.88 1.08*  CALCIUM 9.9 8.9    GFR: Estimated Creatinine Clearance: 39.5 mL/min (A) (by C-G formula  based on SCr of 1.08 mg/dL (H)). Liver Function Tests: Recent Labs  Lab 04/10/21 1510 04/12/21 0339  AST 21 26  ALT 20 20  ALKPHOS 90 73  BILITOT 0.6 0.6  PROT 7.5 6.8  ALBUMIN 3.9 3.5    No results for input(s): LIPASE, AMYLASE in the last 168 hours. No results for input(s): AMMONIA in the last 168 hours. Coagulation Profile: No results for input(s): INR, PROTIME in the last 168 hours. Cardiac Enzymes: No results for input(s): CKTOTAL, CKMB, CKMBINDEX, TROPONINI in the last 168 hours. BNP (last 3 results) No results for input(s): PROBNP in the last 8760 hours. HbA1C: No results for input(s): HGBA1C in the last 72 hours. CBG: Recent Labs  Lab 04/11/21 1629 04/11/21 2139 04/12/21 0737 04/12/21 1132 04/12/21 1643  GLUCAP 199* 183* 153* 134* 178*    Lipid Profile: No results for input(s): CHOL, HDL, LDLCALC, TRIG, CHOLHDL, LDLDIRECT in the last 72 hours. Thyroid Function Tests: No results for input(s): TSH, T4TOTAL, FREET4, T3FREE, THYROIDAB in the last 72 hours. Anemia Panel: No results for input(s): VITAMINB12, FOLATE, FERRITIN, TIBC, IRON, RETICCTPCT in the last 72 hours. Sepsis Labs: No results for input(s): PROCALCITON, LATICACIDVEN in the last 168 hours.  Recent Results (from the past 240 hour(s))  SARS CORONAVIRUS 2 (TAT 6-24 HRS) Nasopharyngeal Nasopharyngeal Swab     Status: None   Collection Time: 04/10/21  8:41 PM   Specimen: Nasopharyngeal Swab  Result Value Ref Range Status   SARS Coronavirus 2 NEGATIVE NEGATIVE Final    Comment: (NOTE) SARS-CoV-2 target nucleic acids are NOT DETECTED.  The SARS-CoV-2 RNA is generally detectable in upper and lower respiratory specimens during the acute phase of infection. Negative results do not preclude SARS-CoV-2 infection, do not rule out co-infections with other pathogens, and should not be used as the sole basis for treatment or other patient management decisions. Negative results must be combined with clinical  observations, patient history, and epidemiological information. The expected result is Negative.  Fact Sheet for Patients: HairSlick.no  Fact Sheet for Healthcare Providers: quierodirigir.com  This test is not yet approved or cleared by the [place] FDA and  has been authorized for detection and/or diagnosis of SARS-CoV-2 by FDA under an Emergency Use Authorization (EUA). This EUA will remain  in effect (meaning this test can be used) for the duration of the COVID-19 declaration under Se ction 564(b)(1) of the Act, 21 U.S.C. section 360bbb-3(b)(1), unless the authorization is terminated or revoked sooner.  Performed at [facility], [address] [place], [place] [address]       Radiology Studies: VAS [place] LOWER EXTREMITY VENOUS (DVT)  Result Date: 04/11/2021  Lower Venous DVT Study Patient Name:  MARIEN MANSHIP  Date of Exam:   04/11/2021 Medical Rec #: [id number]   Accession #:    [id number] Date of Birth: Mar 20, 1935   Patient  Gender: F Patient Age:   8 years Exam Location:  [facility] Procedure:      VAS [place] LOWER EXTREMITY VENOUS (DVT) Referring Phys: CHING TU --------------------------------------------------------------------------------  Indications: Edema.  Limitations: Poor ultrasound/tissue interface and patient inability to cooperate. Comparison Study: No previous exams Performing Technologist: Jody Hill RVT, RDMS  Examination Guidelines: A complete evaluation includes B-mode imaging, spectral Doppler, color Doppler, and power Doppler as needed of all accessible portions of each vessel. Bilateral testing is considered an integral part of a complete examination. Limited examinations for reoccurring indications may be performed as noted. The reflux portion of the exam is performed with the patient in reverse Trendelenburg.  +---------+---------------+---------+-----------+----------+--------------+ RIGHT     CompressibilityPhasicitySpontaneityPropertiesThrombus Aging +---------+---------------+---------+-----------+----------+--------------+ CFV      Full           Yes      Yes                                 +---------+---------------+---------+-----------+----------+--------------+ SFJ      Full                                                        +---------+---------------+---------+-----------+----------+--------------+ FV Prox  Full           Yes      Yes                                 +---------+---------------+---------+-----------+----------+--------------+ FV Mid   Full           Yes      Yes                                 +---------+---------------+---------+-----------+----------+--------------+ FV DistalFull           Yes      Yes                                 +---------+---------------+---------+-----------+----------+--------------+ PFV      Full                                                        +---------+---------------+---------+-----------+----------+--------------+ POP      Full           Yes      Yes                                 +---------+---------------+---------+-----------+----------+--------------+ PTV      Full                                                        +---------+---------------+---------+-----------+----------+--------------+ PERO     Full                                                        +---------+---------------+---------+-----------+----------+--------------+   +----+---------------+---------+-----------+----------+--------------+  LEFTCompressibilityPhasicitySpontaneityPropertiesThrombus Aging +----+---------------+---------+-----------+----------+--------------+ CFV Full           Yes      Yes                                 +----+---------------+---------+-----------+----------+--------------+     Summary: RIGHT: - There is no evidence of deep vein thrombosis in the lower  extremity. - There is no evidence of superficial venous thrombosis.  - No cystic structure found in the popliteal fossa.  LEFT: - No evidence of common femoral vein obstruction.  *See table(s) above for measurements and observations. Electronically signed by Heath Larkhomas Hawken on 04/11/2021 at 5:58:38 PM.    Final     Scheduled Meds:  albuterol  2.5 mg Nebulization Q6H   aspirin  81 mg Oral Daily   carvedilol  3.125 mg Oral BID WC   enoxaparin (LOVENOX) injection  40 mg Subcutaneous Q24H   ferrous sulfate  174 mg Oral QPM   lisinopril  10 mg Oral Daily   And   hydrochlorothiazide  12.5 mg Oral Daily   insulin aspart  0-15 Units Subcutaneous TID WC   insulin aspart  0-5 Units Subcutaneous QHS   levETIRAcetam  250 mg Oral BID   levothyroxine  88 mcg Oral Q0600   methylPREDNISolone (SOLU-MEDROL) injection  40 mg Intravenous Daily   montelukast  10 mg Oral QPM   pantoprazole (PROTONIX) IV  40 mg Intravenous Daily   pravastatin  20 mg Oral QPM   Continuous Infusions:   LOS: 1 day   Rickey BarbaraStephen Wyley Hack, MD [facility] Pager On Amion  If 7PM-7AM, please contact night-coverage 04/12/2021, 6:05 PM

## 2021-04-12 NOTE — TOC Progression Note (Signed)
Transition of Care [facility]) - Progression Note   Patient Details  Name: Leslie Chandler MRN: [id number] Date of Birth: Dec 04, 1934  [facility]) CM/SW Contact  Ewing Schlein, LCSW Phone Number: 04/12/2021, 2:51 PM  Clinical Narrative: Patient is still on 2L/min O2. CSW followed up with Ms. Anderson, the medical coordinator for the group home, regarding the possibility of the patient discharging on O2. Per Ms. Anderson, the group home will need a hard copy of a physician's order for O2 in order for it to be administered to the patient in the group home per facility standards. TOC to follow.  Expected Discharge Plan: Group Home Barriers to Discharge: Continued Medical Work up  Expected Discharge Plan and Services Expected Discharge Plan: Group Home In-house Referral: Clinical Social Work Post Acute Care Choice: NA Living arrangements for the past 2 months: Group Home DME Arranged: N/A DME Agency: NA  Readmission Risk Interventions No flowsheet data found.

## 2021-04-13 LAB — COMPREHENSIVE METABOLIC PANEL
ALT: 23 U/L (ref 0–44)
AST: 40 U/L (ref 15–41)
Albumin: 3.6 g/dL (ref 3.5–5.0)
Alkaline Phosphatase: 77 U/L (ref 38–126)
Anion gap: 7 (ref 5–15)
BUN: 39 mg/dL — ABNORMAL HIGH (ref 8–23)
CO2: 29 mmol/L (ref 22–32)
Calcium: 8.4 mg/dL — ABNORMAL LOW (ref 8.9–10.3)
Chloride: 102 mmol/L (ref 98–111)
Creatinine, Ser: 0.97 mg/dL (ref 0.44–1.00)
GFR, Estimated: 58 mL/min — ABNORMAL LOW (ref >60)
Glucose, Bld: 124 mg/dL — ABNORMAL HIGH (ref 70–99)
Potassium: 4.4 mmol/L (ref 3.5–5.1)
Sodium: 138 mmol/L (ref 135–145)
Total Bilirubin: 1.2 mg/dL (ref 0.3–1.2)
Total Protein: 7.1 g/dL (ref 6.5–8.1)

## 2021-04-13 LAB — CBC
HCT: 35.6 % — ABNORMAL LOW (ref 36.0–46.0)
Hemoglobin: 11.6 g/dL — ABNORMAL LOW (ref 12.0–15.0)
MCH: 29.1 pg (ref 26.0–34.0)
MCHC: 32.6 g/dL (ref 30.0–36.0)
MCV: 89.4 fL (ref 80.0–100.0)
Platelets: 149 10*3/uL — ABNORMAL LOW (ref 150–400)
RBC: 3.98 MIL/uL (ref 3.87–5.11)
RDW: 14.4 % (ref 11.5–15.5)
WBC: 11.3 10*3/uL — ABNORMAL HIGH (ref 4.0–10.5)
nRBC: 0 % (ref 0.0–0.2)

## 2021-04-13 LAB — GLUCOSE, CAPILLARY
Glucose-Capillary: 118 mg/dL — ABNORMAL HIGH (ref 70–99)
Glucose-Capillary: 165 mg/dL — ABNORMAL HIGH (ref 70–99)
Glucose-Capillary: 167 mg/dL — ABNORMAL HIGH (ref 70–99)
Glucose-Capillary: 230 mg/dL — ABNORMAL HIGH (ref 70–99)

## 2021-04-13 MED ORDER — STERILE WATER FOR INJECTION IJ SOLN
INTRAMUSCULAR | Status: AC
  Administered 2021-04-13: 10 mL
  Filled 2021-04-13: qty 10

## 2021-04-13 NOTE — Progress Notes (Addendum)
Pt continues to eat small, frequent meals due to her hiatal hernia. She does well overall with these when she takes her time. Pt continues to desat on 02 1-2 L [place] as well to about 85-86 percent with bed movement (turning etc.). Overall pt has improved with her work of breathing. Pt remains stable. Rn will continue to monitor. Nursing staff also working with patient and pillow placement to maintain good breathing position for pt.

## 2021-04-13 NOTE — Progress Notes (Signed)
PROGRESS NOTE    Leslie Chandler  WUJ:[id number] DOB: 01/21/1935 DOA: 04/10/2021 PCP: Barbarann Ehlers    Brief Narrative:  86 y.o. female with medical history significant for cognitive impairment, intellectual disability, epilepsy, mild persistent asthma, dysphagia, chronic diastolic heart failure, hypertension, type 2 diabetes, CKD 3a and hypothyroidism who presents with concerns of increasing shortness of breath.  Assessment & Plan:   Principal Problem:   Asthma exacerbation Active Problems:   Intellectual disability   Hypertension   Type 2 diabetes mellitus (HCC)   Chronic diastolic heart failure (HCC)   Seizure disorder (HCC)   Hypothyroidism  Asthma exacerbation - Continued on daily IV Solu-Medrol 40 mg - Continue with scheduled albuterol nebulizer q6hr  - Breathing seems improved today, less wheezing   Large hiatal hernia/dysphagia - Comparing with previous CT approximately a year ago, she has had significant worsening hiatal hernia with entire stomach being intrathoracic along with portions of the pancreas and splenic vein -Pt appears comfortable in bed, RN states pt is tolerating diet   Right lower extremity edema - venous Doppler ultrasound reviewed, neg for DVT   Hypertension -Continue home Coreg, lisinopril-HCTZ -BP stable at this time -Cont to titrate regimen as needed   Type 2 diabetes -glucose elevated, likely related to current steroids -Continue with SSI coverage as needed   Hypothyroidism -Continue levothyroxine as pt needs   CKD stage IIIa - Labs reviewed, Cr stable   History of seizures -Continue home Keppra -Remains seizure free   Chronic diastolic heart failure - Patient appears euvolemic   Intellectual disability - Patient resides in group home   DVT prophylaxis: Lovenox subq Code Status: DNR Family Communication: Pt in room, family currently not at bedside  Status is: Inpatient  Remains inpatient appropriate because:Inpatient  level of care appropriate due to severity of illness  Dispo: The patient is from: Group home              Anticipated d/c is to: Group home              Patient currently is not medically stable to d/c.   Difficult to place patient No   Consultants:    Procedures:    Antimicrobials: Anti-infectives (From admission, onward)    None       Subjective: Not very conversant, however visibly breathing better.  Objective: Vitals:   04/13/21 0141 04/13/21 0537 04/13/21 0721 04/13/21 1352  BP:  (!) 153/81  (!) 141/77  Pulse:  63  (!) 59  Resp:  19  17  Temp:  98.7 F (37.1 C)    TempSrc:  Oral    SpO2: 99% 98% 92% 99%  Weight:      Height:        Intake/Output Summary (Last 24 hours) at 04/13/2021 1425 Last data filed at 04/13/2021 1419 Gross per 24 hour  Intake 1080 ml  Output 1000 ml  Net 80 ml    Filed Weights   04/11/21 0207  Weight: 80 kg    Examination: General exam: Awake, laying in bed, in nad Respiratory system: Normal respiratory effort, mild wheezing that has improved from prior Cardiovascular system: regular rate, s1, s2 Gastrointestinal system: Soft, nondistended, positive BS Central nervous system: CN2-12 grossly intact, strength intact Extremities: Perfused, no clubbing Skin: Normal skin turgor, no notable skin lesions seen Psychiatry: Mood normal // no visual hallucinations   Data Reviewed: I have personally reviewed following labs and imaging studies  CBC: Recent Labs  Lab 04/10/21 1510  04/12/21 0339 04/13/21 0348  WBC 8.0 14.4* 11.3*  NEUTROABS 5.0  --   --   HGB 12.0 12.3 11.6*  HCT 38.2 38.1 35.6*  MCV 91.8 90.3 89.4  PLT 152 135* 149*    Basic Metabolic Panel: Recent Labs  Lab 04/10/21 1510 04/12/21 0339 04/13/21 0348  NA 145 139 138  K 4.4 4.1 4.4  CL 104 102 102  CO2 33* 27 29  GLUCOSE 98 166* 124*  BUN 36* 37* 39*  CREATININE 0.88 1.08* 0.97  CALCIUM 9.9 8.9 8.4*    GFR: Estimated Creatinine Clearance: 44 mL/min  (by C-G formula based on SCr of 0.97 mg/dL). Liver Function Tests: Recent Labs  Lab 04/10/21 1510 04/12/21 0339 04/13/21 0348  AST 21 26 40  ALT 20 20 23   ALKPHOS 90 73 77  BILITOT 0.6 0.6 1.2  PROT 7.5 6.8 7.1  ALBUMIN 3.9 3.5 3.6    No results for input(s): LIPASE, AMYLASE in the last 168 hours. No results for input(s): AMMONIA in the last 168 hours. Coagulation Profile: No results for input(s): INR, PROTIME in the last 168 hours. Cardiac Enzymes: No results for input(s): CKTOTAL, CKMB, CKMBINDEX, TROPONINI in the last 168 hours. BNP (last 3 results) No results for input(s): PROBNP in the last 8760 hours. HbA1C: No results for input(s): HGBA1C in the last 72 hours. CBG: Recent Labs  Lab 04/12/21 1132 04/12/21 1643 04/12/21 2123 04/13/21 0751 04/13/21 1133  GLUCAP 134* 178* 147* 118* 165*    Lipid Profile: No results for input(s): CHOL, HDL, LDLCALC, TRIG, CHOLHDL, LDLDIRECT in the last 72 hours. Thyroid Function Tests: No results for input(s): TSH, T4TOTAL, FREET4, T3FREE, THYROIDAB in the last 72 hours. Anemia Panel: No results for input(s): VITAMINB12, FOLATE, FERRITIN, TIBC, IRON, RETICCTPCT in the last 72 hours. Sepsis Labs: No results for input(s): PROCALCITON, LATICACIDVEN in the last 168 hours.  Recent Results (from the past 240 hour(s))  SARS CORONAVIRUS 2 (TAT 6-24 HRS) Nasopharyngeal Nasopharyngeal Swab     Status: None   Collection Time: 04/10/21  8:41 PM   Specimen: Nasopharyngeal Swab  Result Value Ref Range Status   SARS Coronavirus 2 NEGATIVE NEGATIVE Final    Comment: (NOTE) SARS-CoV-2 target nucleic acids are NOT DETECTED.  The SARS-CoV-2 RNA is generally detectable in upper and lower respiratory specimens during the acute phase of infection. Negative results do not preclude SARS-CoV-2 infection, do not rule out co-infections with other pathogens, and should not be used as the sole basis for treatment or other patient management  decisions. Negative results must be combined with clinical observations, patient history, and epidemiological information. The expected result is Negative.  Fact Sheet for Patients: 04/12/21  Fact Sheet for Healthcare Providers: HairSlick.no  This test is not yet approved or cleared by the [place] FDA and  has been authorized for detection and/or diagnosis of SARS-CoV-2 by FDA under an Emergency Use Authorization (EUA). This EUA will remain  in effect (meaning this test can be used) for the duration of the COVID-19 declaration under Se ction 564(b)(1) of the Act, 21 U.S.C. section 360bbb-3(b)(1), unless the authorization is terminated or revoked sooner.  Performed at [facility], [address], [place], [place]       Radiology Studies: VAS [id number] LOWER EXTREMITY VENOUS (DVT)  Result Date: 04/11/2021  Lower Venous DVT Study Patient Name:  Leslie Chandler  Date of Exam:   04/11/2021 Medical Rec #: 04/13/2021   Accession #:    [id number]  Date of Birth: 04/08/1935   Patient Gender: F Patient Age:   [age] years [facility] Procedure:      VAS [place] LOWER EXTREMITY VENOUS (DVT) Referring Phys: CHING TU --------------------------------------------------------------------------------  Indications: Edema.  Limitations: Poor ultrasound/tissue interface and patient inability to cooperate. Comparison Study: No previous exams Performing Technologist: Jody Hill RVT, RDMS  Examination Guidelines: A complete evaluation includes B-mode imaging, spectral Doppler, color Doppler, and power Doppler as needed of all accessible portions of each vessel. Bilateral testing is considered an integral part of a complete examination. Limited examinations for reoccurring indications may be performed as noted. The reflux portion of the exam is performed with the patient in reverse Trendelenburg.   +---------+---------------+---------+-----------+----------+--------------+ RIGHT    CompressibilityPhasicitySpontaneityPropertiesThrombus Aging +---------+---------------+---------+-----------+----------+--------------+ CFV      Full           Yes      Yes                                 +---------+---------------+---------+-----------+----------+--------------+ SFJ      Full                                                        +---------+---------------+---------+-----------+----------+--------------+ FV Prox  Full           Yes      Yes                                 +---------+---------------+---------+-----------+----------+--------------+ FV Mid   Full           Yes      Yes                                 +---------+---------------+---------+-----------+----------+--------------+ FV DistalFull           Yes      Yes                                 +---------+---------------+---------+-----------+----------+--------------+ PFV      Full                                                        +---------+---------------+---------+-----------+----------+--------------+ POP      Full           Yes      Yes                                 +---------+---------------+---------+-----------+----------+--------------+ PTV      Full                                                        +---------+---------------+---------+-----------+----------+--------------+ PERO  Full                                                        +---------+---------------+---------+-----------+----------+--------------+   +----+---------------+---------+-----------+----------+--------------+ LEFTCompressibilityPhasicitySpontaneityPropertiesThrombus Aging +----+---------------+---------+-----------+----------+--------------+ CFV Full           Yes      Yes                                  +----+---------------+---------+-----------+----------+--------------+     Summary: RIGHT: - There is no evidence of deep vein thrombosis in the lower extremity. - There is no evidence of superficial venous thrombosis.  - No cystic structure found in the popliteal fossa.  LEFT: - No evidence of common femoral vein obstruction.  *See table(s) above for measurements and observations. Electronically signed by Heath Lark on 04/11/2021 at 5:58:38 PM.    Final     Scheduled Meds:  albuterol  2.5 mg Nebulization Q6H   aspirin  81 mg Oral Daily   carvedilol  3.125 mg Oral BID WC   enoxaparin (LOVENOX) injection  40 mg Subcutaneous Q24H   ferrous sulfate  174 mg Oral QPM   lisinopril  10 mg Oral Daily   And   hydrochlorothiazide  12.5 mg Oral Daily   insulin aspart  0-15 Units Subcutaneous TID WC   insulin aspart  0-5 Units Subcutaneous QHS   levETIRAcetam  250 mg Oral BID   levothyroxine  88 mcg Oral Q0600   methylPREDNISolone (SOLU-MEDROL) injection  40 mg Intravenous Daily   montelukast  10 mg Oral QPM   pantoprazole (PROTONIX) IV  40 mg Intravenous Daily   pravastatin  20 mg Oral QPM   Continuous Infusions:   LOS: 2 days   Rickey Barbara, MD [facility] Pager On Amion  If 7PM-7AM, please contact night-coverage 04/13/2021, 2:25 PM

## 2021-04-13 NOTE — Plan of Care (Signed)
  Problem: Coping: Goal: Level of anxiety will decrease Outcome: Progressing   Problem: Pain Managment: Goal: General experience of comfort will improve Outcome: Progressing   Problem: Safety: Goal: Ability to remain free from injury will improve Outcome: Progressing   

## 2021-04-14 DIAGNOSIS — R0602 Shortness of breath: Secondary | ICD-10-CM

## 2021-04-14 LAB — GLUCOSE, CAPILLARY
Glucose-Capillary: 97 mg/dL (ref 70–99)
Glucose-Capillary: 195 mg/dL — ABNORMAL HIGH (ref 70–99)
Glucose-Capillary: 189 mg/dL — ABNORMAL HIGH (ref 70–99)
Glucose-Capillary: 158 mg/dL — ABNORMAL HIGH (ref 70–99)

## 2021-04-14 MED ORDER — FUROSEMIDE 10 MG/ML IJ SOLN
40.0000 mg | Freq: Once | INTRAMUSCULAR | Status: AC
  Administered 2021-04-14: 40 mg via INTRAVENOUS
  Filled 2021-04-14: qty 4

## 2021-04-14 MED ORDER — STERILE WATER FOR INJECTION IJ SOLN
INTRAMUSCULAR | Status: AC
  Administered 2021-04-14: 10 mL
  Filled 2021-04-14: qty 10

## 2021-04-14 MED ORDER — ALBUTEROL SULFATE (2.5 MG/3ML) 0.083% IN NEBU
2.5000 mg | INHALATION_SOLUTION | Freq: Two times a day (BID) | RESPIRATORY_TRACT | Status: DC
  Administered 2021-04-15 – 2021-04-16 (×4): 2.5 mg via RESPIRATORY_TRACT
  Filled 2021-04-14 (×5): qty 3

## 2021-04-14 MED ORDER — FUROSEMIDE 10 MG/ML IJ SOLN
40.0000 mg | Freq: Two times a day (BID) | INTRAMUSCULAR | Status: DC
  Administered 2021-04-14: 40 mg via INTRAVENOUS
  Filled 2021-04-14: qty 4

## 2021-04-14 MED ORDER — METHYLPREDNISOLONE SODIUM SUCC 40 MG IJ SOLR
40.0000 mg | INTRAMUSCULAR | Status: DC
  Administered 2021-04-15 – 2021-04-16 (×2): 40 mg via INTRAVENOUS
  Filled 2021-04-14 (×2): qty 1

## 2021-04-14 NOTE — Plan of Care (Signed)
  Problem: Clinical Measurements: Goal: Diagnostic test results will improve Outcome: Progressing   Problem: Clinical Measurements: Goal: Respiratory complications will improve Outcome: Progressing   Problem: Clinical Measurements: Goal: Cardiovascular complication will be avoided Outcome: Progressing   Problem: Elimination: Goal: Will not experience complications related to bowel motility Outcome: Progressing   Problem: Elimination: Goal: Will not experience complications related to urinary retention Outcome: Progressing   

## 2021-04-14 NOTE — Plan of Care (Signed)
  Problem: Coping: Goal: Level of anxiety will decrease Outcome: Progressing   Problem: Pain Managment: Goal: General experience of comfort will improve Outcome: Progressing   Problem: Safety: Goal: Ability to remain free from injury will improve Outcome: Progressing   

## 2021-04-14 NOTE — Progress Notes (Signed)
PROGRESS NOTE    Leslie Chandler  [id number]:[id number] DOB: 15-Dec-1934 DOA: 04/10/2021 PCP: Barbarann Ehlers    Brief Narrative:  86 y.o. female with medical history significant for cognitive impairment, intellectual disability, epilepsy, mild persistent asthma, dysphagia, chronic diastolic heart failure, hypertension, type 2 diabetes, CKD 3a and hypothyroidism who presents with concerns of increasing shortness of breath.  Assessment & Plan:   Principal Problem:   Asthma exacerbation Active Problems:   Intellectual disability   Hypertension   Type 2 diabetes mellitus (HCC)   Chronic diastolic heart failure (HCC)   Seizure disorder (HCC)   Hypothyroidism  Asthma exacerbation with acute hypoxemic respiratory failure present on admission - Continued on daily IV Solu-Medrol 40 mg - Continue with scheduled albuterol nebulizer q6hr  -Improvement with trial of Lasix per below - Breathing seems improved today, less wheezing -Continue to wean O2 as tolerated   Large hiatal hernia/dysphagia - Comparing with previous CT approximately a year ago, she has had significant worsening hiatal hernia with entire stomach being intrathoracic along with portions of the pancreas and splenic vein -Pt appears comfortable in bed, RN states pt is tolerating diet   Right lower extremity edema - venous Doppler ultrasound reviewed, neg for DVT   Hypertension -Had been continued on home Coreg, lisinopril-HCTZ -BP stable at this time -As patient is now receiving Lasix, will discontinue hydrochlorothiazide   Type 2 diabetes -glucose elevated, likely related to current steroids -Continue with SSI coverage as needed   Hypothyroidism -Continue levothyroxine as pt needs -Given concerns of acute heart failure exacerbation, will check TSH   CKD stage IIIa - Labs reviewed, Cr stable   History of seizures -Continue home Keppra -Remains seizure free   Acute on chronic diastolic heart failure - Initial  imaging suggests volume overload -Patient has responded well to trial of IV Lasix, will continue -Follow daily weights and strict I's and O's   Intellectual disability - Patient resides in group home   DVT prophylaxis: Lovenox subq Code Status: DNR Family Communication: Pt in room, family currently not at bedside  Status is: Inpatient  Remains inpatient appropriate because:Inpatient level of care appropriate due to severity of illness  Dispo: The patient is from: Group home              Anticipated d/c is to: Group home              Patient currently is not medically stable to d/c.   Difficult to place patient No   Consultants:    Procedures:    Antimicrobials: Anti-infectives (From admission, onward)    None       Subjective: Pleasant, not very conversant  Objective: Vitals:   04/14/21 0256 04/14/21 0519 04/14/21 0816 04/14/21 1429  BP:  121/66  (!) 147/81  Pulse:  66  (!) 54  Resp:  16  18  Temp:  98.4 F (36.9 C)  97.9 F (36.6 C)  TempSrc:  Oral  Oral  SpO2: 93% 95% 94% 99%  Weight:      Height:        Intake/Output Summary (Last 24 hours) at 04/14/2021 1559 Last data filed at 04/14/2021 1257 Gross per 24 hour  Intake 1620 ml  Output 1500 ml  Net 120 ml    Filed Weights   04/11/21 0207  Weight: 80 kg    Examination: General exam: Awake, laying in bed, in nad Respiratory system: Slightly increased respiratory effort, audible wheezing on exam Cardiovascular system: regular rate,  s1, s2 Gastrointestinal system: Soft, nondistended, positive BS Central nervous system: CN2-12 grossly intact, strength intact Extremities: Perfused, no clubbing Skin: Normal skin turgor, no notable skin lesions seen Psychiatry: Mood normal // no visual hallucinations   Data Reviewed: I have personally reviewed following labs and imaging studies  CBC: Recent Labs  Lab 04/10/21 1510 04/12/21 0339 04/13/21 0348  WBC 8.0 14.4* 11.3*  NEUTROABS 5.0  --   --    HGB 12.0 12.3 11.6*  HCT 38.2 38.1 35.6*  MCV 91.8 90.3 89.4  PLT 152 135* 149*    Basic Metabolic Panel: Recent Labs  Lab 04/10/21 1510 04/12/21 0339 04/13/21 0348  NA 145 139 138  K 4.4 4.1 4.4  CL 104 102 102  CO2 33* 27 29  GLUCOSE 98 166* 124*  BUN 36* 37* 39*  CREATININE 0.88 1.08* 0.97  CALCIUM 9.9 8.9 8.4*    GFR: Estimated Creatinine Clearance: 44 mL/min (by C-G formula based on SCr of 0.97 mg/dL). Liver Function Tests: Recent Labs  Lab 04/10/21 1510 04/12/21 0339 04/13/21 0348  AST 21 26 40  ALT 20 20 23   ALKPHOS 90 73 77  BILITOT 0.6 0.6 1.2  PROT 7.5 6.8 7.1  ALBUMIN 3.9 3.5 3.6    No results for input(s): LIPASE, AMYLASE in the last 168 hours. No results for input(s): AMMONIA in the last 168 hours. Coagulation Profile: No results for input(s): INR, PROTIME in the last 168 hours. Cardiac Enzymes: No results for input(s): CKTOTAL, CKMB, CKMBINDEX, TROPONINI in the last 168 hours. BNP (last 3 results) No results for input(s): PROBNP in the last 8760 hours. HbA1C: No results for input(s): HGBA1C in the last 72 hours. CBG: Recent Labs  Lab 04/13/21 1133 04/13/21 1612 04/13/21 2139 04/14/21 0734 04/14/21 1225  GLUCAP 165* 167* 230* 97 195*    Lipid Profile: No results for input(s): CHOL, HDL, LDLCALC, TRIG, CHOLHDL, LDLDIRECT in the last 72 hours. Thyroid Function Tests: No results for input(s): TSH, T4TOTAL, FREET4, T3FREE, THYROIDAB in the last 72 hours. Anemia Panel: No results for input(s): VITAMINB12, FOLATE, FERRITIN, TIBC, IRON, RETICCTPCT in the last 72 hours. Sepsis Labs: No results for input(s): PROCALCITON, LATICACIDVEN in the last 168 hours.  Recent Results (from the past 240 hour(s))  SARS CORONAVIRUS 2 (TAT 6-24 HRS) Nasopharyngeal Nasopharyngeal Swab     Status: None   Collection Time: 04/10/21  8:41 PM   Specimen: Nasopharyngeal Swab  Result Value Ref Range Status   SARS Coronavirus 2 NEGATIVE NEGATIVE Final     Comment: (NOTE) SARS-CoV-2 target nucleic acids are NOT DETECTED.  The SARS-CoV-2 RNA is generally detectable in upper and lower respiratory specimens during the acute phase of infection. Negative results do not preclude SARS-CoV-2 infection, do not rule out co-infections with other pathogens, and should not be used as the sole basis for treatment or other patient management decisions. Negative results must be combined with clinical observations, patient history, and epidemiological information. The expected result is Negative.  Fact Sheet for Patients: 04/12/21  Fact Sheet for Healthcare Providers: [employer]  This test is not yet approved or cleared by the [employer] FDA and  has been authorized for detection and/or diagnosis of SARS-CoV-2 by FDA under an Emergency Use Authorization (EUA). This EUA will remain  in effect (meaning this test can be used) for the duration of the COVID-19 declaration under Se ction 564(b)(1) of the Act, 21 U.S.C. section 360bbb-3(b)(1), unless the authorization is terminated or revoked sooner.  Performed at [facility]  [facility], [address], [place], [place] [address]       Radiology Studies: No results found.  Scheduled Meds:  albuterol  2.5 mg Nebulization BID   aspirin  81 mg Oral Daily   carvedilol  3.125 mg Oral BID WC   enoxaparin (LOVENOX) injection  40 mg Subcutaneous Q24H   ferrous sulfate  174 mg Oral QPM   lisinopril  10 mg Oral Daily   And   hydrochlorothiazide  12.5 mg Oral Daily   insulin aspart  0-15 Units Subcutaneous TID WC   insulin aspart  0-5 Units Subcutaneous QHS   levETIRAcetam  250 mg Oral BID   levothyroxine  88 mcg Oral Q0600   methylPREDNISolone (SOLU-MEDROL) injection  40 mg Intravenous Daily   montelukast  10 mg Oral QPM   pantoprazole (PROTONIX) IV  40 mg Intravenous Daily   pravastatin  20 mg Oral QPM   Continuous Infusions:   LOS: 3  days   Rickey Barbara, MD [facility] Pager On Amion  If 7PM-7AM, please contact night-coverage 04/14/2021, 3:59 PM

## 2021-04-15 LAB — COMPREHENSIVE METABOLIC PANEL
ALT: 69 U/L — ABNORMAL HIGH (ref 0–44)
AST: 73 U/L — ABNORMAL HIGH (ref 15–41)
Albumin: 3.7 g/dL (ref 3.5–5.0)
Alkaline Phosphatase: 71 U/L (ref 38–126)
Anion gap: 11 (ref 5–15)
BUN: 43 mg/dL — ABNORMAL HIGH (ref 8–23)
CO2: 29 mmol/L (ref 22–32)
Calcium: 8.3 mg/dL — ABNORMAL LOW (ref 8.9–10.3)
Chloride: 98 mmol/L (ref 98–111)
Creatinine, Ser: 1.29 mg/dL — ABNORMAL HIGH (ref 0.44–1.00)
GFR, Estimated: 41 mL/min — ABNORMAL LOW (ref >60)
Glucose, Bld: 120 mg/dL — ABNORMAL HIGH (ref 70–99)
Potassium: 3.7 mmol/L (ref 3.5–5.1)
Sodium: 138 mmol/L (ref 135–145)
Total Bilirubin: 0.9 mg/dL (ref 0.3–1.2)
Total Protein: 7 g/dL (ref 6.5–8.1)

## 2021-04-15 LAB — GLUCOSE, CAPILLARY
Glucose-Capillary: 111 mg/dL — ABNORMAL HIGH (ref 70–99)
Glucose-Capillary: 156 mg/dL — ABNORMAL HIGH (ref 70–99)
Glucose-Capillary: 200 mg/dL — ABNORMAL HIGH (ref 70–99)
Glucose-Capillary: 244 mg/dL — ABNORMAL HIGH (ref 70–99)

## 2021-04-15 LAB — TSH: TSH: 0.955 u[IU]/mL (ref 0.350–4.500)

## 2021-04-15 MED ORDER — PANTOPRAZOLE SODIUM 40 MG PO PACK
40.0000 mg | PACK | Freq: Every day | ORAL | Status: DC
  Administered 2021-04-16: 40 mg
  Filled 2021-04-15: qty 20

## 2021-04-15 NOTE — Plan of Care (Signed)

## 2021-04-15 NOTE — Progress Notes (Signed)
PROGRESS NOTE    Leslie Chandler  [id number] DOB: 06/28/1935 DOA: 04/10/2021 PCP: Barbarann Ehlers    Brief Narrative:  86 y.o. female with medical history significant for cognitive impairment, intellectual disability, epilepsy, mild persistent asthma, dysphagia, chronic diastolic heart failure, hypertension, type 2 diabetes, CKD 3a and hypothyroidism who presents with concerns of increasing shortness of breath.  Assessment & Plan:   Principal Problem:   Asthma exacerbation Active Problems:   Intellectual disability   Hypertension   Type 2 diabetes mellitus (HCC)   Chronic diastolic heart failure (HCC)   Seizure disorder (HCC)   Hypothyroidism  Asthma exacerbation with acute hypoxemic respiratory failure present on admission - Continued on daily IV Solu-Medrol 40 mg - Continue with scheduled albuterol nebulizer q6hr  -Improvement was noted with trial of Lasix over the weekend -Patient continues to be O2 dependent.  Patient's facility reports historically patient did require O2 in the past -Lasix now on hold secondary to acute renal failure   Large hiatal hernia/dysphagia - Comparing with previous CT approximately a year ago, she has had significant worsening hiatal hernia with entire stomach being intrathoracic along with portions of the pancreas and splenic vein -Pt appears comfortable in bed, RN states pt is tolerating diet   Right lower extremity edema - venous Doppler ultrasound reviewed, neg for DVT   Hypertension -Had been continued on home Coreg, lisinopril-HCTZ -BP stable at this time -Patient now status post course of Lasix   Type 2 diabetes -glucose elevated, likely related to current steroids -Continue with SSI coverage as needed   Hypothyroidism -Continue levothyroxine as pt needs -Given concerns of acute heart failure exacerbation, will check TSH   CKD stage IIIa - Labs reviewed, Cr stable   History of seizures -Continue home Keppra -Remains  seizure free   Acute on chronic diastolic heart failure - Initial imaging suggests volume overload -Patient responded well to IV Lasix over the weekend -Lasix now on hold secondary to development of acute renal failure likely secondary to diuresis   Intellectual disability - Patient resides in group home  Acute renal failure -Creatinine up to 1.29 from 0.97 -Likely secondary to recent diuresis -Lasix now on hold -Repeat basic metabolic panel in the morning   DVT prophylaxis: Lovenox subq Code Status: DNR Family Communication: Pt in room, family currently not at bedside  Status is: Inpatient  Remains inpatient appropriate because:Inpatient level of care appropriate due to severity of illness  Dispo: The patient is from: Group home              Anticipated d/c is to: Group home              Patient currently is not medically stable to d/c.   Difficult to place patient No   Consultants:    Procedures:    Antimicrobials: Anti-infectives (From admission, onward)    None       Subjective: More awake today, not very conversant  Objective: Vitals:   04/15/21 0952 04/15/21 0957 04/15/21 1331 04/15/21 1717  BP:   (!) 129/59   Pulse:  67 (!) 54 63  Resp:   16   Temp:   98.9 F (37.2 C)   TempSrc:   Oral   SpO2: (!) 86% 96% 96% 96%  Weight:      Height:        Intake/Output Summary (Last 24 hours) at 04/15/2021 1838 Last data filed at 04/15/2021 1426 Gross per 24 hour  Intake 540 ml  Output 900 ml  Net -360 ml    Filed Weights   04/11/21 0207 04/15/21 0500  Weight: 80 kg 78 kg    Examination: General exam: Conversant, in no acute distress Respiratory system: normal chest rise, clear, no audible wheezing Cardiovascular system: regular rhythm, s1-s2 Gastrointestinal system: Nondistended, nontender, pos BS Central nervous system: No seizures, no tremors Extremities: No cyanosis, no joint deformities Skin: No rashes, no pallor Psychiatry: Affect normal  // no auditory hallucinations   Data Reviewed: I have personally reviewed following labs and imaging studies  CBC: Recent Labs  Lab 04/10/21 1510 04/12/21 0339 04/13/21 0348  WBC 8.0 14.4* 11.3*  NEUTROABS 5.0  --   --   HGB 12.0 12.3 11.6*  HCT 38.2 38.1 35.6*  MCV 91.8 90.3 89.4  PLT 152 135* 149*    Basic Metabolic Panel: Recent Labs  Lab 04/10/21 1510 04/12/21 0339 04/13/21 0348 04/15/21 0337  NA 145 139 138 138  K 4.4 4.1 4.4 3.7  CL 104 102 102 98  CO2 33* 27 29 29   GLUCOSE 98 166* 124* 120*  BUN 36* 37* 39* 43*  CREATININE 0.88 1.08* 0.97 1.29*  CALCIUM 9.9 8.9 8.4* 8.3*    GFR: Estimated Creatinine Clearance: 32.7 mL/min (A) (by C-G formula based on SCr of 1.29 mg/dL (H)). Liver Function Tests: Recent Labs  Lab 04/10/21 1510 04/12/21 0339 04/13/21 0348 04/15/21 0337  AST 21 26 40 73*  ALT 20 20 23  69*  ALKPHOS 90 73 77 71  BILITOT 0.6 0.6 1.2 0.9  PROT 7.5 6.8 7.1 7.0  ALBUMIN 3.9 3.5 3.6 3.7    No results for input(s): LIPASE, AMYLASE in the last 168 hours. No results for input(s): AMMONIA in the last 168 hours. Coagulation Profile: No results for input(s): INR, PROTIME in the last 168 hours. Cardiac Enzymes: No results for input(s): CKTOTAL, CKMB, CKMBINDEX, TROPONINI in the last 168 hours. BNP (last 3 results) No results for input(s): PROBNP in the last 8760 hours. HbA1C: No results for input(s): HGBA1C in the last 72 hours. CBG: Recent Labs  Lab 04/14/21 1647 04/14/21 2152 04/15/21 0736 04/15/21 1148 04/15/21 1651  GLUCAP 189* 158* 111* 156* 244*    Lipid Profile: No results for input(s): CHOL, HDL, LDLCALC, TRIG, CHOLHDL, LDLDIRECT in the last 72 hours. Thyroid Function Tests: Recent Labs    04/15/21 0337  TSH 0.955   Anemia Panel: No results for input(s): VITAMINB12, FOLATE, FERRITIN, TIBC, IRON, RETICCTPCT in the last 72 hours. Sepsis Labs: No results for input(s): PROCALCITON, LATICACIDVEN in the last 168  hours.  Recent Results (from the past 240 hour(s))  SARS CORONAVIRUS 2 (TAT 6-24 HRS) Nasopharyngeal Nasopharyngeal Swab     Status: None   Collection Time: 04/10/21  8:41 PM   Specimen: Nasopharyngeal Swab  Result Value Ref Range Status   SARS Coronavirus 2 NEGATIVE NEGATIVE Final    Comment: (NOTE) SARS-CoV-2 target nucleic acids are NOT DETECTED.  The SARS-CoV-2 RNA is generally detectable in upper and lower respiratory specimens during the acute phase of infection. Negative results do not preclude SARS-CoV-2 infection, do not rule out co-infections with other pathogens, and should not be used as the sole basis for treatment or other patient management decisions. Negative results must be combined with clinical observations, patient history, and epidemiological information. The expected result is Negative.  Fact Sheet for Patients: 04/17/21  Fact Sheet for Healthcare Providers: 04/12/21  This test is not yet approved or cleared by the [employer] FDA  and  has been authorized for detection and/or diagnosis of SARS-CoV-2 by FDA under an Emergency Use Authorization (EUA). This EUA will remain  in effect (meaning this test can be used) for the duration of the COVID-19 declaration under Se ction 564(b)(1) of the Act, 21 U.S.C. section 360bbb-3(b)(1), unless the authorization is terminated or revoked sooner.  Performed at [facility], [address] [place], [place] [address]       Radiology Studies: No results found.  Scheduled Meds:  albuterol  2.5 mg Nebulization BID   aspirin  81 mg Oral Daily   carvedilol  3.125 mg Oral BID WC   enoxaparin (LOVENOX) injection  40 mg Subcutaneous Q24H   ferrous sulfate  174 mg Oral QPM   insulin aspart  0-15 Units Subcutaneous TID WC   insulin aspart  0-5 Units Subcutaneous QHS   levETIRAcetam  250 mg Oral BID   levothyroxine  88 mcg Oral Q0600    methylPREDNISolone (SOLU-MEDROL) injection  40 mg Intravenous Q24H   montelukast  10 mg Oral QPM   [START ON 04/16/2021] pantoprazole sodium  40 mg Per Tube Daily   pravastatin  20 mg Oral QPM   Continuous Infusions:   LOS: 4 days   Rickey Barbara, MD [facility] Pager On Amion  If 7PM-7AM, please contact night-coverage 04/15/2021, 6:38 PM

## 2021-04-16 LAB — COMPREHENSIVE METABOLIC PANEL
ALT: 69 U/L — ABNORMAL HIGH (ref 0–44)
AST: 49 U/L — ABNORMAL HIGH (ref 15–41)
Albumin: 3.6 g/dL (ref 3.5–5.0)
Alkaline Phosphatase: 63 U/L (ref 38–126)
Anion gap: 9 (ref 5–15)
BUN: 45 mg/dL — ABNORMAL HIGH (ref 8–23)
CO2: 29 mmol/L (ref 22–32)
Calcium: 8.4 mg/dL — ABNORMAL LOW (ref 8.9–10.3)
Chloride: 100 mmol/L (ref 98–111)
Creatinine, Ser: 1.11 mg/dL — ABNORMAL HIGH (ref 0.44–1.00)
GFR, Estimated: 49 mL/min — ABNORMAL LOW (ref >60)
Glucose, Bld: 118 mg/dL — ABNORMAL HIGH (ref 70–99)
Potassium: 3.5 mmol/L (ref 3.5–5.1)
Sodium: 138 mmol/L (ref 135–145)
Total Bilirubin: 0.6 mg/dL (ref 0.3–1.2)
Total Protein: 6.8 g/dL (ref 6.5–8.1)

## 2021-04-16 LAB — GLUCOSE, CAPILLARY
Glucose-Capillary: 108 mg/dL — ABNORMAL HIGH (ref 70–99)
Glucose-Capillary: 140 mg/dL — ABNORMAL HIGH (ref 70–99)
Glucose-Capillary: 214 mg/dL — ABNORMAL HIGH (ref 70–99)
Glucose-Capillary: 189 mg/dL — ABNORMAL HIGH (ref 70–99)

## 2021-04-16 MED ORDER — LISINOPRIL 10 MG PO TABS: 10.0000 mg | ORAL_TABLET | Freq: Every day | ORAL | 0 refills | Status: DC

## 2021-04-16 MED ORDER — PANTOPRAZOLE SODIUM 40 MG PO TBEC: 40.0000 mg | DELAYED_RELEASE_TABLET | Freq: Every day | ORAL | 0 refills | Status: DC

## 2021-04-16 MED ORDER — PREDNISONE 10 MG PO TABS: ORAL_TABLET | ORAL | 0 refills | Status: AC

## 2021-04-16 MED ORDER — FUROSEMIDE 40 MG PO TABS: 120.0000 mg | ORAL_TABLET | Freq: Every day | ORAL | 0 refills | Status: DC

## 2021-04-16 NOTE — Progress Notes (Signed)
Patient's O2 saturation is 91% on 2 liters nasal canula this a.m.

## 2021-04-16 NOTE — Progress Notes (Signed)
SATURATION QUALIFICATIONS: (This note is used to comply with regulatory documentation for home oxygen)  Patient Saturations on Room Air at Rest = 87%  Patient Saturations on Room Air while Ambulating = 83%  Patient Saturations on 2 Liters of oxygen while Ambulating = 91%  Please briefly explain why patient needs home oxygen: Pt is very weak, has a nagging cough and history of asthma. Pt is requiring oxygen the entire length of hospitalization to keep saturation above 90%. Pt cannot tolerate room air alone currently.

## 2021-04-16 NOTE — Discharge Summary (Signed)
Physician Discharge Summary  Leslie Chandler UYQ:[id number] DOB: March 17, 1935 DOA: 04/10/2021  PCP: Jordan Hawks, PA-C  Admit date: 04/10/2021 Discharge date: 04/16/2021  Admitted From: Group Home Disposition:  Group Home  Recommendations for Outpatient Follow-up:  Follow up with PCP in 1-2 weeks Wean O2 as tolerated  Discharge Condition:Improved CODE STATUS:DNR Diet recommendation: Diabetic, heart healthy   Brief/Interim Summary: 86 y.o. female with medical history significant for cognitive impairment, intellectual disability, epilepsy, mild persistent asthma, dysphagia, chronic diastolic heart failure, hypertension, type 2 diabetes, CKD 3a and hypothyroidism who presents with concerns of increasing shortness of breath.  Discharge Diagnoses:  Principal Problem:   Asthma exacerbation Active Problems:   Intellectual disability   Hypertension   Type 2 diabetes mellitus (HCC)   Chronic diastolic heart failure (HCC)   Seizure disorder (HCC)   Hypothyroidism  Asthma exacerbation with acute hypoxemic respiratory failure present on admission - Continued on daily IV Solu-Medrol 40 mg - Continue with scheduled albuterol nebulizer q6hr  -Improvement was noted with trial of Lasix over the weekend -Patient continues to be O2 dependent.  Patient's facility reports historically patient did require O2 in the past -Cont with PO lasix on d/c   Large hiatal hernia/dysphagia - Comparing with previous CT approximately a year ago, she has had significant worsening hiatal hernia with entire stomach being intrathoracic along with portions of the pancreas and splenic vein -Pt appears comfortable in bed, RN states pt is tolerating diet -Discussed with family who agrees with avoiding invasive measures and to focus on supportive care for now   Right lower extremity edema - venous Doppler ultrasound reviewed, neg for DVT   Hypertension -Had been continued on home Coreg, lisinopril-HCTZ -BP stable at  this time -Patient now status post course of Lasix IV -Would replace HCTZ with lasix on d/c   Type 2 diabetes -glucose elevated, likely related to current steroids -cont home meds on d/c   Hypothyroidism -Continue levothyroxine as pt needs -TSH of 0.955   CKD stage IIIa - Labs reviewed, Cr stable   History of seizures -Continue home Keppra -Remains seizure free   Acute on chronic diastolic heart failure - Initial imaging suggests volume overload -Patient responded well to IV Lasix over the weekend -Improved. Continue PO lasix on d/c   Intellectual disability - Patient resides in group home   Acute renal failure -Creatinine up to 1.29 from 0.97 -Likely secondary to recent diuresis -Lasix briefly held with improvement in renal function    Discharge Instructions   Allergies as of 04/16/2021   No Known Allergies      Medication List     STOP taking these medications    famotidine 10 MG tablet Commonly known as: PEPCID   lisinopril-hydrochlorothiazide 10-12.5 MG tablet Commonly known as: ZESTORETIC   potassium chloride 20 MEQ/15ML (10%) Soln       TAKE these medications    acetaminophen 500 MG tablet Commonly known as: TYLENOL Take 500 mg by mouth every 6 (six) hours as needed for mild pain or headache.   Aloe Vesta Protective Oint Apply 1 application topically daily as needed (as directed- to affected sites).   aspirin 81 MG chewable tablet Chew 81 mg by mouth daily.   calamine lotion Apply 1 application topically daily as needed for itching.   carvedilol 3.125 MG tablet Commonly known as: COREG Take 1 tablet (3.125 mg total) by mouth 2 (two) times daily with a meal.   dimenhyDRINATE 50 MG tablet Commonly known as: DRAMAMINE Take  50 mg by mouth every 8 (eight) hours as needed for dizziness or nausea (as directed).   diphenhydrAMINE 25 MG tablet Commonly known as: BENADRYL Take 25 mg by mouth daily as needed for itching (as directed).    ENSURE ENLIVE PO Take 237 mLs by mouth 3 (three) times daily.   eye wash Soln Place 1 drop into both eyes daily as needed (irritation).   ferrous sulfate 220 (44 Fe) MG/5ML solution Take 176 mg by mouth every evening.   First Aid Antibiotic [phone] MG-UNIT Oint Apply 1 application topically daily as needed (as directed- to affected sites).   fluticasone 110 MCG/ACT inhaler Commonly known as: FLOVENT HFA Inhale 2 puffs into the lungs 2 (two) times daily.   furosemide 40 MG tablet Commonly known as: Lasix Take 3 tablets (120 mg total) by mouth daily.   guaiFENesin-dextromethorphan 100-10 MG/5ML syrup Commonly known as: ROBITUSSIN DM Take 10 mLs by mouth every 6 (six) hours as needed for cough.   HCA LIQUID CALCIUM/VITAMIN D PO Take 3 mLs by mouth daily.   hydrocortisone cream 1 % Apply 1 application topically daily as needed for itching.   ibuprofen 200 MG tablet Commonly known as: ADVIL Take 200 mg by mouth every 6 (six) hours as needed for headache or mild pain.   levETIRAcetam 100 MG/ML solution Commonly known as: KEPPRA Take 2.5 mLs by mouth 2 (two) times daily.   levothyroxine 88 MCG tablet Commonly known as: SYNTHROID Take 88 mcg by mouth daily at 12 noon.   lisinopril 10 MG tablet Commonly known as: ZESTRIL Take 1 tablet (10 mg total) by mouth daily.   loperamide 2 MG tablet Commonly known as: IMODIUM A-D Take 2 mg by mouth 4 (four) times daily as needed for diarrhea or loose stools.   metFORMIN 500 MG tablet Commonly known as: GLUCOPHAGE Take 500 mg by mouth daily.   Milk of Magnesia Concentrate 2400 MG/10ML Susp Generic drug: Magnesium Hydroxide Take 5 mLs by mouth daily as needed (constipation).   montelukast 10 MG tablet Commonly known as: SINGULAIR Take 10 mg by mouth every evening.   pantoprazole 40 MG tablet Commonly known as: Protonix Take 1 tablet (40 mg total) by mouth daily.   Pepto-Bismol Max Strength 525 MG/15ML Susp Generic  drug: Bismuth Subsalicylate Take 5-15 mLs by mouth daily as needed (for indigestion).   pravastatin 20 MG tablet Commonly known as: PRAVACHOL Take 20 mg by mouth every evening.   predniSONE 10 MG tablet Commonly known as: DELTASONE Take 6 tablets (60 mg total) by mouth daily for 2 days, THEN 4 tablets (40 mg total) daily for 2 days, THEN 2 tablets (20 mg total) daily for 2 days, THEN 1 tablet (10 mg total) daily for 2 days, THEN 0.5 tablets (5 mg total) daily for 2 days. Start taking on: April 16, 2021   SUDAFED PE PRESSURE+PAIN+COUGH PO Take 5 mLs by mouth every 6 (six) hours as needed (for symptoms).   SUNSCREENS EX Apply 1 application topically daily as needed (for skin protection).   VIACTIV PO Take 1 tablet by mouth daily. Chew 1 tablet by mouth in the morning        No Known Allergies  Procedures/Studies: CT Chest W Contrast  Result Date: 04/10/2021 CLINICAL DATA:  86 year old with shortness of breath. Abnormal x-ray. EXAM: CT CHEST WITH CONTRAST TECHNIQUE: Multidetector CT imaging of the chest was performed during intravenous contrast administration. CONTRAST:  61mL OMNIPAQUE IOHEXOL 350 MG/ML SOLN COMPARISON:  Chest radiograph earlier  today.  Chest CT 04/11/2014 FINDINGS: Cardiovascular: Aortic atherosclerosis and tortuosity. Exam not tailored for pulmonary artery evaluation, no obvious central pulmonary embolus. Borderline cardiomegaly. No pericardial effusion. There are coronary artery calcifications. Mediastinum/Nodes: Large hiatal hernia with the entire stomach being intrathoracic. Hiatal hernia also contains the pancreas and portions of the splenic vein. The esophagus is fluid-filled and mildly dilated to the level of the thoracic inlet. No thyroid nodule. Scattered small mediastinal and hilar lymph nodes, not enlarged by size criteria. Lungs/Pleura: Breathing motion artifact limits detailed assessment. Compressive atelectasis in the lung bases related to large hiatal  hernia. Heterogeneous pulmonary parenchyma. Tiny subpleural nodules in the left upper lobe, series 7, image 38 these are present on prior exam from 2015 and considered benign. There is no suspicious pulmonary nodule. Linear atelectasis in the periphery of the left upper lobe which may account for radiographic appearance. Motion obscures tracheobronchial evaluation,. No pleural fluid. Upper Abdomen: Entire stomach is intrathoracic. More than half of the pancreas extends into hiatal hernia and is intrathoracic. Multiple intraluminal gallstones. Suspected hepatic steatosis. No acute upper abdominal findings. Musculoskeletal: Multiple remote right rib fractures. Side exaggerated thoracic kyphosis. Vertebral body hemangioma within T4. Macrolobulated left breast calcification is stable from prior. Additional scattered calcifications within both breasts. No suspicious chest wall abnormality. IMPRESSION: 1. Large hiatal hernia with the entire stomach being intrathoracic. No gastric inflammation or obstruction. Hiatal hernia also contains the pancreas and portions of the splenic vein. The esophagus is fluid-filled and mildly dilated to the level of the thoracic inlet, suggesting reflux or dysmotility. 2. Linear atelectasis in the periphery of the left upper lobe may account for radiographic appearance. No suspicious pulmonary nodule. 3. Mild cardiomegaly.  Aortic atherosclerosis. 4. Heterogeneous pulmonary parenchyma, can be seen with small vessel or small airways disease. 5. Cholelithiasis incidentally noted in the upper abdomen. Aortic Atherosclerosis (ICD10-I70.0). Electronically Signed   By: Narda RutherfordMelanie  Sanford M.D.   On: 04/10/2021 17:32   DG Chest Port 1 View  Result Date: 04/10/2021 CLINICAL DATA:  Cough, wheezing and shortness of breath in an 86 year old female. EXAM: PORTABLE CHEST 1 VIEW COMPARISON:  July 15, 2019. FINDINGS: EKG leads project over the chest. Heart size remains markedly enlarged. Signs of  central pulmonary vascular engorgement and increased interstitial markings. No lobar consolidative process. Density along the LEFT pleural surface at the site of previous peripheral opacity measures up to 13 mm. This persists with other areas of patchy density seen on the prior study largely resolved. Signs of hiatal hernia in the retrocardiac region. On limited assessment no acute skeletal process. IMPRESSION: Marked cardiomegaly with signs of potential early CHF/volume overload. No lobar consolidation. Potential scarring or nodularity along the LEFT lateral chest this patient with previous multifocal pneumonia. Given persistence suggest PA and lateral chest radiograph on follow-up when the patient is able or chest CT for further evaluation. Signs of hiatal hernia in the retrocardiac region. Electronically Signed   By: Donzetta KohutGeoffrey  Wile M.D.   On: 04/10/2021 16:02   VAS US LOWER EXTREMITY VENOUS (DVT)  Result Date: 04/11/2021  Lower Venous DVT Study Patient Name:  Leslie DeedJOAN Muckleroy  Date of Exam:   04/11/2021 Medical Rec #: [id number]   Accession #:    [id number] Date of Birth: 11/21/1934   Patient Gender: F Patient Age:   [age] years [facility] Procedure:      VAS US LOWER EXTREMITY VENOUS (DVT) Referring Phys: CHING TU --------------------------------------------------------------------------------  Indications: Edema.  Limitations: Poor ultrasound/tissue interface  and patient inability to cooperate. Comparison Study: No previous exams Performing Technologist: Jody Hill RVT, RDMS  Examination Guidelines: A complete evaluation includes B-mode imaging, spectral Doppler, color Doppler, and power Doppler as needed of all accessible portions of each vessel. Bilateral testing is considered an integral part of a complete examination. Limited examinations for reoccurring indications may be performed as noted. The reflux portion of the exam is performed with the patient in reverse Trendelenburg.   +---------+---------------+---------+-----------+----------+--------------+ RIGHT    CompressibilityPhasicitySpontaneityPropertiesThrombus Aging +---------+---------------+---------+-----------+----------+--------------+ CFV      Full           Yes      Yes                                 +---------+---------------+---------+-----------+----------+--------------+ SFJ      Full                                                        +---------+---------------+---------+-----------+----------+--------------+ FV Prox  Full           Yes      Yes                                 +---------+---------------+---------+-----------+----------+--------------+ FV Mid   Full           Yes      Yes                                 +---------+---------------+---------+-----------+----------+--------------+ FV DistalFull           Yes      Yes                                 +---------+---------------+---------+-----------+----------+--------------+ PFV      Full                                                        +---------+---------------+---------+-----------+----------+--------------+ POP      Full           Yes      Yes                                 +---------+---------------+---------+-----------+----------+--------------+ PTV      Full                                                        +---------+---------------+---------+-----------+----------+--------------+ PERO     Full                                                        +---------+---------------+---------+-----------+----------+--------------+   +----+---------------+---------+-----------+----------+--------------+  LEFTCompressibilityPhasicitySpontaneityPropertiesThrombus Aging +----+---------------+---------+-----------+----------+--------------+ CFV Full           Yes      Yes                                  +----+---------------+---------+-----------+----------+--------------+     Summary: RIGHT: - There is no evidence of deep vein thrombosis in the lower extremity. - There is no evidence of superficial venous thrombosis.  - No cystic structure found in the popliteal fossa.  LEFT: - No evidence of common femoral vein obstruction.  *See table(s) above for measurements and observations. Electronically signed by Heath Lark on 04/11/2021 at 5:58:38 PM.    Final     Subjective: Eager to return to group home  Discharge Exam: Vitals:   04/16/21 0902 04/16/21 1345  BP:    Pulse:  (!) 58  Resp:    Temp:  98 F (36.7 C)  SpO2: 100% (!) 87%   Vitals:   04/16/21 0652 04/16/21 0720 04/16/21 0902 04/16/21 1345  BP:  134/84    Pulse:  (!) 51  (!) 58  Resp:      Temp:  97.8 F (36.6 C)  98 F (36.7 C)  TempSrc:  Oral  Oral  SpO2:  99% 100% (!) 87%  Weight: 83 kg     Height:        General: Pt is alert, awake, not in acute distress Cardiovascular: RRR, S1/S2 + Respiratory: CTA bilaterally, no wheezing, no rhonchi Abdominal: Soft, NT, ND, bowel sounds + Extremities: no edema, no cyanosis   The results of significant diagnostics from this hospitalization (including imaging, microbiology, ancillary and laboratory) are listed below for reference.     Microbiology: Recent Results (from the past 240 hour(s))  SARS CORONAVIRUS 2 (TAT 6-24 HRS) Nasopharyngeal Nasopharyngeal Swab     Status: None   Collection Time: 04/10/21  8:41 PM   Specimen: Nasopharyngeal Swab  Result Value Ref Range Status   SARS Coronavirus 2 NEGATIVE NEGATIVE Final    Comment: (NOTE) SARS-CoV-2 target nucleic acids are NOT DETECTED.  The SARS-CoV-2 RNA is generally detectable in upper and lower respiratory specimens during the acute phase of infection. Negative results do not preclude SARS-CoV-2 infection, do not rule out co-infections with other pathogens, and should not be used as the sole basis for treatment or  other patient management decisions. Negative results must be combined with clinical observations, patient history, and epidemiological information. The expected result is Negative.  Fact Sheet for Patients: HairSlick.no  Fact Sheet for Healthcare Providers: quierodirigir.com  This test is not yet approved or cleared by the [place] FDA and  has been authorized for detection and/or diagnosis of SARS-CoV-2 by FDA under an Emergency Use Authorization (EUA). This EUA will remain  in effect (meaning this test can be used) for the duration of the COVID-19 declaration under Se ction 564(b)(1) of the Act, 21 U.S.C. section 360bbb-3(b)(1), unless the authorization is terminated or revoked sooner.  Performed at [facility], [address], [place], [place] [address]      Labs: BNP (last 3 results) Recent Labs    04/10/21 1510  BNP 192.6*   Basic Metabolic Panel: Recent Labs  Lab 04/10/21 1510 04/12/21 0339 04/13/21 0348 04/15/21 0337 04/16/21 0324  NA 145 139 138 138 138  K 4.4 4.1 4.4 3.7 3.5  CL 104 102 102 98 100  CO2 33* 27 29 29  29  GLUCOSE 98 166* 124* 120* 118*  BUN 36* 37* 39* 43* 45*  CREATININE 0.88 1.08* 0.97 1.29* 1.11*  CALCIUM 9.9 8.9 8.4* 8.3* 8.4*   Liver Function Tests: Recent Labs  Lab 04/10/21 1510 04/12/21 0339 04/13/21 0348 04/15/21 0337 04/16/21 0324  AST 21 26 40 73* 49*  ALT 69* 69*  ALKPHOS 90 73 77 71 63  BILITOT 0.6 0.6 1.2 0.9 0.6  PROT 7.5 6.8 7.1 7.0 6.8  ALBUMIN 3.9 3.5 3.6 3.7 3.6   No results for input(s): LIPASE, AMYLASE in the last 168 hours. No results for input(s): AMMONIA in the last 168 hours. CBC: Recent Labs  Lab 04/10/21 1510 04/12/21 0339 04/13/21 0348  WBC 8.0 14.4* 11.3*  NEUTROABS 5.0  --   --   HGB 12.0 12.3 11.6*  HCT 38.2 38.1 35.6*  MCV 91.8 90.3 89.4  PLT 152 135* 149*   Cardiac Enzymes: No results for input(s): CKTOTAL,  CKMB, CKMBINDEX, TROPONINI in the last 168 hours. BNP: Invalid input(s): POCBNP CBG: Recent Labs  Lab 04/15/21 1148 04/15/21 1651 04/15/21 2341 04/16/21 0732 04/16/21 1153  GLUCAP 156* 244* 200* 108* 140*   D-Dimer No results for input(s): DDIMER in the last 72 hours. Hgb A1c No results for input(s): HGBA1C in the last 72 hours. Lipid Profile No results for input(s): CHOL, HDL, LDLCALC, TRIG, CHOLHDL, LDLDIRECT in the last 72 hours. Thyroid function studies Recent Labs    04/15/21 0337  TSH 0.955   Anemia work up No results for input(s): VITAMINB12, FOLATE, FERRITIN, TIBC, IRON, RETICCTPCT in the last 72 hours. Urinalysis    Component Value Date/Time   COLORURINE YELLOW 03/22/2020 1044   APPEARANCEUR CLEAR 03/22/2020 1044   LABSPEC 1.018 03/22/2020 1044   PHURINE 7.0 03/22/2020 1044   GLUCOSEU 150 (A) 03/22/2020 1044   HGBUR NEGATIVE 03/22/2020 1044   BILIRUBINUR NEGATIVE 03/22/2020 1044   KETONESUR NEGATIVE 03/22/2020 1044   PROTEINUR NEGATIVE 03/22/2020 1044   NITRITE NEGATIVE 03/22/2020 1044   LEUKOCYTESUR NEGATIVE 03/22/2020 1044   Sepsis Labs Invalid input(s): PROCALCITONIN,  WBC,  LACTICIDVEN Microbiology Recent Results (from the past 240 hour(s))  SARS CORONAVIRUS 2 (TAT 6-24 HRS) Nasopharyngeal Nasopharyngeal Swab     Status: None   Collection Time: 04/10/21  8:41 PM   Specimen: Nasopharyngeal Swab  Result Value Ref Range Status   SARS Coronavirus 2 NEGATIVE NEGATIVE Final    Comment: (NOTE) SARS-CoV-2 target nucleic acids are NOT DETECTED.  The SARS-CoV-2 RNA is generally detectable in upper and lower respiratory specimens during the acute phase of infection. Negative results do not preclude SARS-CoV-2 infection, do not rule out co-infections with other pathogens, and should not be used as the sole basis for treatment or other patient management decisions. Negative results must be combined with clinical observations, patient history, and  epidemiological information. The expected result is Negative.  Fact Sheet for Patients: HairSlick.no  Fact Sheet for Healthcare Providers: quierodirigir.com  This test is not yet approved or cleared by the [place] FDA and  has been authorized for detection and/or diagnosis of SARS-CoV-2 by FDA under an Emergency Use Authorization (EUA). This EUA will remain  in effect (meaning this test can be used) for the duration of the COVID-19 declaration under Se ction 564(b)(1) of the Act, 21 U.S.C. section 360bbb-3(b)(1), unless the authorization is terminated or revoked sooner.  Performed at [facility], [address], [place], [place] [address]    Time spent: 30 min  SIGNED:   Rickey Barbara, MD  [facility] 04/16/2021, 2:02 PM  If 7PM-7AM, please contact night-coverage

## 2021-04-16 NOTE — Progress Notes (Signed)
Oxygen removed from pt to assess O2 saturation. Sat dropped to 87% on room air.

## 2021-04-16 NOTE — TOC Transition Note (Signed)
Transition of Care [facility]) - CM/SW Discharge Note  Patient Details  Name: Leslie Chandler MRN: [id number] Date of Birth: [birth year]  [facility]) CM/SW Contact:  Ewing Schlein, LCSW Phone Number: 04/16/2021, 2:50 PM  Clinical Narrative: Patient is medically stable to for discharge back to her group home. CSW notified the patient's sister/POA and the medical coordinator, Ms. Dareen Piano, that patient will discharge back today. Home O2 set up with Jermaine through [employer]; O2 to be delivered to patient's room. Medical necessity form done; PTAR scheduled. Discharge packet completed. CSW updated RN. TOC signing off.  Final next level of care: Home/Self Care Barriers to Discharge: Barriers Resolved  Patient Goals and CMS Choice Patient states their goals for this hospitalization and ongoing recovery are:: Return to group home CMS Medicare.gov Compare Post Acute Care list provided to:: Patient Represenative (must comment) Choice offered to / list presented to : [facility] POA / Guardian  Discharge Placement        Patient chooses bed at: Other - please specify in the comment section below: ([facility] group home) Patient to be transferred to facility by: PTAR Name of family member notified: June [name] (legal guardian/POA) Patient and family notified of of transfer: 04/16/21  Discharge Plan and Services In-house Referral: Clinical Social Work Post Acute Care Choice: NA          DME Arranged: Oxygen DME Agency: [employer] Date DME Agency Contacted: 04/16/21 Time DME Agency Contacted: [phone] Representative spoke with at DME Agency: [facility]  Readmission Risk Interventions No flowsheet data found.

## 2021-04-16 NOTE — Progress Notes (Signed)
Patient left for group home via transport.

## 2021-12-28 ENCOUNTER — Emergency Department (HOSPITAL_COMMUNITY): Payer: Medicare Other

## 2021-12-28 ENCOUNTER — Inpatient Hospital Stay (HOSPITAL_COMMUNITY)
Admission: EM | Admit: 2021-12-28 | Discharge: 2021-12-30 | DRG: 193 | Disposition: A | Discharge status: Other Acute Inpatient Hospital | Payer: Medicare Other | Attending: Internal Medicine | Admitting: Internal Medicine

## 2021-12-28 ENCOUNTER — Other Ambulatory Visit: Payer: Self-pay

## 2021-12-28 DIAGNOSIS — Z66 Do not resuscitate: Secondary | ICD-10-CM | POA: Diagnosis present

## 2021-12-28 DIAGNOSIS — I110 Hypertensive heart disease with heart failure: Secondary | ICD-10-CM | POA: Diagnosis present

## 2021-12-28 DIAGNOSIS — G40909 Epilepsy, unspecified, not intractable, without status epilepticus: Secondary | ICD-10-CM | POA: Diagnosis present

## 2021-12-28 DIAGNOSIS — F79 Unspecified intellectual disabilities: Secondary | ICD-10-CM | POA: Diagnosis present

## 2021-12-28 DIAGNOSIS — E039 Hypothyroidism, unspecified: Secondary | ICD-10-CM | POA: Diagnosis present

## 2021-12-28 DIAGNOSIS — J189 Pneumonia, unspecified organism: Secondary | ICD-10-CM | POA: Diagnosis present

## 2021-12-28 DIAGNOSIS — J9601 Acute respiratory failure with hypoxia: Secondary | ICD-10-CM | POA: Diagnosis present

## 2021-12-28 DIAGNOSIS — Z20822 Contact with and (suspected) exposure to covid-19: Secondary | ICD-10-CM | POA: Diagnosis present

## 2021-12-28 DIAGNOSIS — I10 Essential (primary) hypertension: Secondary | ICD-10-CM | POA: Diagnosis present

## 2021-12-28 DIAGNOSIS — J45901 Unspecified asthma with (acute) exacerbation: Secondary | ICD-10-CM | POA: Diagnosis present

## 2021-12-28 DIAGNOSIS — Z79899 Other long term (current) drug therapy: Secondary | ICD-10-CM

## 2021-12-28 DIAGNOSIS — K449 Diaphragmatic hernia without obstruction or gangrene: Secondary | ICD-10-CM | POA: Diagnosis present

## 2021-12-28 DIAGNOSIS — N179 Acute kidney failure, unspecified: Principal | ICD-10-CM

## 2021-12-28 DIAGNOSIS — Z7982 Long term (current) use of aspirin: Secondary | ICD-10-CM | POA: Diagnosis not present

## 2021-12-28 DIAGNOSIS — Z809 Family history of malignant neoplasm, unspecified: Secondary | ICD-10-CM

## 2021-12-28 DIAGNOSIS — E119 Type 2 diabetes mellitus without complications: Secondary | ICD-10-CM | POA: Diagnosis present

## 2021-12-28 DIAGNOSIS — Z79890 Hormone replacement therapy (postmenopausal): Secondary | ICD-10-CM

## 2021-12-28 DIAGNOSIS — Z7984 Long term (current) use of oral hypoglycemic drugs: Secondary | ICD-10-CM | POA: Diagnosis not present

## 2021-12-28 DIAGNOSIS — M810 Age-related osteoporosis without current pathological fracture: Secondary | ICD-10-CM | POA: Diagnosis present

## 2021-12-28 DIAGNOSIS — E785 Hyperlipidemia, unspecified: Secondary | ICD-10-CM | POA: Diagnosis present

## 2021-12-28 DIAGNOSIS — R6250 Unspecified lack of expected normal physiological development in childhood: Secondary | ICD-10-CM | POA: Diagnosis present

## 2021-12-28 DIAGNOSIS — I5032 Chronic diastolic (congestive) heart failure: Secondary | ICD-10-CM | POA: Diagnosis present

## 2021-12-28 DIAGNOSIS — J4531 Mild persistent asthma with (acute) exacerbation: Secondary | ICD-10-CM

## 2021-12-28 DIAGNOSIS — J209 Acute bronchitis, unspecified: Secondary | ICD-10-CM

## 2021-12-28 LAB — COMPREHENSIVE METABOLIC PANEL
ALT: 14 U/L (ref 0–44)
AST: 16 U/L (ref 15–41)
Albumin: 3.5 g/dL (ref 3.5–5.0)
Alkaline Phosphatase: 82 U/L (ref 38–126)
Anion gap: 9 (ref 5–15)
BUN: 36 mg/dL — ABNORMAL HIGH (ref 8–23)
CO2: 29 mmol/L (ref 22–32)
Calcium: 9.1 mg/dL (ref 8.9–10.3)
Chloride: 102 mmol/L (ref 98–111)
Creatinine, Ser: 2 mg/dL — ABNORMAL HIGH (ref 0.44–1.00)
GFR, Estimated: 24 mL/min — ABNORMAL LOW (ref >60)
Glucose, Bld: 135 mg/dL — ABNORMAL HIGH (ref 70–99)
Potassium: 3.7 mmol/L (ref 3.5–5.1)
Sodium: 140 mmol/L (ref 135–145)
Total Bilirubin: 0.8 mg/dL (ref 0.3–1.2)
Total Protein: 7.4 g/dL (ref 6.5–8.1)

## 2021-12-28 LAB — URINALYSIS, ROUTINE W REFLEX MICROSCOPIC
Bilirubin Urine: NEGATIVE
Glucose, UA: >=500 mg/dL — AB
Hgb urine dipstick: NEGATIVE
Ketones, ur: NEGATIVE mg/dL
Leukocytes,Ua: NEGATIVE
Nitrite: NEGATIVE
Protein, ur: NEGATIVE mg/dL
Specific Gravity, Urine: 1.003 — ABNORMAL LOW (ref 1.005–1.030)
pH: 7 (ref 5.0–8.0)

## 2021-12-28 LAB — PROCALCITONIN: Procalcitonin: 0.46 ng/mL

## 2021-12-28 LAB — LACTIC ACID, PLASMA
Lactic Acid, Venous: 1.5 mmol/L (ref 0.5–1.9)
Lactic Acid, Venous: 1.3 mmol/L (ref 0.5–1.9)

## 2021-12-28 LAB — CBC
HCT: 32 % — ABNORMAL LOW (ref 36.0–46.0)
Hemoglobin: 10.3 g/dL — ABNORMAL LOW (ref 12.0–15.0)
MCH: 30.1 pg (ref 26.0–34.0)
MCHC: 32.2 g/dL (ref 30.0–36.0)
MCV: 93.6 fL (ref 80.0–100.0)
Platelets: 155 10*3/uL (ref 150–400)
RBC: 3.42 MIL/uL — ABNORMAL LOW (ref 3.87–5.11)
RDW: 14.4 % (ref 11.5–15.5)
WBC: 12.2 10*3/uL — ABNORMAL HIGH (ref 4.0–10.5)
nRBC: 0 % (ref 0.0–0.2)

## 2021-12-28 LAB — BRAIN NATRIURETIC PEPTIDE: B Natriuretic Peptide: 209.6 pg/mL — ABNORMAL HIGH (ref 0.0–100.0)

## 2021-12-28 LAB — RESP PANEL BY RT-PCR (FLU A&B, COVID) ARPGX2
Influenza A by PCR: NEGATIVE
Influenza B by PCR: NEGATIVE
SARS Coronavirus 2 by RT PCR: NEGATIVE

## 2021-12-28 LAB — GLUCOSE, CAPILLARY: Glucose-Capillary: 298 mg/dL — ABNORMAL HIGH (ref 70–99)

## 2021-12-28 LAB — STREP PNEUMONIAE URINARY ANTIGEN: Strep Pneumo Urinary Antigen: NEGATIVE

## 2021-12-28 MED ORDER — CEFTRIAXONE SODIUM 1 G IJ SOLR
1.0000 g | Freq: Once | INTRAMUSCULAR | Status: AC
  Administered 2021-12-28: 1 g via INTRAVENOUS
  Filled 2021-12-28: qty 10

## 2021-12-28 MED ORDER — PRAVASTATIN SODIUM 20 MG PO TABS
20.0000 mg | ORAL_TABLET | Freq: Every evening | ORAL | Status: DC
Start: 2021-12-28 — End: 2021-12-30
  Administered 2021-12-28 – 2021-12-29 (×2): 20 mg via ORAL
  Filled 2021-12-28 (×2): qty 1

## 2021-12-28 MED ORDER — IPRATROPIUM-ALBUTEROL 0.5-2.5 (3) MG/3ML IN SOLN
3.0000 mL | RESPIRATORY_TRACT | Status: AC
  Administered 2021-12-28: 3 mL via RESPIRATORY_TRACT
  Filled 2021-12-28: qty 3

## 2021-12-28 MED ORDER — GUAIFENESIN-DM 100-10 MG/5ML PO SYRP
5.0000 mL | ORAL_SOLUTION | ORAL | Status: DC | PRN
  Administered 2021-12-28 – 2021-12-29 (×3): 5 mL via ORAL
  Filled 2021-12-28 (×3): qty 5

## 2021-12-28 MED ORDER — LEVETIRACETAM 100 MG/ML PO SOLN
250.0000 mg | Freq: Two times a day (BID) | ORAL | Status: DC
  Administered 2021-12-28 – 2021-12-30 (×4): 250 mg via ORAL
  Filled 2021-12-28 (×5): qty 2.5

## 2021-12-28 MED ORDER — METHYLPREDNISOLONE SODIUM SUCC 40 MG IJ SOLR
40.0000 mg | Freq: Two times a day (BID) | INTRAMUSCULAR | Status: DC
  Administered 2021-12-28 – 2021-12-30 (×3): 40 mg via INTRAVENOUS
  Filled 2021-12-28 (×4): qty 1

## 2021-12-28 MED ORDER — MONTELUKAST SODIUM 10 MG PO TABS
10.0000 mg | ORAL_TABLET | Freq: Every evening | ORAL | Status: DC
  Administered 2021-12-28 – 2021-12-29 (×2): 10 mg via ORAL
  Filled 2021-12-28 (×2): qty 1

## 2021-12-28 MED ORDER — INSULIN ASPART 100 UNIT/ML IJ SOLN
0.0000 [IU] | Freq: Three times a day (TID) | INTRAMUSCULAR | Status: DC
  Administered 2021-12-29: 5 [IU] via SUBCUTANEOUS
  Administered 2021-12-29 – 2021-12-30 (×4): 3 [IU] via SUBCUTANEOUS
  Filled 2021-12-28: qty 0.09

## 2021-12-28 MED ORDER — ORAL CARE MOUTH RINSE
15.0000 mL | Freq: Two times a day (BID) | OROMUCOSAL | Status: DC
  Administered 2021-12-30: 15 mL via OROMUCOSAL

## 2021-12-28 MED ORDER — SODIUM CHLORIDE 0.9 % IV BOLUS
1000.0000 mL | Freq: Once | INTRAVENOUS | Status: AC
  Administered 2021-12-28: 1000 mL via INTRAVENOUS

## 2021-12-28 MED ORDER — BUDESONIDE 0.25 MG/2ML IN SUSP: 0.2500 mg | Freq: Two times a day (BID) | RESPIRATORY_TRACT | Status: DC

## 2021-12-28 MED ORDER — IPRATROPIUM-ALBUTEROL 0.5-2.5 (3) MG/3ML IN SOLN
3.0000 mL | Freq: Four times a day (QID) | RESPIRATORY_TRACT | Status: DC
  Administered 2021-12-28 – 2021-12-29 (×4): 3 mL via RESPIRATORY_TRACT
  Filled 2021-12-28 (×3): qty 3

## 2021-12-28 MED ORDER — SODIUM CHLORIDE 0.9 % IV SOLN
2.0000 g | INTRAVENOUS | Status: DC
  Administered 2021-12-29 – 2021-12-30 (×2): 2 g via INTRAVENOUS
  Filled 2021-12-28 (×2): qty 20

## 2021-12-28 MED ORDER — FLUTICASONE PROPIONATE HFA 110 MCG/ACT IN AERO: 2.0000 | INHALATION_SPRAY | Freq: Two times a day (BID) | RESPIRATORY_TRACT | Status: DC

## 2021-12-28 MED ORDER — FUROSEMIDE 10 MG/ML IJ SOLN
40.0000 mg | Freq: Once | INTRAMUSCULAR | Status: AC
  Administered 2021-12-28: 40 mg via INTRAVENOUS
  Filled 2021-12-28: qty 4

## 2021-12-28 MED ORDER — ENOXAPARIN SODIUM 30 MG/0.3ML IJ SOSY
30.0000 mg | PREFILLED_SYRINGE | INTRAMUSCULAR | Status: DC
  Administered 2021-12-28 – 2021-12-29 (×2): 30 mg via SUBCUTANEOUS
  Filled 2021-12-28 (×2): qty 0.3

## 2021-12-28 MED ORDER — INSULIN ASPART 100 UNIT/ML IJ SOLN
0.0000 [IU] | Freq: Every day | INTRAMUSCULAR | Status: DC
  Administered 2021-12-28: 3 [IU] via SUBCUTANEOUS
  Filled 2021-12-28: qty 0.05

## 2021-12-28 MED ORDER — LISINOPRIL 10 MG PO TABS: 10.0000 mg | ORAL_TABLET | Freq: Every day | ORAL | Status: DC

## 2021-12-28 MED ORDER — IPRATROPIUM-ALBUTEROL 0.5-2.5 (3) MG/3ML IN SOLN
3.0000 mL | Freq: Four times a day (QID) | RESPIRATORY_TRACT | Status: DC | PRN
  Filled 2021-12-28: qty 3

## 2021-12-28 MED ORDER — LEVOTHYROXINE SODIUM 88 MCG PO TABS
88.0000 ug | ORAL_TABLET | Freq: Every day | ORAL | Status: DC
  Administered 2021-12-29 – 2021-12-30 (×2): 88 ug via ORAL
  Filled 2021-12-28 (×2): qty 1

## 2021-12-28 MED ORDER — PANTOPRAZOLE SODIUM 40 MG PO TBEC
40.0000 mg | DELAYED_RELEASE_TABLET | Freq: Every day | ORAL | Status: DC
Start: 2021-12-29 — End: 2021-12-30
  Administered 2021-12-29 – 2021-12-30 (×2): 40 mg via ORAL
  Filled 2021-12-28 (×2): qty 1

## 2021-12-28 MED ORDER — IPRATROPIUM-ALBUTEROL 0.5-2.5 (3) MG/3ML IN SOLN
3.0000 mL | Freq: Once | RESPIRATORY_TRACT | Status: AC
Start: 2021-12-28 — End: 2021-12-28
  Administered 2021-12-28: 3 mL via RESPIRATORY_TRACT
  Filled 2021-12-28: qty 3

## 2021-12-28 MED ORDER — ALBUTEROL SULFATE (2.5 MG/3ML) 0.083% IN NEBU
2.5000 mg | INHALATION_SOLUTION | Freq: Once | RESPIRATORY_TRACT | Status: AC
  Administered 2021-12-28: 2.5 mg via RESPIRATORY_TRACT
  Filled 2021-12-28: qty 3

## 2021-12-28 MED ORDER — METOPROLOL TARTRATE 5 MG/5ML IV SOLN: 2.5000 mg | Freq: Four times a day (QID) | INTRAVENOUS | Status: DC | PRN

## 2021-12-28 MED ORDER — SODIUM CHLORIDE 0.9 % IV SOLN
500.0000 mg | INTRAVENOUS | Status: DC
  Administered 2021-12-28: 500 mg via INTRAVENOUS
  Filled 2021-12-28 (×2): qty 5

## 2021-12-28 MED ORDER — ADULT MULTIVITAMIN W/MINERALS CH
1.0000 | ORAL_TABLET | Freq: Every day | ORAL | Status: DC
  Administered 2021-12-29 – 2021-12-30 (×2): 1 via ORAL
  Filled 2021-12-28 (×2): qty 1

## 2021-12-28 MED ORDER — METOPROLOL TARTRATE 25 MG PO TABS
12.5000 mg | ORAL_TABLET | Freq: Two times a day (BID) | ORAL | Status: DC
  Administered 2021-12-28 – 2021-12-30 (×4): 12.5 mg via ORAL
  Filled 2021-12-28 (×4): qty 1

## 2021-12-28 MED ORDER — SODIUM CHLORIDE 0.9 % IV SOLN
500.0000 mg | INTRAVENOUS | Status: DC
  Administered 2021-12-29: 500 mg via INTRAVENOUS
  Filled 2021-12-28 (×2): qty 5

## 2021-12-28 MED ORDER — METHYLPREDNISOLONE SODIUM SUCC 125 MG IJ SOLR
125.0000 mg | Freq: Once | INTRAMUSCULAR | Status: AC
Start: 2021-12-28 — End: 2021-12-28
  Administered 2021-12-28: 125 mg via INTRAVENOUS
  Filled 2021-12-28: qty 2

## 2021-12-28 MED ORDER — BUDESONIDE 0.5 MG/2ML IN SUSP
0.5000 mg | Freq: Two times a day (BID) | RESPIRATORY_TRACT | Status: DC
  Administered 2021-12-28 – 2021-12-30 (×4): 0.5 mg via RESPIRATORY_TRACT
  Filled 2021-12-28 (×4): qty 2

## 2021-12-28 MED ORDER — ASPIRIN 81 MG PO CHEW
81.0000 mg | CHEWABLE_TABLET | Freq: Every day | ORAL | Status: DC
  Administered 2021-12-29 – 2021-12-30 (×2): 81 mg via ORAL
  Filled 2021-12-28 (×2): qty 1

## 2021-12-28 NOTE — ED Notes (Signed)
Bed alarm has been placed for pt's safety.  

## 2021-12-28 NOTE — Plan of Care (Signed)

## 2021-12-28 NOTE — Assessment & Plan Note (Signed)
Continue lisinopril

## 2021-12-28 NOTE — Assessment & Plan Note (Addendum)
-   baseline creatinine ~ 1-1.2 ?- patient presents with increase in creat >0.3 mg/dL above baseline, creat increase >1.5x baseline presumed to have occurred within past 7 days PTA ?- creat 2 on admission ?- trial of lasix with suspicion for possible component of CRS ?- creatinine improved with lasix and had good urine output ?

## 2021-12-28 NOTE — Hospital Course (Addendum)
Ms. Alvidrez is an 87 yo female with PMH severe IDD, HTN, dCHF, asthma who presented from her group home with cough and SOB.  She has been intermittently on oxygen.  She was recently taken off of oxygen at her group home as she did not need it anymore.  Prior to admission, she was found to be hypoxic along with her shortness of breath, cough and was sent to the ER for further evaluation. ?CXR showed possible pulmonary vascular congestion however low lung volumes were noted.  She has chronic atelectasis within the right medial lung base.  CT chest was then performed which was negative for acute airspace opacities.  She has a known large hiatal hernia with compressive atelectasis due to this. ?She was given a dose of Rocephin, azithromycin, nebulizers, Solu-Medrol, and Lasix. ?Last hospitalization in August 2022 for asthma exacerbation also responded well to intermittent Lasix dosing. ?Overall, she responded well to treatment which included antibiotics, steroids, and lasix. Her procalcitonin was mildly elevated, so completing an empiric course of antibiotics was considered prudent given her chronic atelectasis and risk of pneumonia. She will complete her remainder course of antibiotics and steroid at discharge.  ?She was weaned off oxygen prior to discharge and had no oxygen desaturation prior to discharge. She still may however need O2 in the future if she does have recurrent hypoxia which could be contributed to from her hernia.  ?

## 2021-12-28 NOTE — Assessment & Plan Note (Addendum)
-   home regimen resumed at discharge  

## 2021-12-28 NOTE — H&P (Signed)
?History and Physical  ? ? ?Leslie Chandler  ?JXB:[id number]  ?DOB: June 16, 1935  ?DOA: 12/28/2021 ? ?PCP: Jordan Hawks, PA-C ?Patient coming from: Group home ? ?Chief Complaint: SOB, cough ? ?HPI:  ?[name] is an 87 yo female with PMH severe IDD, HTN, dCHF, asthma who presented from her group home with cough and SOB.  She has been intermittently on oxygen.  She was recently taken off of oxygen at her group home as apparently she did not need it anymore.  Today she was apparently found to be hypoxic along with her shortness of breath, cough and was sent to the ER for further evaluation. ?CXR showed possible pulmonary vascular congestion however low lung volumes were noted.  She has chronic atelectasis within the right medial lung base.  CT chest was then performed which was negative for acute airspace opacities.  She has a known large hiatal hernia with compressive atelectasis due to this. ?She was given a dose of Rocephin, azithromycin, nebulizers, Solu-Medrol, and Lasix. ?Last hospitalization in August 2022 for asthma exacerbation also responded well to intermittent Lasix dosing. ? ?I have personally briefly reviewed patient's old medical records in [facility] and discussed patient with the ER provider when appropriate/indicated. ? ?Assessment and Plan: ?* CAP (community acquired pneumonia) ?- possible infiltrate appreciate on CT chest; given WBC elevation, cough, and SOB will treat as possible PNA for now as well ?- continue rocephin and azithro ?- check Strep Ur Ag and Legionella ?- check PCT ?- give dose of lasix and monitor response ? ?AKI (acute kidney injury) (HCC) ?- baseline creatinine ~ 1-1.2 ?- patient presents with increase in creat >0.3 mg/dL above baseline, creat increase >1.5x baseline presumed to have occurred within past 7 days PTA ?- creat 2 on admission ?- trial of lasix with suspicion for possible component of CRS ?- BMP in am ? ? ?Asthma exacerbation ?- continue nebs ?- continue  steroids ? ?Acute respiratory failure with hypoxia (HCC) ?- Appears to be intermittent and has required oxygen at times at her group home.  She was satting 99% on 2 L in the ER ?- Continue weaning oxygen off ? ?Hyperlipidemia ?- Continue pravastatin ? ?Hypothyroidism ?- Continue Synthroid ? ?Type 2 diabetes mellitus (HCC) ?- Continue SSI and CBG monitoring ? ?Hypertension ?- Continue lisinopril ? ?Intellectual disability ?- Resides at a group home.  Unable to provide collateral information.  Sister present on admission and is her POA ? ? ? ? ?  ? ?Code Status: DNR  ?DVT Prophylaxis: Lovenox   ?  ?Anticipated disposition is to: Group home ? ?History: ?Past Medical History:  ?Diagnosis Date  ? Asthma   ? Cognitive developmental delay   ? Epilepsia [facility])   ? Hypertension   ? Mental retardation   ? Osteoporosis   ? ? ?Past Surgical History:  ?Procedure Laterality Date  ? BREAST RECONSTRUCTION    ? DENTAL SURGERY    ? REDUCTION MAMMAPLASTY Bilateral   ? ? ? reports that she has never smoked. She has never used smokeless tobacco. She reports that she does not drink alcohol and does not use drugs. ? ?No Known Allergies ? ?Family History  ?Problem Relation Age of Onset  ? Cancer Mother   ? ? ?Home Medications: ?Prior to Admission medications   ?Medication Sig Start Date End Date Taking? Authorizing Provider  ?acetaminophen (TYLENOL) 500 MG tablet Take 500 mg by mouth every 6 (six) hours as needed for mild pain or headache.   Yes  [provider]  ?Albuterol Sulfate 2.5 MG/0.5ML NEBU Inhale 2.5 mg into the lungs every 6 (six) hours as needed (sob/wheezing).   Yes [provider]  ?AQUEOUS VITAMIN D 10 MCG/ML LIQD Take 300 Units by mouth every evening. 12/24/21  Yes [provider]  ?aspirin 81 MG chewable tablet Chew 81 mg by mouth daily.    Yes [provider]  ?Bismuth Subsalicylate (PEPTO-BISMOL MAX STRENGTH) 525 MG/15ML SUSP Take 5-15 mLs by mouth daily as needed (for indigestion).   Yes  [provider]  ?calamine lotion Apply 1 application topically daily as needed for itching.   Yes [provider]  ?dimenhyDRINATE (DRAMAMINE) 50 MG tablet Take 50 mg by mouth every 8 (eight) hours as needed for dizziness or nausea (as directed).   Yes [provider]  ?diphenhydrAMINE (BENADRYL) 25 MG tablet Take 25 mg by mouth daily as needed for itching.   Yes [provider]  ?DM-Phenylephrine-Acetaminophen (SUDAFED PE PRESSURE+PAIN+COUGH PO) Take 5 mLs by mouth every 6 (six) hours as needed (for symptoms).   Yes [provider]  ?Famotidine (PEPCID PO) Take 1 tablet by mouth daily as needed (indigestion).   Yes [provider]  ?FARXIGA 5 MG TABS tablet Take 5 mg by mouth daily. 12/24/21  Yes [provider]  ?ferrous sulfate 220 (44 FE) MG/5ML solution Take 220 mg by mouth every evening.   Yes [provider]  ?fluticasone (FLOVENT HFA) 110 MCG/ACT inhaler Inhale 2 puffs into the lungs 2 (two) times daily.   Yes [provider]  ?furosemide (LASIX) 20 MG tablet Take 20 mg by mouth 2 (two) times daily. 12/24/21  Yes [provider]  ?guaiFENesin-dextromethorphan (ROBITUSSIN DM) 100-10 MG/5ML syrup Take 10 mLs by mouth every 6 (six) hours as needed for cough. 07/20/19  Yes Arrien, York Ram, MD  ?hydrocortisone cream 1 % Apply 1 application topically daily as needed for itching.   Yes [provider]  ?ibuprofen (ADVIL) 200 MG tablet Take 200 mg by mouth every 6 (six) hours as needed for headache or mild pain.   Yes [provider]  ?levETIRAcetam (KEPPRA) 100 MG/ML solution Take 2.5 mLs by mouth 2 (two) times daily. 06/28/19  Yes [provider]  ?levothyroxine (SYNTHROID, LEVOTHROID) 88 MCG tablet Take 88 mcg by mouth daily before breakfast.   Yes [provider]  ?lisinopril (ZESTRIL) 10 MG tablet Take 10 mg by mouth daily.   Yes [provider]  ?loperamide (IMODIUM A-D) 2  MG tablet Take 2 mg by mouth 4 (four) times daily as needed for diarrhea or loose stools.   Yes [provider]  ?metFORMIN (GLUCOPHAGE-XR) 500 MG 24 hr tablet Take 500 mg by mouth every morning. 12/24/21  Yes [provider]  ?metoprolol tartrate (LOPRESSOR) 25 MG tablet Take 12.5 mg by mouth 2 (two) times daily. 12/24/21  Yes [provider]  ?montelukast (SINGULAIR) 10 MG tablet Take 10 mg by mouth every evening.    Yes [provider]  ?Multiple Vitamins-Minerals (MULTIVITAMIN ADULTS) TABS Take 1 tablet by mouth daily.   Yes [provider]  ?pantoprazole (PROTONIX) 40 MG tablet Take 40 mg by mouth daily.   Yes [provider]  ?potassium chloride (KLOR-CON) 10 MEQ tablet Take 10 mEq by mouth daily. 12/24/21  Yes [provider]  ?pravastatin (PRAVACHOL) 20 MG tablet Take 20 mg by mouth every evening.  06/28/19  Yes [provider]  ?carvedilol (COREG) 3.125 MG tablet Take 1  tablet (3.125 mg total) by mouth 2 (two) times daily with a meal. ?Patient not taking: Reported on 12/28/2021 08/08/15   Hollice EspyKrishnan, Sendil K, MD  ?furosemide (LASIX) 40 MG tablet Take 3 tablets (120 mg total) by mouth daily. 04/16/21 05/16/21  Jerald Kiefhiu, Stephen K, MD  ?lisinopril (ZESTRIL) 10 MG tablet Take 1 tablet (10 mg total) by mouth daily. 04/16/21 05/16/21  Jerald Kiefhiu, Stephen K, MD  ?pantoprazole (PROTONIX) 40 MG tablet Take 1 tablet (40 mg total) by mouth daily. 04/16/21 05/16/21  Jerald Kiefhiu, Stephen K, MD  ? ? ?Review of Systems:  ?Review of Systems  ?Constitutional:  Negative for chills and fever.  ?HENT: Negative.    ?Eyes: Negative.   ?Respiratory:  Positive for cough, shortness of breath and wheezing.   ?Cardiovascular:  Positive for leg swelling.  ?Gastrointestinal: Negative.   ?Genitourinary: Negative.   ?Musculoskeletal: Negative.   ?Skin: Negative.   ?Neurological: Negative.   ?Endo/Heme/Allergies: Negative.   ?Psychiatric/Behavioral: Negative.    ? ?Physical Exam:  ?Vitals:  ?  12/28/21 1258 12/28/21 1259 12/28/21 1302 12/28/21 1516  ?BP:   138/70 138/77  ?Pulse:  88 86 87  ?Resp:  (!) 24 (!) 26 (!) 23  ?Temp:  98.3 ?F (36.8 ?C)    ?TempSrc:  Oral    ?SpO2: 96% 90% 95% 97%  ? ?Physical Exam

## 2021-12-28 NOTE — Assessment & Plan Note (Signed)
Continue pravastatin 

## 2021-12-28 NOTE — Assessment & Plan Note (Addendum)
-   Resides at a group home.  ?

## 2021-12-28 NOTE — Assessment & Plan Note (Addendum)
-   possible infiltrate appreciate on CT chest; given WBC elevation, cough, and SOB will treat as possible PNA for now as well ?- s/p rocephin and azithro; transitioned to amoxicillin and doxy at discharge to complete course ?- check Strep Ur Ag (negative) and Legionella (in process) ?- trend PCT (0.46 >>0.31>>0.24) ?

## 2021-12-28 NOTE — Assessment & Plan Note (Signed)
-   continue nebs ?- continue steroids ?

## 2021-12-28 NOTE — ED Triage Notes (Signed)
Pt BIBA from home. Pt's caregiver noticed SOB, weakness and cough over last 24x hours.  ? ?Hx MR, mentation at baseline.  ? ?BP: 146/82 ?HR: 94 ?SPO2: 94 on 2L ?CBG: 103 ?

## 2021-12-28 NOTE — ED Provider Notes (Signed)
?[facility]-EMERGENCY DEPT ?Provider Note ? ? ?CSN: [id number] ?Arrival date & time: 12/28/21  1237 ? ?  ? ?History ? ?Chief Complaint  ?Patient presents with  ? Shortness of Breath  ? ? ?Leslie Chandler is a 87 y.o. female. ? ?Pt is a 87 yo female with a pmhx significant for MR, epilepsy, htn, dm2, hypothyroidism, ckd, dysphagia with aspiration pna, and chf.  Pt lives in a group home.  Her legal guardian is not her primary caregiver as the pt lives in the group home.  Her guardian gives most of the hx.  Guardian said she was told pt has had worsening sob, wheezing, and cough for the past 24 hrs.  Pt has been wearing oxygen intermittently in the past, but has none available now.  EMS put her on 2L oxygen en route. ? ? ? ?  ? ?Home Medications ?Prior to Admission medications   ?Medication Sig Start Date End Date Taking? Authorizing Provider  ?acetaminophen (TYLENOL) 500 MG tablet Take 500 mg by mouth every 6 (six) hours as needed for mild pain or headache.    [provider]  ?aspirin 81 MG chewable tablet Chew 81 mg by mouth daily.     [provider]  ?Bismuth Subsalicylate (PEPTO-BISMOL MAX STRENGTH) 525 MG/15ML SUSP Take 5-15 mLs by mouth daily as needed (for indigestion).    [provider]  ?calamine lotion Apply 1 application topically daily as needed for itching.    [provider]  ?Calcium Carbonate-Vitamin D (HCA LIQUID CALCIUM/VITAMIN D PO) Take 3 mLs by mouth daily.    [provider]  ?Calcium-Vitamin D-Vitamin K (VIACTIV PO) Take 1 tablet by mouth daily. Chew 1 tablet by mouth in the morning    [provider]  ?carvedilol (COREG) 3.125 MG tablet Take 1 tablet (3.125 mg total) by mouth 2 (two) times daily with a meal. 08/08/15   Hollice Espy, MD  ?dimenhyDRINATE (DRAMAMINE) 50 MG tablet Take 50 mg by mouth every 8 (eight) hours as needed for dizziness or nausea (as directed).    [provider]  ?diphenhydrAMINE  (BENADRYL) 25 MG tablet Take 25 mg by mouth daily as needed for itching (as directed).    [provider]  ?DM-Phenylephrine-Acetaminophen (SUDAFED PE PRESSURE+PAIN+COUGH PO) Take 5 mLs by mouth every 6 (six) hours as needed (for symptoms).    [provider]  ?ferrous sulfate 220 (44 FE) MG/5ML solution Take 176 mg by mouth every evening.     [provider]  ?fluticasone (FLOVENT HFA) 110 MCG/ACT inhaler Inhale 2 puffs into the lungs 2 (two) times daily.    [provider]  ?furosemide (LASIX) 40 MG tablet Take 3 tablets (120 mg total) by mouth daily. 04/16/21 05/16/21  Jerald Kief, MD  ?guaiFENesin-dextromethorphan [facility] DM) 100-10 MG/5ML syrup Take 10 mLs by mouth every 6 (six) hours as needed for cough. 07/20/19   Arrien, York Ram, MD  ?hydrocortisone cream 1 % Apply 1 application topically daily as needed for itching.    [provider]  ?ibuprofen (ADVIL) 200 MG tablet Take 200 mg by mouth every 6 (six) hours as needed for headache or mild pain.    [provider]  ?levETIRAcetam (KEPPRA) 100 MG/ML solution Take 2.5 mLs by mouth 2 (two) times daily. 06/28/19   [provider]  ?levothyroxine (SYNTHROID, LEVOTHROID) 88 MCG tablet Take 88 mcg by mouth daily at 12 noon.     [provider]  ?lisinopril (ZESTRIL)  10 MG tablet Take 1 tablet (10 mg total) by mouth daily. 04/16/21 05/16/21  Jerald Kiefhiu, Stephen K, MD  ?loperamide (IMODIUM A-D) 2 MG tablet Take 2 mg by mouth 4 (four) times daily as needed for diarrhea or loose stools.    [provider]  ?Magnesium Hydroxide (MILK OF MAGNESIA CONCENTRATE) 2400 MG/10ML SUSP Take 5 mLs by mouth daily as needed (constipation).    [provider]  ?metFORMIN (GLUCOPHAGE) 500 MG tablet Take 500 mg by mouth daily.    [provider]  ?montelukast (SINGULAIR) 10 MG tablet Take 10 mg by mouth every evening.     [provider]  ?Neomycin-Bacitracin-Polymyxin  (FIRST AID ANTIBIOTIC) [phone] MG-UNIT OINT Apply 1 application topically daily as needed (as directed- to affected sites).    [provider]  ?Nutritional Supplements (ENSURE ENLIVE PO) Take 237 mLs by mouth 3 (three) times daily.    [provider]  ?Ophthalmic Irrigation Solution (EYE WASH) SOLN Place 1 drop into both eyes daily as needed (irritation).    [provider]  ?pantoprazole (PROTONIX) 40 MG tablet Take 1 tablet (40 mg total) by mouth daily. 04/16/21 05/16/21  Jerald Kiefhiu, Stephen K, MD  ?pravastatin (PRAVACHOL) 20 MG tablet Take 20 mg by mouth every evening.  06/28/19   [provider]  ?Skin Protectants, Misc. (ALOE VESTA PROTECTIVE) OINT Apply 1 application topically daily as needed (as directed- to affected sites).    [provider]  ?SUNSCREENS EX Apply 1 application topically daily as needed (for skin protection).    [provider]  ?   ? ?Allergies    ?Patient has no known allergies.   ? ?Review of Systems   ?Review of Systems  ?Respiratory:  Positive for cough and shortness of breath.   ?All other systems reviewed and are negative. ? ?Physical Exam ?Updated Vital Signs ?BP 138/77 (BP Location: Right Arm)   Pulse 87   Temp 98.3 ?F (36.8 ?C) (Oral)   Resp (!) 23   SpO2 97%  ?Physical Exam ?Vitals and nursing note reviewed.  ?Constitutional:   ?   Appearance: She is well-developed.  ?HENT:  ?   Head: Normocephalic and atraumatic.  ?   Mouth/Throat:  ?   Mouth: Mucous membranes are moist.  ?   Pharynx: Oropharynx is clear.  ?Eyes:  ?   Extraocular Movements: Extraocular movements intact.  ?   Pupils: Pupils are equal, round, and reactive to light.  ?Cardiovascular:  ?   Rate and Rhythm: Normal rate and regular rhythm.  ?Pulmonary:  ?   Breath sounds: Wheezing and rhonchi present.  ?Abdominal:  ?   General: Bowel sounds are normal.  ?   Palpations: Abdomen is soft.  ?Musculoskeletal:     ?   General: Normal range of motion.  ?   Cervical back:  Normal range of motion and neck supple.  ?Skin: ?   General: Skin is warm.  ?   Capillary Refill: Capillary refill takes less than 2 seconds.  ?Neurological:  ?   General: No focal deficit present.  ?   Mental Status: She is alert and oriented to person, place, and time.  ?Psychiatric:     ?   Mood and Affect: Mood normal.     ?   Behavior: Behavior normal.  ? ? ?ED Results / Procedures / Treatments   ?Labs ?(all labs ordered are listed, but only abnormal results are displayed) ?Labs Reviewed  ?CBC - Abnormal; Notable for the following  components:  ?    Result Value  ? WBC 12.2 (*)   ? RBC 3.42 (*)   ? Hemoglobin 10.3 (*)   ? HCT 32.0 (*)   ? All other components within normal limits  ?COMPREHENSIVE METABOLIC PANEL - Abnormal; Notable for the following components:  ? Glucose, Bld 135 (*)   ? BUN 36 (*)   ? Creatinine, Ser 2.00 (*)   ? GFR, Estimated 24 (*)   ? All other components within normal limits  ?BRAIN NATRIURETIC PEPTIDE - Abnormal; Notable for the following components:  ? B Natriuretic Peptide 209.6 (*)   ? All other components within normal limits  ?RESP PANEL BY RT-PCR (FLU A&B, COVID) [id number]  ?LACTIC ACID, PLASMA  ?LACTIC ACID, PLASMA  ?URINALYSIS, ROUTINE W REFLEX MICROSCOPIC  ? ? ?EKG ?EKG Interpretation ? ?Date/Time:  Saturday Dec 28 2021 13:18:24 EDT ?Ventricular Rate:  86 ?PR Interval:  131 ?QRS Duration: 86 ?QT Interval:  398 ?QTC Calculation: 476 ?R Axis:   11 ?Text Interpretation: Sinus rhythm Abnormal R-wave progression, early transition No significant change since last tracing Confirmed by Jacalyn Lefevre [phone]) on 12/28/2021 2:18:04 PM ? ?Radiology ?DG Chest 2 View ? ?Result Date: 12/28/2021 ?CLINICAL DATA:  Shortness of breath, weakness and cough. EXAM: CHEST - 2 VIEW COMPARISON:  04/10/2021 FINDINGS: Stable cardiomediastinal contours. Large hiatal hernia. Lungs are suboptimally inflated. Chronic atelectasis along the right heart border at the right lung base is unchanged from previous exam.  Pulmonary vascular congestion. No definite airspace consolidation, pleural effusion or interstitial edema. IMPRESSION: 1. Low lung volumes. 2. Pulmonary vascular congestion. 3. Unchanged chronic atelectasis within the

## 2021-12-28 NOTE — Assessment & Plan Note (Addendum)
-   Appears to be intermittent and has required oxygen at times at her group home.  She was satting 99% on 2 L in the ER ?- Continue weaning oxygen off; maintained O2 ~93% on room air ?- does have some risk of needing O2 in the future with her underlying hernia and chronic lung compression  ?

## 2021-12-28 NOTE — Assessment & Plan Note (Signed)
Continue Synthroid °

## 2021-12-29 DIAGNOSIS — J9601 Acute respiratory failure with hypoxia: Secondary | ICD-10-CM | POA: Diagnosis not present

## 2021-12-29 DIAGNOSIS — J4531 Mild persistent asthma with (acute) exacerbation: Secondary | ICD-10-CM | POA: Diagnosis not present

## 2021-12-29 DIAGNOSIS — N179 Acute kidney failure, unspecified: Secondary | ICD-10-CM | POA: Diagnosis not present

## 2021-12-29 DIAGNOSIS — J189 Pneumonia, unspecified organism: Secondary | ICD-10-CM | POA: Diagnosis not present

## 2021-12-29 LAB — CBC WITH DIFFERENTIAL/PLATELET
Abs Immature Granulocytes: 0.06 10*3/uL (ref 0.00–0.07)
Basophils Absolute: 0 10*3/uL (ref 0.0–0.1)
Basophils Relative: 0 %
Eosinophils Absolute: 0 10*3/uL (ref 0.0–0.5)
Eosinophils Relative: 0 %
HCT: 32.6 % — ABNORMAL LOW (ref 36.0–46.0)
Hemoglobin: 10.2 g/dL — ABNORMAL LOW (ref 12.0–15.0)
Immature Granulocytes: 1 %
Lymphocytes Relative: 8 %
Lymphs Abs: 0.7 10*3/uL (ref 0.7–4.0)
MCH: 29.5 pg (ref 26.0–34.0)
MCHC: 31.3 g/dL (ref 30.0–36.0)
MCV: 94.2 fL (ref 80.0–100.0)
Monocytes Absolute: 0.1 10*3/uL (ref 0.1–1.0)
Monocytes Relative: 2 %
Neutro Abs: 7.7 10*3/uL (ref 1.7–7.7)
Neutrophils Relative %: 89 %
Platelets: 156 10*3/uL (ref 150–400)
RBC: 3.46 MIL/uL — ABNORMAL LOW (ref 3.87–5.11)
RDW: 13.7 % (ref 11.5–15.5)
WBC: 8.6 10*3/uL (ref 4.0–10.5)
nRBC: 0 % (ref 0.0–0.2)

## 2021-12-29 LAB — BASIC METABOLIC PANEL
Anion gap: 12 (ref 5–15)
BUN: 38 mg/dL — ABNORMAL HIGH (ref 8–23)
CO2: 27 mmol/L (ref 22–32)
Calcium: 9 mg/dL (ref 8.9–10.3)
Chloride: 105 mmol/L (ref 98–111)
Creatinine, Ser: 1.8 mg/dL — ABNORMAL HIGH (ref 0.44–1.00)
GFR, Estimated: 27 mL/min — ABNORMAL LOW (ref >60)
Glucose, Bld: 233 mg/dL — ABNORMAL HIGH (ref 70–99)
Potassium: 3.7 mmol/L (ref 3.5–5.1)
Sodium: 144 mmol/L (ref 135–145)

## 2021-12-29 LAB — RESPIRATORY PANEL BY PCR

## 2021-12-29 LAB — MAGNESIUM: Magnesium: 2.3 mg/dL (ref 1.7–2.4)

## 2021-12-29 LAB — PROCALCITONIN: Procalcitonin: 0.31 ng/mL

## 2021-12-29 LAB — HIV ANTIBODY (ROUTINE TESTING W REFLEX): HIV Screen 4th Generation wRfx: NONREACTIVE

## 2021-12-29 LAB — GLUCOSE, CAPILLARY
Glucose-Capillary: 216 mg/dL — ABNORMAL HIGH (ref 70–99)
Glucose-Capillary: 278 mg/dL — ABNORMAL HIGH (ref 70–99)
Glucose-Capillary: 226 mg/dL — ABNORMAL HIGH (ref 70–99)
Glucose-Capillary: 198 mg/dL — ABNORMAL HIGH (ref 70–99)

## 2021-12-29 MED ORDER — IPRATROPIUM-ALBUTEROL 0.5-2.5 (3) MG/3ML IN SOLN
3.0000 mL | Freq: Three times a day (TID) | RESPIRATORY_TRACT | Status: DC
  Administered 2021-12-29 – 2021-12-30 (×2): 3 mL via RESPIRATORY_TRACT
  Filled 2021-12-29 (×3): qty 3

## 2021-12-29 MED ORDER — FUROSEMIDE 10 MG/ML IJ SOLN
40.0000 mg | Freq: Once | INTRAMUSCULAR | Status: AC
  Administered 2021-12-29: 40 mg via INTRAVENOUS
  Filled 2021-12-29: qty 4

## 2021-12-29 NOTE — TOC Initial Note (Signed)
Transition of Care (TOC) - Initial/Assessment Note  ? ? ?Patient Details  ?Name: Leslie Chandler ?MRN: [id number] ?Date of Birth: 11/06/1934 ? ?Transition of Care (TOC) CM/SW Contact:    ?Darleene Cleaver, LCSW ?Phone Number: ?12/29/2021, 4:22 PM ? ?Clinical Narrative:                 ? ?Patient is a resident at a group home called [facility].  Patient's sister [name] [phone] is the HCPOA, and the group home contact person is Charise Killian, [phone].  Patient is from a group home she has been there several years.  Assessment completed by speaking to patient's sister.  Patient plans to return back to the group home once she is medically ready for discharge.  Per patient's sister, the physician informed her that patient may be ready for discharge tomorrow.  CSW informed sister, it will depend on what the physician thinks.  TOC to continue to follow patient's progress throughout discharge planning.  ? ?Expected Discharge Plan: Group Home ?Barriers to Discharge: Continued Medical Work up ? ? ?Patient Goals and CMS Choice ?Patient states their goals for this hospitalization and ongoing recovery are:: To return to [facility] ?CMS Medicare.gov Compare Post Acute Care list provided to:: Patient Represenative (must comment) ?Choice offered to / list presented to : Sibling, [facility] POA / Guardian ? ?Expected Discharge Plan and Services ?Expected Discharge Plan: Group Home ?  ?  ?Post Acute Care Choice:  (Group home) ?Living arrangements for the past 2 months: Group Home ?                ?  ?  ?  ?  ?  ?  ?  ?  ?  ?  ? ?Prior Living Arrangements/Services ?Living arrangements for the past 2 months: Group Home ?Lives with:: Facility Resident ?Patient language and need for interpreter reviewed:: Yes ?Do you feel safe going back to the place where you live?: Yes      ?Need for Family Participation in Patient Care: Yes (Comment) ?Care giver support system in place?: Yes (comment) ?  ?Criminal Activity/Legal  Involvement Pertinent to Current Situation/Hospitalization: No - Comment as needed ? ?Activities of Daily Living ?Home Assistive Devices/Equipment: Wheelchair ?ADL Screening (condition at time of admission) ?Patient's cognitive ability adequate to safely complete daily activities?: No ?Is the patient deaf or have difficulty hearing?: No ?Does the patient have difficulty seeing, even when wearing glasses/contacts?: No ?Does the patient have difficulty concentrating, remembering, or making decisions?: No ?Patient able to express need for assistance with ADLs?: Yes ?Does the patient have difficulty dressing or bathing?: Yes ?Independently performs ADLs?: No ?Communication: Needs assistance ?Is this a change from baseline?: Pre-admission baseline ?Dressing (OT): Needs assistance ?Is this a change from baseline?: Pre-admission baseline ?Grooming: Needs assistance ?Is this a change from baseline?: Pre-admission baseline ?Feeding: Needs assistance ?Is this a change from baseline?: Pre-admission baseline ?Bathing: Dependent ?Is this a change from baseline?: Pre-admission baseline ?Toileting: Dependent ?Is this a change from baseline?: Pre-admission baseline ?In/Out Bed: Dependent ?Is this a change from baseline?: Pre-admission baseline ?Walks in Home: Dependent ?Is this a change from baseline?: Pre-admission baseline ?Does the patient have difficulty walking or climbing stairs?: Yes ?Weakness of Legs: Both ?Weakness of Arms/Hands: Both ? ?Permission Sought/Granted ?Permission sought to share information with : Facility Medical sales representative, Family Supports, Guardian ?Permission granted to share information with : Yes, Release of Information Signed ? Share Information with NAME: [name],[name] Sister [phone]  [phone]  Anderson,Rhyse  Other   [phone] ? Permission granted to share info w AGENCY: Group Home director ?   ?   ? ?Emotional Assessment ?Appearance:: Appears stated age ?  ?  ?Orientation: :  Oriented to Self, Oriented to Place, Oriented to Situation ?Alcohol / Substance Use: Not Applicable ?Psych Involvement: No (comment) ? ?Admission diagnosis:  Hiatal hernia [K44.9] ?CAP (community acquired pneumonia) [J18.9] ?AKI (acute kidney injury) (HCC) [N17.9] ?Acute bronchitis, unspecified organism [J20.9] ?Patient Active Problem List  ? Diagnosis Date Noted  ? AKI (acute kidney injury) (HCC) 12/28/2021  ? Asthma exacerbation 04/10/2021  ? Pneumonia due to COVID-19 virus 07/16/2019  ? Acute respiratory disease due to COVID-19 virus 07/15/2019  ? Seizure disorder (HCC) 07/15/2019  ? Hypothyroidism 07/15/2019  ? Hyperlipidemia 07/15/2019  ? Chronic diastolic heart failure (HCC) 08/08/2015  ? Aspiration pneumonia (HCC) 08/06/2015  ? Sepsis (HCC) 08/05/2015  ? Diarrhea 08/05/2015  ? Type 2 diabetes mellitus (HCC) 08/05/2015  ? Lactic acidosis 08/05/2015  ? CAP (community acquired pneumonia) 04/12/2014  ? Intellectual disability   ? Hypertension   ? Asthma   ? HCAP (healthcare-associated pneumonia) 04/11/2014  ? ?PCP:  Jordan Hawks, PA-C ?Pharmacy:   ?[facility] [address] - [place], [place] - [address] ?[address] [place] [address] ?Phone: [phone] Fax: [phone] ? ? ? ? ?Social Determinants of Health (SDOH) Interventions ?  ? ?Readmission Risk Interventions ?   ? View : No data to display.  ?  ?  ?  ? ? ? ?

## 2021-12-29 NOTE — Progress Notes (Signed)
?Progress Note ? ? ? ?Leslie Chandler   ?ZOX:[id number]  ?DOB: 1934/10/06  ?DOA: 12/28/2021     1 ?PCP: Jordan HawksGordon, Sarah B, PA-C ? ?Initial CC: SOB, cough ? ?Hospital Course: ?[name] is an 87 yo female with PMH severe IDD, HTN, dCHF, asthma who presented from her group home with cough and SOB.  She has been intermittently on oxygen.  She was recently taken off of oxygen at her group home as apparently she did not need it anymore.  Today she was apparently found to be hypoxic along with her shortness of breath, cough and was sent to the ER for further evaluation. ?CXR showed possible pulmonary vascular congestion however low lung volumes were noted.  She has chronic atelectasis within the right medial lung base.  CT chest was then performed which was negative for acute airspace opacities.  She has a known large hiatal hernia with compressive atelectasis due to this. ?She was given a dose of Rocephin, azithromycin, nebulizers, Solu-Medrol, and Lasix. ?Last hospitalization in August 2022 for asthma exacerbation also responded well to intermittent Lasix dosing. ? ?Interval History:  ?No events overnight.  Seems to be feeling better some this morning.  Sister bedside.  Weaned her off oxygen this morning and maintained adequate saturations on room air. ? ?Assessment and Plan: ?* CAP (community acquired pneumonia) ?- possible infiltrate appreciate on CT chest; given WBC elevation, cough, and SOB will treat as possible PNA for now as well ?- continue rocephin and azithro ?- check Strep Ur Ag (negative) and Legionella (in process) ?- trend PCT (0.46 >>0.31) ?- continue lasix PRN ? ?AKI (acute kidney injury) (HCC) ?- baseline creatinine ~ 1-1.2 ?- patient presents with increase in creat >0.3 mg/dL above baseline, creat increase >1.5x baseline presumed to have occurred within past 7 days PTA ?- creat 2 on admission ?- trial of lasix with suspicion for possible component of CRS ?- creatinine improved with lasix; UOP 1700 cc past 24  hours ?- BMP in am ? ? ?Asthma exacerbation ?- continue nebs ?- continue steroids ? ?Acute respiratory failure with hypoxia (HCC)-resolved as of 12/29/2021 ?- Appears to be intermittent and has required oxygen at times at her group home.  She was satting 99% on 2 L in the ER ?- Continue weaning oxygen off ?- weaned to RA on 5/14; monitor for tolerance on RA ? ?Hyperlipidemia ?- Continue pravastatin ? ?Hypothyroidism ?- Continue Synthroid ? ?Type 2 diabetes mellitus (HCC) ?- Continue SSI and CBG monitoring ? ?Hypertension ?- Continue lisinopril ? ?Intellectual disability ?- Resides at a group home.  Unable to provide collateral information.  Sister present on admission and is her POA ? ? ? ?Old records reviewed in assessment of this patient ? ?Antimicrobials: ?Azithromycin 12/28/2021 >> current ?Rocephin 12/28/2021 >> current ? ?DVT prophylaxis:  ?enoxaparin (LOVENOX) injection 30 mg Start: 12/28/21 2200 ? ? ?Code Status:   Code Status: DNR ? ?Disposition Plan: Back to group home likely Monday ?Status is: Inpatient ? ?Objective: ?Blood pressure 131/65, pulse (!) 103, temperature 98.8 ?F (37.1 ?C), temperature source Oral, resp. rate 18, height 5\' 3"  (1.6 m), weight 76.8 kg, SpO2 93 %.  ?Examination:  ?Physical Exam ?Constitutional:   ?   Appearance: She is not ill-appearing or toxic-appearing.  ?HENT:  ?   Head: Normocephalic and atraumatic.  ?   Mouth/Throat:  ?   Mouth: Mucous membranes are moist.  ?Eyes:  ?   Extraocular Movements: Extraocular movements intact.  ?Cardiovascular:  ?   Rate and  Rhythm: Normal rate and regular rhythm.  ?Pulmonary:  ?   Effort: Pulmonary effort is normal.  ?   Breath sounds: No wheezing.  ?   Comments: Improved breath sounds, no further wheezing ?Abdominal:  ?   General: Bowel sounds are normal. There is no distension.  ?   Palpations: Abdomen is soft.  ?   Tenderness: There is no abdominal tenderness.  ?Musculoskeletal:  ?   Cervical back: Normal range of motion and neck supple.  ?    Comments: 1-2 + B/L LE edema  ?Skin: ?   General: Skin is warm and dry.  ?Neurological:  ?   Mental Status: She is alert. Mental status is at baseline.  ?  ? ?Consultants:  ? ? ?Procedures:  ? ? ?Data Reviewed: ?Results for orders placed or performed during the hospital encounter of 12/28/21 (from the past 24 hour(s))  ?Lactic acid, plasma     Status: None  ? Collection Time: 12/28/21  1:22 PM  ?Result Value Ref Range  ? Lactic Acid, Venous 1.5 0.5 - 1.9 mmol/L  ?CBC     Status: Abnormal  ? Collection Time: 12/28/21  1:22 PM  ?Result Value Ref Range  ? WBC 12.2 (H) 4.0 - 10.5 K/uL  ? RBC 3.42 (L) 3.87 - 5.11 MIL/uL  ? Hemoglobin 10.3 (L) 12.0 - 15.0 g/dL  ? HCT 32.0 (L) 36.0 - 46.0 %  ? MCV 93.6 80.0 - 100.0 fL  ? MCH 30.1 26.0 - 34.0 pg  ? MCHC 32.2 30.0 - 36.0 g/dL  ? RDW 14.4 11.5 - 15.5 %  ? Platelets 155 150 - 400 K/uL  ? nRBC 0.0 0.0 - 0.2 %  ?Comprehensive metabolic panel     Status: Abnormal  ? Collection Time: 12/28/21  1:22 PM  ?Result Value Ref Range  ? Sodium 140 135 - 145 mmol/L  ? Potassium 3.7 3.5 - 5.1 mmol/L  ? Chloride 102 98 - 111 mmol/L  ? CO2 29 22 - 32 mmol/L  ? Glucose, Bld 135 (H) 70 - 99 mg/dL  ? BUN 36 (H) 8 - 23 mg/dL  ? Creatinine, Ser 2.00 (H) 0.44 - 1.00 mg/dL  ? Calcium 9.1 8.9 - 10.3 mg/dL  ? Total Protein 7.4 6.5 - 8.1 g/dL  ? Albumin 3.5 3.5 - 5.0 g/dL  ? AST 16 15 - 41 U/L  ? ALT 14 0 - 44 U/L  ? Alkaline Phosphatase 82 38 - 126 U/L  ? Total Bilirubin 0.8 0.3 - 1.2 mg/dL  ? GFR, Estimated 24 (L) >60 mL/min  ? Anion gap 9 5 - 15  ?Brain natriuretic peptide     Status: Abnormal  ? Collection Time: 12/28/21  1:22 PM  ?Result Value Ref Range  ? B Natriuretic Peptide 209.6 (H) 0.0 - 100.0 pg/mL  ?Procalcitonin - Baseline     Status: None  ? Collection Time: 12/28/21  1:22 PM  ?Result Value Ref Range  ? Procalcitonin 0.46 ng/mL  ?Resp Panel by RT-PCR (Flu A&B, Covid) Nasopharyngeal Swab     Status: None  ? Collection Time: 12/28/21  1:31 PM  ? Specimen: Nasopharyngeal Swab;  Nasopharyngeal(NP) swabs in vial transport medium  ?Result Value Ref Range  ? SARS Coronavirus 2 by RT PCR NEGATIVE NEGATIVE  ? Influenza A by PCR NEGATIVE NEGATIVE  ? Influenza B by PCR NEGATIVE NEGATIVE  ?Respiratory (~20 pathogens) panel by PCR     Status: None  ? Collection Time: 12/28/21  1:31 PM  ?  Specimen: Nasopharyngeal Swab; Respiratory  ?Result Value Ref Range  ? Adenovirus NOT DETECTED NOT DETECTED  ? Coronavirus 229E NOT DETECTED NOT DETECTED  ? Coronavirus HKU1 NOT DETECTED NOT DETECTED  ? Coronavirus NL63 NOT DETECTED NOT DETECTED  ? Coronavirus OC43 NOT DETECTED NOT DETECTED  ? Metapneumovirus NOT DETECTED NOT DETECTED  ? Rhinovirus / Enterovirus NOT DETECTED NOT DETECTED  ? Influenza A NOT DETECTED NOT DETECTED  ? Influenza B NOT DETECTED NOT DETECTED  ? Parainfluenza Virus 1 NOT DETECTED NOT DETECTED  ? Parainfluenza Virus 2 NOT DETECTED NOT DETECTED  ? Parainfluenza Virus 3 NOT DETECTED NOT DETECTED  ? Parainfluenza Virus 4 NOT DETECTED NOT DETECTED  ? Respiratory Syncytial Virus NOT DETECTED NOT DETECTED  ? Bordetella pertussis NOT DETECTED NOT DETECTED  ? Bordetella Parapertussis NOT DETECTED NOT DETECTED  ? Chlamydophila pneumoniae NOT DETECTED NOT DETECTED  ? Mycoplasma pneumoniae NOT DETECTED NOT DETECTED  ?Urinalysis, Routine w reflex microscopic Urine, Catheterized     Status: Abnormal  ? Collection Time: 12/28/21  6:21 PM  ?Result Value Ref Range  ? Color, Urine STRAW (A) YELLOW  ? APPearance CLEAR CLEAR  ? Specific Gravity, Urine 1.003 (L) 1.005 - 1.030  ? pH 7.0 5.0 - 8.0  ? Glucose, UA >=500 (A) NEGATIVE mg/dL  ? Hgb urine dipstick NEGATIVE NEGATIVE  ? Bilirubin Urine NEGATIVE NEGATIVE  ? Ketones, ur NEGATIVE NEGATIVE mg/dL  ? Protein, ur NEGATIVE NEGATIVE mg/dL  ? Nitrite NEGATIVE NEGATIVE  ? Leukocytes,Ua NEGATIVE NEGATIVE  ? RBC / HPF 0-5 0 - 5 RBC/hpf  ? WBC, UA 0-5 0 - 5 WBC/hpf  ? Bacteria, UA RARE (A) NONE SEEN  ? Squamous Epithelial / LPF 0-5 0 - 5  ?Strep pneumoniae urinary  antigen     Status: None  ? Collection Time: 12/28/21  6:21 PM  ?Result Value Ref Range  ? Strep Pneumo Urinary Antigen NEGATIVE NEGATIVE  ?Lactic acid, plasma     Status: None  ? Collection Time: 12/28/21  6:35 PM  ?Res

## 2021-12-30 ENCOUNTER — Other Ambulatory Visit: Payer: Self-pay

## 2021-12-30 ENCOUNTER — Encounter (HOSPITAL_COMMUNITY): Payer: Self-pay | Admitting: Internal Medicine

## 2021-12-30 DIAGNOSIS — J9601 Acute respiratory failure with hypoxia: Secondary | ICD-10-CM | POA: Diagnosis not present

## 2021-12-30 DIAGNOSIS — J189 Pneumonia, unspecified organism: Secondary | ICD-10-CM | POA: Diagnosis not present

## 2021-12-30 DIAGNOSIS — J4531 Mild persistent asthma with (acute) exacerbation: Secondary | ICD-10-CM | POA: Diagnosis not present

## 2021-12-30 DIAGNOSIS — N179 Acute kidney failure, unspecified: Secondary | ICD-10-CM | POA: Diagnosis not present

## 2021-12-30 LAB — BASIC METABOLIC PANEL
Anion gap: 14 (ref 5–15)
BUN: 49 mg/dL — ABNORMAL HIGH (ref 8–23)
CO2: 24 mmol/L (ref 22–32)
Calcium: 9 mg/dL (ref 8.9–10.3)
Chloride: 102 mmol/L (ref 98–111)
Creatinine, Ser: 1.88 mg/dL — ABNORMAL HIGH (ref 0.44–1.00)
GFR, Estimated: 26 mL/min — ABNORMAL LOW (ref >60)
Glucose, Bld: 278 mg/dL — ABNORMAL HIGH (ref 70–99)
Potassium: 3.5 mmol/L (ref 3.5–5.1)
Sodium: 140 mmol/L (ref 135–145)

## 2021-12-30 LAB — PROCALCITONIN: Procalcitonin: 0.24 ng/mL

## 2021-12-30 LAB — CBC WITH DIFFERENTIAL/PLATELET
Abs Immature Granulocytes: 0.16 10*3/uL — ABNORMAL HIGH (ref 0.00–0.07)
Basophils Absolute: 0 10*3/uL (ref 0.0–0.1)
Basophils Relative: 0 %
Eosinophils Absolute: 0 10*3/uL (ref 0.0–0.5)
Eosinophils Relative: 0 %
HCT: 35.3 % — ABNORMAL LOW (ref 36.0–46.0)
Hemoglobin: 11.1 g/dL — ABNORMAL LOW (ref 12.0–15.0)
Immature Granulocytes: 1 %
Lymphocytes Relative: 5 %
Lymphs Abs: 0.7 10*3/uL (ref 0.7–4.0)
MCH: 29.4 pg (ref 26.0–34.0)
MCHC: 31.4 g/dL (ref 30.0–36.0)
MCV: 93.4 fL (ref 80.0–100.0)
Monocytes Absolute: 0.3 10*3/uL (ref 0.1–1.0)
Monocytes Relative: 3 %
Neutro Abs: 12 10*3/uL — ABNORMAL HIGH (ref 1.7–7.7)
Neutrophils Relative %: 91 %
Platelets: 190 10*3/uL (ref 150–400)
RBC: 3.78 MIL/uL — ABNORMAL LOW (ref 3.87–5.11)
RDW: 13.9 % (ref 11.5–15.5)
WBC: 13.2 10*3/uL — ABNORMAL HIGH (ref 4.0–10.5)
nRBC: 0 % (ref 0.0–0.2)

## 2021-12-30 LAB — GLUCOSE, CAPILLARY
Glucose-Capillary: 151 mg/dL — ABNORMAL HIGH (ref 70–99)
Glucose-Capillary: 220 mg/dL — ABNORMAL HIGH (ref 70–99)
Glucose-Capillary: 235 mg/dL — ABNORMAL HIGH (ref 70–99)

## 2021-12-30 LAB — MAGNESIUM: Magnesium: 2.5 mg/dL — ABNORMAL HIGH (ref 1.7–2.4)

## 2021-12-30 MED ORDER — DOXYCYCLINE HYCLATE 100 MG PO CAPS: 100.0000 mg | ORAL_CAPSULE | Freq: Two times a day (BID) | ORAL | 0 refills | Status: AC

## 2021-12-30 MED ORDER — PREDNISONE 20 MG PO TABS: 40.0000 mg | ORAL_TABLET | Freq: Every day | ORAL | 0 refills | Status: AC

## 2021-12-30 MED ORDER — AMOXICILLIN 875 MG PO TABS: 875.0000 mg | ORAL_TABLET | Freq: Two times a day (BID) | ORAL | 0 refills | Status: AC

## 2021-12-30 NOTE — Discharge Summary (Signed)
?Physician Discharge Summary  ? Leslie Chandler [id number] DOB: 1934-10-18 DOA: 12/28/2021 ? ?PCP: Mindi Curling, PA-C ? ?Admit date: 12/28/2021 ?Discharge date: 12/30/2021 ? ?Admitted From: [facility] ?Disposition:  [facility] ?Discharging physician: Dwyane Dee, MD ? ?Recommendations for Outpatient Follow-up:  ?Complete antibiotic and steroid course ?Follow up with primary care in 1-2 weeks, repeat BMP ? ?Home Health:  ?Equipment/Devices:  ? ?Discharge Condition: stable ?CODE STATUS: DNR ?Diet recommendation:  ?Diet Orders (From admission, onward)  ? ?  Start     Ordered  ? 12/30/21 0000  Diet - low sodium heart healthy       ? 12/30/21 1229  ? 12/28/21 1801  DIET DYS 3 Room service appropriate? Yes; Fluid consistency: Thin  Diet effective now       ?Question Answer Comment  ?Room service appropriate? Yes   ?Fluid consistency: Thin   ?  ? 12/28/21 1800  ? ?  ?  ? ?  ? ? ?Hospital Course: ?Ms. Fausey is an 87 yo female with PMH severe IDD, HTN, dCHF, asthma who presented from her group home with cough and SOB.  She has been intermittently on oxygen.  She was recently taken off of oxygen at her group home as she did not need it anymore.  Prior to admission, she was found to be hypoxic along with her shortness of breath, cough and was sent to the ER for further evaluation. ?CXR showed possible pulmonary vascular congestion however low lung volumes were noted.  She has chronic atelectasis within the right medial lung base.  CT chest was then performed which was negative for acute airspace opacities.  She has a known large hiatal hernia with compressive atelectasis due to this. ?She was given a dose of Rocephin, azithromycin, nebulizers, Solu-Medrol, and Lasix. ?Last hospitalization in August 2022 for asthma exacerbation also responded well to intermittent Lasix dosing. ?Overall, she responded well to treatment which included antibiotics, steroids, and lasix. Her procalcitonin was mildly  elevated, so completing an empiric course of antibiotics was considered prudent given her chronic atelectasis and risk of pneumonia. She will complete her remainder course of antibiotics and steroid at discharge.  ?She was weaned off oxygen prior to discharge and had no oxygen desaturation prior to discharge. She still may however need O2 in the future if she does have recurrent hypoxia which could be contributed to from her hernia.  ? ?Assessment and Plan: ?* CAP (community acquired pneumonia) ?- possible infiltrate appreciate on CT chest; given WBC elevation, cough, and SOB will treat as possible PNA for now as well ?- s/p rocephin and azithro; transitioned to amoxicillin and doxy at discharge to complete course ?- check Strep Ur Ag (negative) and Legionella (in process) ?- trend PCT (0.46 >>0.31>>0.24) ? ?AKI (acute kidney injury) ([place]) ?- baseline creatinine ~ 1-1.2 ?- patient presents with increase in creat >0.3 mg/dL above baseline, creat increase >1.5x baseline presumed to have occurred within past 7 days PTA ?- creat 2 on admission ?- trial of lasix with suspicion for possible component of CRS ?- creatinine improved with lasix and had good urine output ? ?Asthma exacerbation ?- continue nebs ?- continue steroids ? ?Acute respiratory failure with hypoxia (HCC)-resolved as of 12/29/2021 ?- Appears to be intermittent and has required oxygen at times at her group home.  She was satting 99% on 2 L in the ER ?- Continue weaning oxygen off; maintained O2 ~93% on room air ?- does have some risk of needing  O2 in the future with her underlying hernia and chronic lung compression  ? ?Hyperlipidemia ?- Continue pravastatin ? ?Hypothyroidism ?- Continue Synthroid ? ?Type 2 diabetes mellitus ([place]) ?- home regimen resumed at discharge  ? ?Hypertension ?- Continue lisinopril ? ?Intellectual disability ?- Resides at a group home.  ? ? ? ? ? ? ?The patient's chronic medical conditions were treated accordingly per the patient's  home medication regimen except as noted.  On day of discharge, patient was felt deemed stable for discharge. Patient/family member advised to call PCP or come back to ER if needed.  ? ?Principal Diagnosis: CAP (community acquired pneumonia) ? ?Discharge Diagnoses: ?Principal Problem: ?  CAP (community acquired pneumonia) ?Active Problems: ?  Asthma exacerbation ?  AKI (acute kidney injury) ([place]) ?  Intellectual disability ?  Hypertension ?  Type 2 diabetes mellitus ([place]) ?  Hypothyroidism ?  Hyperlipidemia ? ? ?Discharge Instructions   ? ? Diet - low sodium heart healthy   Complete by: As directed ?  ? Increase activity slowly   Complete by: As directed ?  ? ?  ? ?Allergies as of 12/30/2021   ?No Known Allergies ?  ? ?  ?Medication List  ?  ? ?STOP taking these medications   ? ?carvedilol 3.125 MG tablet ?Commonly known as: COREG ?  ? ?  ? ?TAKE these medications   ? ?acetaminophen 500 MG tablet ?Commonly known as: TYLENOL ?Take 500 mg by mouth every 6 (six) hours as needed for mild pain or headache. ?  ?Albuterol Sulfate 2.5 MG/0.5ML Nebu ?Inhale 2.5 mg into the lungs every 6 (six) hours as needed (sob/wheezing). ?  ?amoxicillin 875 MG tablet ?Commonly known as: AMOXIL ?Take 1 tablet (875 mg total) by mouth 2 (two) times daily for 3 days. ?  ?Aqueous Vitamin D 10 MCG/ML Liqd ?Generic drug: cholecalciferol ?Take 300 Units by mouth every evening. ?  ?aspirin 81 MG chewable tablet ?Chew 81 mg by mouth daily. ?  ?calamine lotion ?Apply 1 application topically daily as needed for itching. ?  ?dimenhyDRINATE 50 MG tablet ?Commonly known as: DRAMAMINE ?Take 50 mg by mouth every 8 (eight) hours as needed for dizziness or nausea (as directed). ?  ?diphenhydrAMINE 25 MG tablet ?Commonly known as: BENADRYL ?Take 25 mg by mouth daily as needed for itching. ?  ?doxycycline 100 MG capsule ?Commonly known as: VIBRAMYCIN ?Take 1 capsule (100 mg total) by mouth 2 (two) times daily for 3 days. ?  ?Farxiga 5 MG Tabs tablet ?Generic  drug: dapagliflozin propanediol ?Take 5 mg by mouth daily. ?  ?ferrous sulfate 220 (44 Fe) MG/5ML solution ?Take 220 mg by mouth every evening. ?  ?fluticasone 110 MCG/ACT inhaler ?Commonly known as: FLOVENT HFA ?Inhale 2 puffs into the lungs 2 (two) times daily. ?  ?furosemide 20 MG tablet ?Commonly known as: LASIX ?Take 20 mg by mouth 2 (two) times daily. ?What changed: Another medication with the same name was removed. Continue taking this medication, and follow the directions you see here. ?  ?guaiFENesin-dextromethorphan 100-10 MG/5ML syrup ?Commonly known as: ROBITUSSIN DM ?Take 10 mLs by mouth every 6 (six) hours as needed for cough. ?  ?hydrocortisone cream 1 % ?Apply 1 application topically daily as needed for itching. ?  ?ibuprofen 200 MG tablet ?Commonly known as: ADVIL ?Take 200 mg by mouth every 6 (six) hours as needed for headache or mild pain. ?  ?levETIRAcetam 100 MG/ML solution ?Commonly known as: KEPPRA ?Take 2.5 mLs by mouth 2 (two) times  daily. ?  ?levothyroxine 88 MCG tablet ?Commonly known as: SYNTHROID ?Take 88 mcg by mouth daily before breakfast. ?  ?lisinopril 10 MG tablet ?Commonly known as: ZESTRIL ?Take 10 mg by mouth daily. ?What changed: Another medication with the same name was removed. Continue taking this medication, and follow the directions you see here. ?  ?loperamide 2 MG tablet ?Commonly known as: IMODIUM A-D ?Take 2 mg by mouth 4 (four) times daily as needed for diarrhea or loose stools. ?  ?metFORMIN 500 MG 24 hr tablet ?Commonly known as: GLUCOPHAGE-XR ?Take 500 mg by mouth every morning. ?  ?metoprolol tartrate 25 MG tablet ?Commonly known as: LOPRESSOR ?Take 12.5 mg by mouth 2 (two) times daily. ?  ?montelukast 10 MG tablet ?Commonly known as: SINGULAIR ?Take 10 mg by mouth every evening. ?  ?Multivitamin Adults Tabs ?Take 1 tablet by mouth daily. ?  ?pantoprazole 40 MG tablet ?Commonly known as: PROTONIX ?Take 40 mg by mouth daily. ?What changed: Another medication with  the same name was removed. Continue taking this medication, and follow the directions you see here. ?  ?PEPCID PO ?Take 1 tablet by mouth daily as needed (indigestion). ?  ?Pepto-Bismol Max Strength 525

## 2021-12-30 NOTE — TOC Transition Note (Signed)
Transition of Care (TOC) - CM/SW Discharge Note ? ? ?Patient Details  ?Name: Leslie Chandler ?MRN: [id number] ?Date of Birth: 1934/10/23 ? ?Transition of Care (TOC) CM/SW Contact:  ?Shunda Rabadi, LCSW ?Phone Number: ?12/30/2021, 1:11 PM ? ? ?Clinical Narrative:    ?Pt medically cleared for dc today and returning to [facility].  Have alerted sister and group home manager who request [facility] transport back.  [facility] called at 1310.  No further TOC needs. ? ? ?Final next level of care: Group Home ?Barriers to Discharge: Barriers Resolved ? ? ?Patient Goals and CMS Choice ?Patient states their goals for this hospitalization and ongoing recovery are:: To return to [facility] ?CMS Medicare.gov Compare Post Acute Care list provided to:: Patient Represenative (must comment) ?Choice offered to / list presented to : Sibling, [facility] POA / Guardian ? ?Discharge Placement ?  ?           ?  ?Patient to be transferred to facility by: [facility] ?Name of family member notified: sister and group home manager ?Patient and family notified of of transfer: 12/30/21 ? ?Discharge Plan and Services ?  ?  ?Post Acute Care Choice:  (Group home)          ?DME Arranged: N/A ?DME Agency: NA ?  ?  ?  ?  ?  ?  ?  ?  ? ?Social Determinants of Health (SDOH) Interventions ?  ? ? ?Readmission Risk Interventions ?   ? View : No data to display.  ?  ?  ?  ? ? ? ? ? ?

## 2021-12-31 LAB — LEGIONELLA PNEUMOPHILA SEROGP 1 UR AG: L. pneumophila Serogp 1 Ur Ag: NEGATIVE

## 2022-01-22 ENCOUNTER — Encounter (HOSPITAL_COMMUNITY): Payer: Self-pay | Admitting: Emergency Medicine

## 2022-01-22 ENCOUNTER — Other Ambulatory Visit: Payer: Self-pay

## 2022-01-22 ENCOUNTER — Inpatient Hospital Stay (HOSPITAL_COMMUNITY)
Admission: EM | Admit: 2022-01-22 | Discharge: 2022-02-04 | DRG: 291 | Disposition: A | Discharge status: Skilled Nursing Facility | Payer: Medicare Other | Source: Skilled Nursing Facility | Attending: Internal Medicine | Admitting: Internal Medicine

## 2022-01-22 ENCOUNTER — Emergency Department (HOSPITAL_COMMUNITY): Payer: Medicare Other

## 2022-01-22 DIAGNOSIS — R1310 Dysphagia, unspecified: Secondary | ICD-10-CM | POA: Diagnosis present

## 2022-01-22 DIAGNOSIS — I130 Hypertensive heart and chronic kidney disease with heart failure and stage 1 through stage 4 chronic kidney disease, or unspecified chronic kidney disease: Secondary | ICD-10-CM | POA: Diagnosis not present

## 2022-01-22 DIAGNOSIS — R0602 Shortness of breath: Secondary | ICD-10-CM | POA: Diagnosis not present

## 2022-01-22 DIAGNOSIS — M810 Age-related osteoporosis without current pathological fracture: Secondary | ICD-10-CM | POA: Diagnosis present

## 2022-01-22 DIAGNOSIS — R627 Adult failure to thrive: Secondary | ICD-10-CM | POA: Diagnosis present

## 2022-01-22 DIAGNOSIS — E1165 Type 2 diabetes mellitus with hyperglycemia: Secondary | ICD-10-CM | POA: Diagnosis present

## 2022-01-22 DIAGNOSIS — Z993 Dependence on wheelchair: Secondary | ICD-10-CM

## 2022-01-22 DIAGNOSIS — J9601 Acute respiratory failure with hypoxia: Secondary | ICD-10-CM | POA: Diagnosis present

## 2022-01-22 DIAGNOSIS — G40909 Epilepsy, unspecified, not intractable, without status epilepticus: Secondary | ICD-10-CM | POA: Diagnosis present

## 2022-01-22 DIAGNOSIS — Z79899 Other long term (current) drug therapy: Secondary | ICD-10-CM

## 2022-01-22 DIAGNOSIS — R6250 Unspecified lack of expected normal physiological development in childhood: Secondary | ICD-10-CM | POA: Diagnosis present

## 2022-01-22 DIAGNOSIS — Z7982 Long term (current) use of aspirin: Secondary | ICD-10-CM

## 2022-01-22 DIAGNOSIS — I10 Essential (primary) hypertension: Secondary | ICD-10-CM | POA: Diagnosis present

## 2022-01-22 DIAGNOSIS — I5033 Acute on chronic diastolic (congestive) heart failure: Secondary | ICD-10-CM | POA: Diagnosis present

## 2022-01-22 DIAGNOSIS — R413 Other amnesia: Secondary | ICD-10-CM | POA: Diagnosis not present

## 2022-01-22 DIAGNOSIS — K449 Diaphragmatic hernia without obstruction or gangrene: Secondary | ICD-10-CM | POA: Diagnosis present

## 2022-01-22 DIAGNOSIS — T380X5A Adverse effect of glucocorticoids and synthetic analogues, initial encounter: Secondary | ICD-10-CM | POA: Diagnosis present

## 2022-01-22 DIAGNOSIS — E1122 Type 2 diabetes mellitus with diabetic chronic kidney disease: Secondary | ICD-10-CM | POA: Diagnosis present

## 2022-01-22 DIAGNOSIS — N1832 Chronic kidney disease, stage 3b: Secondary | ICD-10-CM | POA: Diagnosis present

## 2022-01-22 DIAGNOSIS — Z79890 Hormone replacement therapy (postmenopausal): Secondary | ICD-10-CM

## 2022-01-22 DIAGNOSIS — E785 Hyperlipidemia, unspecified: Secondary | ICD-10-CM | POA: Diagnosis present

## 2022-01-22 DIAGNOSIS — J45901 Unspecified asthma with (acute) exacerbation: Secondary | ICD-10-CM | POA: Diagnosis present

## 2022-01-22 DIAGNOSIS — J45909 Unspecified asthma, uncomplicated: Secondary | ICD-10-CM | POA: Diagnosis present

## 2022-01-22 DIAGNOSIS — R5381 Other malaise: Secondary | ICD-10-CM | POA: Diagnosis present

## 2022-01-22 DIAGNOSIS — Z6827 Body mass index (BMI) 27.0-27.9, adult: Secondary | ICD-10-CM

## 2022-01-22 DIAGNOSIS — Z751 Person awaiting admission to adequate facility elsewhere: Secondary | ICD-10-CM

## 2022-01-22 DIAGNOSIS — Z66 Do not resuscitate: Secondary | ICD-10-CM | POA: Diagnosis present

## 2022-01-22 DIAGNOSIS — E039 Hypothyroidism, unspecified: Secondary | ICD-10-CM | POA: Diagnosis present

## 2022-01-22 DIAGNOSIS — Z7984 Long term (current) use of oral hypoglycemic drugs: Secondary | ICD-10-CM

## 2022-01-22 DIAGNOSIS — Z809 Family history of malignant neoplasm, unspecified: Secondary | ICD-10-CM

## 2022-01-22 DIAGNOSIS — F79 Unspecified intellectual disabilities: Secondary | ICD-10-CM | POA: Diagnosis present

## 2022-01-22 DIAGNOSIS — N183 Chronic kidney disease, stage 3 unspecified: Secondary | ICD-10-CM

## 2022-01-22 DIAGNOSIS — K219 Gastro-esophageal reflux disease without esophagitis: Secondary | ICD-10-CM | POA: Diagnosis present

## 2022-01-22 DIAGNOSIS — R339 Retention of urine, unspecified: Secondary | ICD-10-CM | POA: Diagnosis not present

## 2022-01-22 LAB — COMPREHENSIVE METABOLIC PANEL
ALT: 15 U/L (ref 0–44)
AST: 18 U/L (ref 15–41)
Albumin: 3.8 g/dL (ref 3.5–5.0)
Alkaline Phosphatase: 103 U/L (ref 38–126)
Anion gap: 10 (ref 5–15)
BUN: 21 mg/dL (ref 8–23)
CO2: 27 mmol/L (ref 22–32)
Calcium: 8.9 mg/dL (ref 8.9–10.3)
Chloride: 104 mmol/L (ref 98–111)
Creatinine, Ser: 1.43 mg/dL — ABNORMAL HIGH (ref 0.44–1.00)
GFR, Estimated: 36 mL/min — ABNORMAL LOW (ref >60)
Glucose, Bld: 159 mg/dL — ABNORMAL HIGH (ref 70–99)
Potassium: 3.9 mmol/L (ref 3.5–5.1)
Sodium: 141 mmol/L (ref 135–145)
Total Bilirubin: 0.7 mg/dL (ref 0.3–1.2)
Total Protein: 7.6 g/dL (ref 6.5–8.1)

## 2022-01-22 LAB — CBC WITH DIFFERENTIAL/PLATELET
Abs Immature Granulocytes: 0.01 10*3/uL (ref 0.00–0.07)
Basophils Absolute: 0 10*3/uL (ref 0.0–0.1)
Basophils Relative: 0 %
Eosinophils Absolute: 0.2 10*3/uL (ref 0.0–0.5)
Eosinophils Relative: 3 %
HCT: 38.2 % (ref 36.0–46.0)
Hemoglobin: 12.2 g/dL (ref 12.0–15.0)
Immature Granulocytes: 0 %
Lymphocytes Relative: 20 %
Lymphs Abs: 1.1 10*3/uL (ref 0.7–4.0)
MCH: 29.7 pg (ref 26.0–34.0)
MCHC: 31.9 g/dL (ref 30.0–36.0)
MCV: 92.9 fL (ref 80.0–100.0)
Monocytes Absolute: 1 10*3/uL (ref 0.1–1.0)
Monocytes Relative: 17 %
Neutro Abs: 3.2 10*3/uL (ref 1.7–7.7)
Neutrophils Relative %: 60 %
Platelets: 122 10*3/uL — ABNORMAL LOW (ref 150–400)
RBC: 4.11 MIL/uL (ref 3.87–5.11)
RDW: 14.3 % (ref 11.5–15.5)
WBC: 5.5 10*3/uL (ref 4.0–10.5)
nRBC: 0 % (ref 0.0–0.2)

## 2022-01-22 LAB — TROPONIN I (HIGH SENSITIVITY)
Troponin I (High Sensitivity): 8 ng/L (ref <18)
Troponin I (High Sensitivity): 7 ng/L (ref <18)

## 2022-01-22 LAB — BRAIN NATRIURETIC PEPTIDE: B Natriuretic Peptide: 95.2 pg/mL (ref 0.0–100.0)

## 2022-01-22 MED ORDER — PRAVASTATIN SODIUM 20 MG PO TABS
20.0000 mg | ORAL_TABLET | Freq: Every evening | ORAL | Status: DC
  Administered 2022-01-23 – 2022-01-24 (×2): 20 mg via ORAL
  Filled 2022-01-22 (×2): qty 1

## 2022-01-22 MED ORDER — ALBUTEROL SULFATE (2.5 MG/3ML) 0.083% IN NEBU: 2.5000 mg | INHALATION_SOLUTION | RESPIRATORY_TRACT | Status: DC | PRN

## 2022-01-22 MED ORDER — ENOXAPARIN SODIUM 40 MG/0.4ML IJ SOSY
40.0000 mg | PREFILLED_SYRINGE | INTRAMUSCULAR | Status: DC
  Administered 2022-01-23: 40 mg via SUBCUTANEOUS
  Filled 2022-01-22: qty 0.4

## 2022-01-22 MED ORDER — LEVETIRACETAM 100 MG/ML PO SOLN
250.0000 mg | Freq: Two times a day (BID) | ORAL | Status: DC
  Administered 2022-01-23 – 2022-01-25 (×6): 250 mg via ORAL
  Filled 2022-01-22 (×8): qty 2.5

## 2022-01-22 MED ORDER — FERROUS SULFATE 300 (60 FE) MG/5ML PO SYRP
220.0000 mg | ORAL_SOLUTION | Freq: Every evening | ORAL | Status: DC
Start: 2022-01-22 — End: 2022-01-24
  Administered 2022-01-23: 220 mg via ORAL
  Filled 2022-01-22 (×3): qty 5

## 2022-01-22 MED ORDER — MONTELUKAST SODIUM 10 MG PO TABS
10.0000 mg | ORAL_TABLET | Freq: Every evening | ORAL | Status: DC
  Administered 2022-01-23 – 2022-01-24 (×2): 10 mg via ORAL
  Filled 2022-01-22 (×4): qty 1

## 2022-01-22 MED ORDER — BUDESONIDE 0.25 MG/2ML IN SUSP
0.2500 mg | Freq: Two times a day (BID) | RESPIRATORY_TRACT | Status: DC
  Administered 2022-01-23 – 2022-02-04 (×24): 0.25 mg via RESPIRATORY_TRACT
  Filled 2022-01-22 (×25): qty 2

## 2022-01-22 MED ORDER — METHYLPREDNISOLONE SODIUM SUCC 125 MG IJ SOLR
125.0000 mg | Freq: Once | INTRAMUSCULAR | Status: AC
  Administered 2022-01-22: 125 mg via INTRAVENOUS
  Filled 2022-01-22: qty 2

## 2022-01-22 MED ORDER — HYDRALAZINE HCL 20 MG/ML IJ SOLN: 10.0000 mg | Freq: Three times a day (TID) | INTRAMUSCULAR | Status: DC | PRN

## 2022-01-22 MED ORDER — PANTOPRAZOLE SODIUM 40 MG PO TBEC
40.0000 mg | DELAYED_RELEASE_TABLET | Freq: Every day | ORAL | Status: DC
Start: 2022-01-23 — End: 2022-01-24
  Administered 2022-01-23 – 2022-01-24 (×2): 40 mg via ORAL
  Filled 2022-01-22 (×2): qty 1

## 2022-01-22 MED ORDER — ACETAMINOPHEN 500 MG PO TABS: 500.0000 mg | ORAL_TABLET | Freq: Four times a day (QID) | ORAL | Status: DC | PRN

## 2022-01-22 MED ORDER — INSULIN ASPART 100 UNIT/ML IJ SOLN
0.0000 [IU] | Freq: Three times a day (TID) | INTRAMUSCULAR | Status: DC
  Administered 2022-01-23 – 2022-01-24 (×3): 2 [IU] via SUBCUTANEOUS
  Administered 2022-01-24: 7 [IU] via SUBCUTANEOUS
  Administered 2022-01-24 – 2022-01-25 (×2): 2 [IU] via SUBCUTANEOUS
  Administered 2022-01-25: 1 [IU] via SUBCUTANEOUS
  Administered 2022-01-25 – 2022-01-26 (×2): 5 [IU] via SUBCUTANEOUS
  Administered 2022-01-27 – 2022-01-28 (×3): 3 [IU] via SUBCUTANEOUS
  Administered 2022-01-28: 1 [IU] via SUBCUTANEOUS
  Filled 2022-01-22: qty 0.09

## 2022-01-22 MED ORDER — DIMENHYDRINATE 50 MG PO TABS: 50.0000 mg | ORAL_TABLET | Freq: Three times a day (TID) | ORAL | Status: DC | PRN

## 2022-01-22 MED ORDER — GLUCERNA SHAKE PO LIQD
237.0000 mL | Freq: Two times a day (BID) | ORAL | Status: DC
  Administered 2022-01-23 – 2022-02-04 (×12): 237 mL via ORAL
  Filled 2022-01-22 (×26): qty 237

## 2022-01-22 MED ORDER — BISMUTH SUBSALICYLATE 262 MG/15ML PO SUSP: 5.0000 mL | Freq: Every day | ORAL | Status: DC | PRN

## 2022-01-22 MED ORDER — METOPROLOL TARTRATE 25 MG PO TABS
12.5000 mg | ORAL_TABLET | Freq: Two times a day (BID) | ORAL | Status: DC
Start: 2022-01-22 — End: 2022-01-23
  Administered 2022-01-23: 12.5 mg via ORAL
  Filled 2022-01-22: qty 1

## 2022-01-22 MED ORDER — DIPHENHYDRAMINE HCL 25 MG PO CAPS: 50.0000 mg | ORAL_CAPSULE | Freq: Three times a day (TID) | ORAL | Status: DC | PRN

## 2022-01-22 MED ORDER — LEVOTHYROXINE SODIUM 88 MCG PO TABS: 88.0000 ug | ORAL_TABLET | Freq: Every day | ORAL | Status: DC

## 2022-01-22 MED ORDER — GUAIFENESIN 100 MG/5ML PO LIQD
5.0000 mL | Freq: Three times a day (TID) | ORAL | Status: DC
  Administered 2022-01-23 – 2022-02-04 (×35): 5 mL via ORAL
  Filled 2022-01-22 (×35): qty 10

## 2022-01-22 MED ORDER — LEVOTHYROXINE SODIUM 88 MCG PO TABS
88.0000 ug | ORAL_TABLET | Freq: Every day | ORAL | Status: DC
  Administered 2022-01-24 – 2022-01-25 (×2): 88 ug via ORAL
  Filled 2022-01-22 (×2): qty 1

## 2022-01-22 MED ORDER — DIPHENHYDRAMINE HCL 50 MG/ML IJ SOLN
12.5000 mg | Freq: Once | INTRAMUSCULAR | Status: AC | PRN
  Administered 2022-01-22: 12.5 mg via INTRAVENOUS
  Filled 2022-01-22: qty 1

## 2022-01-22 MED ORDER — FLUTICASONE PROPIONATE HFA 110 MCG/ACT IN AERO: 2.0000 | INHALATION_SPRAY | Freq: Two times a day (BID) | RESPIRATORY_TRACT | Status: DC

## 2022-01-22 MED ORDER — FUROSEMIDE 10 MG/ML IJ SOLN
40.0000 mg | Freq: Two times a day (BID) | INTRAMUSCULAR | Status: DC
  Administered 2022-01-23 – 2022-01-24 (×3): 40 mg via INTRAVENOUS
  Filled 2022-01-22 (×3): qty 4

## 2022-01-22 MED ORDER — ALBUTEROL SULFATE HFA 108 (90 BASE) MCG/ACT IN AERS: 2.0000 | INHALATION_SPRAY | RESPIRATORY_TRACT | Status: DC | PRN

## 2022-01-22 MED ORDER — METHYLPREDNISOLONE SODIUM SUCC 40 MG IJ SOLR
40.0000 mg | Freq: Two times a day (BID) | INTRAMUSCULAR | Status: DC
  Administered 2022-01-23 – 2022-01-24 (×3): 40 mg via INTRAVENOUS
  Filled 2022-01-22 (×3): qty 1

## 2022-01-22 MED ORDER — IPRATROPIUM-ALBUTEROL 0.5-2.5 (3) MG/3ML IN SOLN
3.0000 mL | Freq: Once | RESPIRATORY_TRACT | Status: AC
  Administered 2022-01-22: 3 mL via RESPIRATORY_TRACT
  Filled 2022-01-22: qty 3

## 2022-01-22 MED ORDER — FUROSEMIDE 10 MG/ML IJ SOLN
40.0000 mg | Freq: Once | INTRAMUSCULAR | Status: AC
  Administered 2022-01-22: 40 mg via INTRAVENOUS
  Filled 2022-01-22: qty 4

## 2022-01-22 NOTE — ED Notes (Signed)
Pt started coughing while family was feeding her applesauce. Informed family to not give her anything PO. Admitting team notified.

## 2022-01-22 NOTE — ED Provider Notes (Addendum)
[facility] DEPT Provider Note   CSN: [id number] Arrival date & time: 01/22/22  1440     History  Chief Complaint  Patient presents with   Shortness of Breath   Wheezing    Leslie Chandler is a 87 y.o. female.  HPI  87 year old female with past medical history of MR, HTN, asthma presents to the emergency department with shortness of breath, wheezing and hypoxia.  Patient was recently admitted for asthma exacerbation/pneumonia.  Treated aggressively.  It appears at 1 point the patient did require supplemental oxygen however recently has been weaned to room air.  She was discharged recently with no oxygen requirement.  Presenting today with reoccurring shortness of breath, wheezing and hypoxia. Patient complains of chest discomfort but is otherwise a poor historian.  No report of fever at facility.  It appears that she is otherwise been compliant with her medications.  Home Medications Prior to Admission medications   Medication Sig Start Date End Date Taking? Authorizing Provider  acetaminophen (TYLENOL) 500 MG tablet Take 500 mg by mouth every 6 (six) hours as needed for mild pain or headache.    [provider]  Albuterol Sulfate 2.5 MG/0.5ML NEBU Inhale 2.5 mg into the lungs every 6 (six) hours as needed (sob/wheezing).    [provider]  AQUEOUS VITAMIN D 10 MCG/ML LIQD Take 300 Units by mouth every evening. 12/24/21   [provider]  aspirin 81 MG chewable tablet Chew 81 mg by mouth daily.     [provider]  Bismuth Subsalicylate (PEPTO-BISMOL MAX STRENGTH) 525 MG/15ML SUSP Take 5-15 mLs by mouth daily as needed (for indigestion).    [provider]  calamine lotion Apply 1 application topically daily as needed for itching.    [provider]  dimenhyDRINATE (DRAMAMINE) 50 MG tablet Take 50 mg by mouth every 8 (eight) hours as needed for dizziness or nausea (as directed).    [provider]   diphenhydrAMINE (BENADRYL) 25 MG tablet Take 25 mg by mouth daily as needed for itching.    [provider]  DM-Phenylephrine-Acetaminophen (SUDAFED PE PRESSURE+PAIN+COUGH PO) Take 5 mLs by mouth every 6 (six) hours as needed (for symptoms).    [provider]  Famotidine (PEPCID PO) Take 1 tablet by mouth daily as needed (indigestion).    [provider]  FARXIGA 5 MG TABS tablet Take 5 mg by mouth daily. 12/24/21   [provider]  ferrous sulfate 220 (44 FE) MG/5ML solution Take 220 mg by mouth every evening.    [provider]  fluticasone (FLOVENT HFA) 110 MCG/ACT inhaler Inhale 2 puffs into the lungs 2 (two) times daily.    [provider]  furosemide (LASIX) 20 MG tablet Take 20 mg by mouth 2 (two) times daily. 12/24/21   [provider]  guaiFENesin-dextromethorphan (ROBITUSSIN DM) 100-10 MG/5ML syrup Take 10 mLs by mouth every 6 (six) hours as needed for cough. 07/20/19   Arrien, Jimmy Picket, MD  hydrocortisone cream 1 % Apply 1 application topically daily as needed for itching.    [provider]  ibuprofen (ADVIL) 200 MG tablet Take 200 mg by mouth every 6 (six) hours as needed for headache or mild pain.    [provider]  levETIRAcetam (KEPPRA) 100 MG/ML solution Take 2.5 mLs by mouth 2 (two) times daily. 06/28/19   [provider]  levothyroxine (SYNTHROID, LEVOTHROID) 88 MCG tablet Take 88 mcg by mouth daily before breakfast.  [provider]  lisinopril (ZESTRIL) 10 MG tablet Take 10 mg by mouth daily.    [provider]  loperamide (IMODIUM A-D) 2 MG tablet Take 2 mg by mouth 4 (four) times daily as needed for diarrhea or loose stools.    [provider]  metFORMIN (GLUCOPHAGE-XR) 500 MG 24 hr tablet Take 500 mg by mouth every morning. 12/24/21   [provider]  metoprolol tartrate (LOPRESSOR) 25 MG tablet Take 12.5 mg by mouth 2 (two) times daily. 12/24/21    [provider]  montelukast (SINGULAIR) 10 MG tablet Take 10 mg by mouth every evening.     [provider]  Multiple Vitamins-Minerals (MULTIVITAMIN ADULTS) TABS Take 1 tablet by mouth daily.    [provider]  pantoprazole (PROTONIX) 40 MG tablet Take 40 mg by mouth daily.    [provider]  potassium chloride (KLOR-CON) 10 MEQ tablet Take 10 mEq by mouth daily. 12/24/21   [provider]  pravastatin (PRAVACHOL) 20 MG tablet Take 20 mg by mouth every evening.  06/28/19   [provider]      Allergies    Patient has no known allergies.    Review of Systems   Review of Systems  Unable to perform ROS: Other (MR)   Physical Exam Updated Vital Signs BP (!) 161/106   Pulse 99   Temp 98 F (36.7 C) (Oral)   Resp (!) 23   SpO2 100%  Physical Exam Vitals and nursing note reviewed.  Constitutional:      Appearance: Normal appearance. She is ill-appearing.  HENT:     Head: Normocephalic.     Mouth/Throat:     Mouth: Mucous membranes are moist.  Cardiovascular:     Rate and Rhythm: Tachycardia present.  Pulmonary:     Effort: Pulmonary effort is normal. Tachypnea present. No respiratory distress.     Breath sounds: Examination of the right-lower field reveals decreased breath sounds. Examination of the left-lower field reveals decreased breath sounds. Decreased breath sounds, wheezing and rales present.  Chest:     Chest wall: No tenderness or crepitus.  Abdominal:     Palpations: Abdomen is soft.     Tenderness: There is no abdominal tenderness.  Skin:    General: Skin is warm.  Neurological:     Mental Status: She is alert and oriented to person, place, and time. Mental status is at baseline.  Psychiatric:        Mood and Affect: Mood normal.    ED Results / Procedures / Treatments   Labs (all labs ordered are listed, but only abnormal results are displayed) Labs Reviewed  CBC WITH DIFFERENTIAL/PLATELET - Abnormal;  Notable for the following components:      Result Value   Platelets 122 (*)    All other components within normal limits  COMPREHENSIVE METABOLIC PANEL  BRAIN NATRIURETIC PEPTIDE  TROPONIN I (HIGH SENSITIVITY)    EKG EKG Interpretation  Date/Time:  Wednesday January 22 2022 14:50:24 EDT Ventricular Rate:  103 PR Interval:  140 QRS Duration: 76 QT Interval:  343 QTC Calculation: 449 R Axis:   22 Text Interpretation: Sinus tachycardia Minimal ST depression, lateral leads Confirmed by Lavenia Atlas [phone]) on 01/22/2022 4:34:09 PM  Radiology DG Chest 2 View  Result Date: 01/22/2022 CLINICAL DATA:  Shortness of breath EXAM: CHEST - 2 VIEW COMPARISON:  12/28/2021 FINDINGS: Bibasilar atelectasis. No focal consolidation. No pleural effusion or pneumothorax. Heart and mediastinal contours are unremarkable. Large  hiatal hernia. No acute osseous abnormality. IMPRESSION: 1. No active cardiopulmonary disease. Electronically Signed   By: Kathreen Devoid M.D.   On: 01/22/2022 15:33    Procedures .Critical Care Performed by: Lorelle Gibbs, DO Authorized by: Lorelle Gibbs, DO   Critical care provider statement:    Critical care time (minutes):  30   Critical care time was exclusive of:  Separately billable procedures and treating other patients   Critical care was necessary to treat or prevent imminent or life-threatening deterioration of the following conditions:  Respiratory failure   Critical care was time spent personally by me on the following activities:  Development of treatment plan with patient or surrogate, discussions with consultants, evaluation of patient's response to treatment, examination of patient, ordering and review of laboratory studies, ordering and review of radiographic studies, ordering and performing treatments and interventions, pulse oximetry, re-evaluation of patient's condition and review of old charts    Medications Ordered in ED Medications  albuterol (VENTOLIN  HFA) 108 (90 Base) MCG/ACT inhaler 2 puff (has no administration in time range)  ipratropium-albuterol (DUONEB) 0.5-2.5 (3) MG/3ML nebulizer solution 3 mL (3 mLs Nebulization Given 01/22/22 1614)  methylPREDNISolone sodium succinate (SOLU-MEDROL) 125 mg/2 mL injection 125 mg (125 mg Intravenous Given 01/22/22 1604)    ED Course/ Medical Decision Making/ A&P                           Medical Decision Making Amount and/or Complexity of Data Reviewed Labs: ordered. Radiology: ordered.  Risk Prescription drug management.   87 year old female presents emergency department shortness of breath, wheezing and hypoxia.  Reportedly was discharged on room air without any oxygen requirement, currently on 2 L nasal cannula with normal oxygenation.  She is complaining of mild chest pain but otherwise a poor historian secondary to MR/HOH.  EKG shows tachycardia, blood pressure is stable.  Chest x-ray shows no acute findings.  On lung auscultation she has diffuse wheezes and scattered rales.  Will be treated with DuoNeb and steroids.  Last admission she was also treated with diuresis and benefited.  She is pending chemistry, troponin, BNP.  Consideration can be made for evaluation of PE as well but pending kidney dysfunction. Patient signed out to Dr. Armandina Gemma.        Final Clinical Impression(s) / ED Diagnoses Final diagnoses:  None    Rx / DC Orders ED Discharge Orders     None         Lorelle Gibbs, DO 01/22/22 1701    Ifrah Vest, Alvin Critchley, DO 01/22/22 1702

## 2022-01-22 NOTE — H&P (Signed)
History and Physical    Leslie Chandler [id number] DOB: November 07, 1934 DOA: 01/22/2022  PCP: Mindi Curling, PA-C Patient coming from: group home  I have personally briefly reviewed patient's old medical records in [place]  Chief Complaint: sob/ wheezing  HPI: Leslie Chandler is a 87 y.o. female with medical history of intellectual disability from a group home  , baseline wheelchair-bound , with h/o seizure on Keppra , has been seizure-free , hypothyroidism ,HTN, HLD, NIDDM2, dCHF on lasix , CKDIII, h/o large hiatal hernia, not a candidate for surgical correction ( per sister), patient was diagnosed with asthma a few years ago, she has had multiple admission for pneumonia and asthma exacerbation over the years, most recently a few weeks ago in may is sent from group home due to wheezing, coughing and hypoxia   ED Course:  Data reviewed: Blood pressure (!) 161/106, pulse 99, temperature 98 F (36.7 C), temperature source Oral, resp. rate (!) 23, 02 sats 86% on room air, she is put on 2liter oxygen supplement with improvement of SpO2 90-100 %.  EKG: Independently reviewed.  Sinus tachycardia, no ST elevation, QTc unremarkable Portable Cxr no acute findings  Wbc 5.5 Cr 1.4 ( close to baseline) Glucose 159 Bnp 95.2 High sensitive troponin 8   She is given Solu-Medrol and nebulizer treatment, hospitalist called to further manage the patient    Review of Systems: As per HPI otherwise all other systems reviewed and are negative.   Past Medical History:  Diagnosis Date   Asthma    Cognitive developmental delay    Epilepsia [facility])    Hypertension    Mental retardation    Osteoporosis     Past Surgical History:  Procedure Laterality Date   BREAST RECONSTRUCTION     DENTAL SURGERY     REDUCTION MAMMAPLASTY Bilateral     Social History  reports that she has never smoked. She has never used smokeless tobacco. She reports that she does not drink alcohol and does not use  drugs.  No Known Allergies  Family History  Problem Relation Age of Onset   Cancer Mother     Prior to Admission medications   Medication Sig Start Date End Date Taking? Authorizing Provider  acetaminophen (TYLENOL) 500 MG tablet Take 500 mg by mouth every 6 (six) hours as needed for mild pain or headache.    [provider]  Albuterol Sulfate 2.5 MG/0.5ML NEBU Inhale 2.5 mg into the lungs every 6 (six) hours as needed (sob/wheezing).    [provider]  AQUEOUS VITAMIN D 10 MCG/ML LIQD Take 300 Units by mouth every evening. 12/24/21   [provider]  aspirin 81 MG chewable tablet Chew 81 mg by mouth daily.     [provider]  Bismuth Subsalicylate (PEPTO-BISMOL MAX STRENGTH) 525 MG/15ML SUSP Take 5-15 mLs by mouth daily as needed (for indigestion).    [provider]  calamine lotion Apply 1 application topically daily as needed for itching.    [provider]  dimenhyDRINATE (DRAMAMINE) 50 MG tablet Take 50 mg by mouth every 8 (eight) hours as needed for dizziness or nausea (as directed).    [provider]  diphenhydrAMINE (BENADRYL) 25 MG tablet Take 25 mg by mouth daily as needed for itching.    [provider]  DM-Phenylephrine-Acetaminophen (SUDAFED PE PRESSURE+PAIN+COUGH PO) Take 5 mLs by mouth every 6 (six) hours as needed (for symptoms).    [provider]  Famotidine (PEPCID PO) Take 1  tablet by mouth daily as needed (indigestion).    [provider]  FARXIGA 5 MG TABS tablet Take 5 mg by mouth daily. 12/24/21   [provider]  ferrous sulfate 220 (44 FE) MG/5ML solution Take 220 mg by mouth every evening.    [provider]  fluticasone (FLOVENT HFA) 110 MCG/ACT inhaler Inhale 2 puffs into the lungs 2 (two) times daily.    [provider]  furosemide (LASIX) 20 MG tablet Take 20 mg by mouth 2 (two) times daily. 12/24/21   [provider]   guaiFENesin-dextromethorphan (ROBITUSSIN DM) 100-10 MG/5ML syrup Take 10 mLs by mouth every 6 (six) hours as needed for cough. 07/20/19   Arrien, Jimmy Picket, MD  hydrocortisone cream 1 % Apply 1 application topically daily as needed for itching.    [provider]  ibuprofen (ADVIL) 200 MG tablet Take 200 mg by mouth every 6 (six) hours as needed for headache or mild pain.    [provider]  levETIRAcetam (KEPPRA) 100 MG/ML solution Take 2.5 mLs by mouth 2 (two) times daily. 06/28/19   [provider]  levothyroxine (SYNTHROID, LEVOTHROID) 88 MCG tablet Take 88 mcg by mouth daily before breakfast.    [provider]  lisinopril (ZESTRIL) 10 MG tablet Take 10 mg by mouth daily.    [provider]  loperamide (IMODIUM A-D) 2 MG tablet Take 2 mg by mouth 4 (four) times daily as needed for diarrhea or loose stools.    [provider]  metFORMIN (GLUCOPHAGE-XR) 500 MG 24 hr tablet Take 500 mg by mouth every morning. 12/24/21   [provider]  metoprolol tartrate (LOPRESSOR) 25 MG tablet Take 12.5 mg by mouth 2 (two) times daily. 12/24/21   [provider]  montelukast (SINGULAIR) 10 MG tablet Take 10 mg by mouth every evening.     [provider]  Multiple Vitamins-Minerals (MULTIVITAMIN ADULTS) TABS Take 1 tablet by mouth daily.    [provider]  pantoprazole (PROTONIX) 40 MG tablet Take 40 mg by mouth daily.    [provider]  potassium chloride (KLOR-CON) 10 MEQ tablet Take 10 mEq by mouth daily. 12/24/21   [provider]  pravastatin (PRAVACHOL) 20 MG tablet Take 20 mg by mouth every evening.  06/28/19   [provider]    Physical Exam: Vitals:   01/22/22 1503 01/22/22 1545 01/22/22 1620 01/22/22 1805  BP: (!) 162/95 (!) 157/96 (!) 161/106 (!) 162/93  Pulse: (!) 106 (!) 104 99 (!) 111  Resp: 18 (!) 22 (!) 23 (!) 23  Temp:      TempSrc:      SpO2: 95% 95% 100% 91%     Constitutional: chronically ill appearing, alert and interactive, oriented to person Eyes: PERRL, lids and conjunctivae normal ENMT: Mucous membranes are moist.  Respiratory: bilateral faint wheezing and possible bibasilar crackles, slight tachypneic. No accessory muscle use.  Positive coughing spells Cardiovascular: Sinus tachycardia   Abdomen: no tenderness, not distended, Bowel sounds positive.  Musculoskeletal: Bilateral lower extremity pitting edema, right greater than left.  Skin: no rashes, lesions, ulcers. No induration Neurologic: Baseline mental debility Psychiatric: Calm and cooperative   Labs on Admission: I have personally reviewed following labs and imaging studies  CBC: Recent Labs  Lab 01/22/22 1546  WBC 5.5  NEUTROABS 3.2  HGB 12.2  HCT 38.2  MCV 92.9  PLT 122*    Basic Metabolic Panel: Recent Labs  Lab 01/22/22 1546  NA 141  K 3.9  CL 104  CO2 27  GLUCOSE 159*  BUN 21  CREATININE 1.43*  CALCIUM 8.9    GFR: CrCl cannot be calculated (Unknown ideal weight.).  Liver Function Tests: Recent Labs  Lab 01/22/22 1546  AST 18  ALT 15  ALKPHOS 103  BILITOT 0.7  PROT 7.6  ALBUMIN 3.8    Urine analysis:    Component Value Date/Time   COLORURINE STRAW (A) 12/28/2021 1821   APPEARANCEUR CLEAR 12/28/2021 1821   LABSPEC 1.003 (L) 12/28/2021 1821   PHURINE 7.0 12/28/2021 1821   GLUCOSEU >=500 (A) 12/28/2021 1821   HGBUR NEGATIVE 12/28/2021 1821   BILIRUBINUR NEGATIVE 12/28/2021 1821   KETONESUR NEGATIVE 12/28/2021 1821   PROTEINUR NEGATIVE 12/28/2021 1821   NITRITE NEGATIVE 12/28/2021 1821   LEUKOCYTESUR NEGATIVE 12/28/2021 1821    Radiological Exams on Admission: DG Chest 2 View  Result Date: 01/22/2022 CLINICAL DATA:  Shortness of breath EXAM: CHEST - 2 VIEW COMPARISON:  12/28/2021 FINDINGS: Bibasilar atelectasis. No focal consolidation. No pleural effusion or pneumothorax. Heart and mediastinal contours are unremarkable. Large hiatal  hernia. No acute osseous abnormality. IMPRESSION: 1. No active cardiopulmonary disease. Electronically Signed   By: Kathreen Devoid M.D.   On: 01/22/2022 15:33      Assessment/Plan Principal Problem:   SOB (shortness of breath) Active Problems:   Asthma exacerbation   Acute respiratory failure with hypoxia (HCC)   Intellectual disability   Hypertension   Acute on chronic diastolic CHF (congestive heart failure) (HCC)   Seizure disorder (HCC)   Hypothyroidism   Large hiatal hernia   CKD (chronic kidney disease) stage 3, GFR 30-59 ml/min (HCC)    Acute hypoxic respiratory failure/asthma exacerbation/ possible acute on chronic diastolic CHF -Also suspect acid reflux and large hiatal hernia cause wheezing -continue ppi, steroids, nebs, wean oxygen as able  Large hiatal hernia/dysphagia -recent CT chest showed "Large hiatal hernia with complete intrathoracic position of the stomach"  -Per sister patient is  told not a candidate for surgery for this  -per sister patient 'loves to eat", she is having one on one attention at group home, she also has a hospital bed with head of bed elevated -will have speech to see her -continue PPI, head of bed elevation, dysphagia diet 1 for now  Possible acute on Chronic diastolic chf Iv lasix , strict intake and output Due to bilateral lower extremity pitting edema Will get venous doppler  NIDDM2 Carb modified diet  Hold home oral meds  Start ssi while on steroids Check A1c  CKDIII Cr appears at baseline, monitor, renal dosing meds Hold lisinopril for now due to on higher dose of Lasix  HTN BP elevated on presentation Continue metoprolol, on higher dose of IV Lasix, hold lisinopril for now Add as needed hydralazine  HLD Continue statin  h/o seizure on Keppra , has been seizure-free ,  hypothyroidism , continue Synthroid  Intellectual debility /FTT, long term resident at group home, baseline bed to wheelchair bound, not able to stand up  by herself, able to feeds self   DVT prophylaxis: lovenox   Code Status:   DNR , confirmed with sister  Family Communication:  Sister over the phone  Patient is from: Group home   Anticipated DC to: Group home   Anticipated DC date: Pending respiratory status improvement    Consults called:  None Admission status:  Observation   Voice Recognition /Dragon dictation system was used to create this note, attempts have been made to  correct errors. Please contact the author with questions and/or clarifications.  Florencia Reasons MD PhD FACP [facility]  How to contact the [facility] Attending or Consulting provider [place] or covering provider during after hours [place], for this patient?   Check the care team in [facility] and look for a) attending/consulting [facility] provider listed and b) the [facility] team listed Log into [email or web address] and use [place]'s universal password to access. If you do not have the password, please contact the hospital operator. Locate the [facility] provider you are looking for under [facility] and page to a number that you can be directly reached. If you still have difficulty reaching the provider, please page the [facility] (Director on Call) for the Hospitalists listed on amion for assistance.  01/22/2022, 6:43 PM

## 2022-01-22 NOTE — ED Triage Notes (Signed)
Pt reports SHOB, wheezing, low O2, and cough. Pt recently d/c w/ pneumonia

## 2022-01-22 NOTE — ED Provider Notes (Signed)
  Physical Exam  BP (!) 161/106   Pulse 99   Temp 98 F (36.7 C) (Oral)   Resp (!) 23   SpO2 100%     Procedures  Procedures  ED Course / MDM    Medical Decision Making Amount and/or Complexity of Data Reviewed Labs: ordered. Radiology: ordered.  Risk Prescription drug management. Decision regarding hospitalization.   54F presenting to the ED with shortness of breath. Previously admitted for an asthma exacerbation. SOB, wheezing, hypoxic on room air, insp/exp wheezing bilaterally, getting treated for asthma exacerbation. Labs pending, CXR stable. Likely admit.  Labs interpreted myself significant for CMP with serum creatinine of 1.43, mild hyperglycemia 159, CBC without leukocytosis or anemia, BMP normal, initial troponin normal.  On my evaluation, the patient does have 1+ pitting edema bilaterally.  With her hypoxic respiratory failure, will administer 40 mg of IV Lasix.  Dr. Roda Shutters of hospitalist medicine was consulted for admission and ultimately accepted the patient in admission.       Ernie Avena, MD 01/22/22 [phone]

## 2022-01-23 ENCOUNTER — Observation Stay (HOSPITAL_COMMUNITY): Payer: Medicare Other

## 2022-01-23 DIAGNOSIS — Z809 Family history of malignant neoplasm, unspecified: Secondary | ICD-10-CM | POA: Diagnosis not present

## 2022-01-23 DIAGNOSIS — E1122 Type 2 diabetes mellitus with diabetic chronic kidney disease: Secondary | ICD-10-CM | POA: Diagnosis present

## 2022-01-23 DIAGNOSIS — R609 Edema, unspecified: Secondary | ICD-10-CM | POA: Diagnosis not present

## 2022-01-23 DIAGNOSIS — E785 Hyperlipidemia, unspecified: Secondary | ICD-10-CM | POA: Diagnosis present

## 2022-01-23 DIAGNOSIS — K449 Diaphragmatic hernia without obstruction or gangrene: Secondary | ICD-10-CM | POA: Diagnosis present

## 2022-01-23 DIAGNOSIS — R0602 Shortness of breath: Secondary | ICD-10-CM

## 2022-01-23 DIAGNOSIS — R413 Other amnesia: Secondary | ICD-10-CM | POA: Diagnosis not present

## 2022-01-23 DIAGNOSIS — E039 Hypothyroidism, unspecified: Secondary | ICD-10-CM | POA: Diagnosis present

## 2022-01-23 DIAGNOSIS — E1165 Type 2 diabetes mellitus with hyperglycemia: Secondary | ICD-10-CM | POA: Diagnosis present

## 2022-01-23 DIAGNOSIS — I5033 Acute on chronic diastolic (congestive) heart failure: Secondary | ICD-10-CM | POA: Diagnosis present

## 2022-01-23 DIAGNOSIS — I130 Hypertensive heart and chronic kidney disease with heart failure and stage 1 through stage 4 chronic kidney disease, or unspecified chronic kidney disease: Secondary | ICD-10-CM | POA: Diagnosis present

## 2022-01-23 DIAGNOSIS — R627 Adult failure to thrive: Secondary | ICD-10-CM | POA: Diagnosis present

## 2022-01-23 DIAGNOSIS — M810 Age-related osteoporosis without current pathological fracture: Secondary | ICD-10-CM | POA: Diagnosis present

## 2022-01-23 DIAGNOSIS — F79 Unspecified intellectual disabilities: Secondary | ICD-10-CM | POA: Diagnosis not present

## 2022-01-23 DIAGNOSIS — J45909 Unspecified asthma, uncomplicated: Secondary | ICD-10-CM | POA: Diagnosis present

## 2022-01-23 DIAGNOSIS — R1310 Dysphagia, unspecified: Secondary | ICD-10-CM | POA: Diagnosis present

## 2022-01-23 DIAGNOSIS — Z751 Person awaiting admission to adequate facility elsewhere: Secondary | ICD-10-CM | POA: Diagnosis not present

## 2022-01-23 DIAGNOSIS — J9601 Acute respiratory failure with hypoxia: Secondary | ICD-10-CM | POA: Diagnosis present

## 2022-01-23 DIAGNOSIS — N1832 Chronic kidney disease, stage 3b: Secondary | ICD-10-CM | POA: Diagnosis present

## 2022-01-23 DIAGNOSIS — Z66 Do not resuscitate: Secondary | ICD-10-CM | POA: Diagnosis present

## 2022-01-23 DIAGNOSIS — K219 Gastro-esophageal reflux disease without esophagitis: Secondary | ICD-10-CM | POA: Diagnosis present

## 2022-01-23 DIAGNOSIS — R531 Weakness: Secondary | ICD-10-CM | POA: Diagnosis not present

## 2022-01-23 DIAGNOSIS — R6250 Unspecified lack of expected normal physiological development in childhood: Secondary | ICD-10-CM | POA: Diagnosis present

## 2022-01-23 DIAGNOSIS — R5381 Other malaise: Secondary | ICD-10-CM | POA: Diagnosis present

## 2022-01-23 DIAGNOSIS — T380X5A Adverse effect of glucocorticoids and synthetic analogues, initial encounter: Secondary | ICD-10-CM | POA: Diagnosis present

## 2022-01-23 DIAGNOSIS — Z515 Encounter for palliative care: Secondary | ICD-10-CM | POA: Diagnosis not present

## 2022-01-23 DIAGNOSIS — Z6827 Body mass index (BMI) 27.0-27.9, adult: Secondary | ICD-10-CM | POA: Diagnosis not present

## 2022-01-23 DIAGNOSIS — Z7189 Other specified counseling: Secondary | ICD-10-CM | POA: Diagnosis not present

## 2022-01-23 DIAGNOSIS — G40909 Epilepsy, unspecified, not intractable, without status epilepticus: Secondary | ICD-10-CM | POA: Diagnosis present

## 2022-01-23 DIAGNOSIS — R339 Retention of urine, unspecified: Secondary | ICD-10-CM | POA: Diagnosis not present

## 2022-01-23 LAB — CBG MONITORING, ED
Glucose-Capillary: 158 mg/dL — ABNORMAL HIGH (ref 70–99)
Glucose-Capillary: 196 mg/dL — ABNORMAL HIGH (ref 70–99)

## 2022-01-23 LAB — BASIC METABOLIC PANEL
Anion gap: 13 (ref 5–15)
BUN: 23 mg/dL (ref 8–23)
CO2: 25 mmol/L (ref 22–32)
Calcium: 8.8 mg/dL — ABNORMAL LOW (ref 8.9–10.3)
Chloride: 101 mmol/L (ref 98–111)
Creatinine, Ser: 1.36 mg/dL — ABNORMAL HIGH (ref 0.44–1.00)
GFR, Estimated: 38 mL/min — ABNORMAL LOW (ref >60)
Glucose, Bld: 199 mg/dL — ABNORMAL HIGH (ref 70–99)
Potassium: 4 mmol/L (ref 3.5–5.1)
Sodium: 139 mmol/L (ref 135–145)

## 2022-01-23 LAB — GLUCOSE, CAPILLARY
Glucose-Capillary: 191 mg/dL — ABNORMAL HIGH (ref 70–99)
Glucose-Capillary: 191 mg/dL — ABNORMAL HIGH (ref 70–99)

## 2022-01-23 LAB — HEMOGLOBIN A1C
Hgb A1c MFr Bld: 6.9 % — ABNORMAL HIGH (ref 4.8–5.6)
Mean Plasma Glucose: 151.33 mg/dL

## 2022-01-23 MED ORDER — CHLORHEXIDINE GLUCONATE 0.12 % MT SOLN
15.0000 mL | Freq: Two times a day (BID) | OROMUCOSAL | Status: DC
  Administered 2022-01-24 – 2022-02-04 (×20): 15 mL via OROMUCOSAL
  Filled 2022-01-23 (×21): qty 15

## 2022-01-23 MED ORDER — METOPROLOL TARTRATE 25 MG/10 ML ORAL SUSPENSION
12.5000 mg | Freq: Two times a day (BID) | ORAL | Status: DC
  Administered 2022-01-23 – 2022-01-25 (×5): 12.5 mg via ORAL
  Filled 2022-01-23 (×6): qty 5

## 2022-01-23 MED ORDER — DIPHENHYDRAMINE HCL 25 MG PO CAPS
50.0000 mg | ORAL_CAPSULE | Freq: Three times a day (TID) | ORAL | Status: DC | PRN
Start: 2022-01-23 — End: 2022-01-23
  Filled 2022-01-23: qty 2

## 2022-01-23 MED ORDER — ALBUTEROL SULFATE (2.5 MG/3ML) 0.083% IN NEBU
2.5000 mg | INHALATION_SOLUTION | Freq: Three times a day (TID) | RESPIRATORY_TRACT | Status: DC
  Administered 2022-01-24 – 2022-01-29 (×16): 2.5 mg via RESPIRATORY_TRACT
  Filled 2022-01-23 (×16): qty 3

## 2022-01-23 MED ORDER — DIPHENHYDRAMINE HCL 50 MG/ML IJ SOLN
12.5000 mg | Freq: Once | INTRAMUSCULAR | Status: AC | PRN
  Administered 2022-01-23: 12.5 mg via INTRAVENOUS
  Filled 2022-01-23: qty 1

## 2022-01-23 MED ORDER — ORAL CARE MOUTH RINSE
15.0000 mL | Freq: Two times a day (BID) | OROMUCOSAL | Status: DC
  Administered 2022-01-24 – 2022-02-04 (×14): 15 mL via OROMUCOSAL

## 2022-01-23 MED ORDER — DIPHENHYDRAMINE HCL 12.5 MG/5ML PO ELIX: 50.0000 mg | ORAL_SOLUTION | Freq: Three times a day (TID) | ORAL | Status: DC | PRN

## 2022-01-23 NOTE — Progress Notes (Signed)
BLE venous duplex has been completed.   Results can be found under chart review under CV PROC. 01/23/2022 1:18 PM Reynald Woods RVT, RDMS

## 2022-01-23 NOTE — ED Notes (Signed)
Pts brief changed and purwick replaced.

## 2022-01-23 NOTE — Progress Notes (Signed)
PROGRESS NOTE    Leslie Chandler  [id number] DOB: 07/11/1935 DOA: 01/22/2022 PCP: Mindi Curling, PA-C     Brief Narrative:   intellectual disability from a group home  , baseline wheelchair-bound , with h/o seizure on Keppra , has been seizure-free , hypothyroidism ,HTN, HLD, NIDDM2, dCHF on lasix , CKDIII, h/o large hiatal hernia, not a candidate for surgical correction ( per sister), patient was diagnosed with asthma a few years ago, she has had multiple admission for pneumonia and asthma exacerbation over the years, most recently a few weeks ago in may is sent from group home due to wheezing, coughing and hypoxia   Subjective:  RN requested patient to be npo due to concerns for aspiration risk, sister would like her to try some liquid for now, sister reports she takes liquid meds /crushed meds ok at the facility Her vital signs are improving, cr stable Edema has improved   Sister at bedside   Assessment & Plan:  Principal Problem:   SOB (shortness of breath) Active Problems:   Asthma exacerbation   Acute respiratory failure with hypoxia (HCC)   Intellectual disability   Hypertension   Acute on chronic diastolic CHF (congestive heart failure) (HCC)   Seizure disorder (HCC)   Hypothyroidism   Large hiatal hernia   CKD (chronic kidney disease) stage 3, GFR 30-59 ml/min (HCC)   Acute hypoxemic respiratory failure (HCC)    Acute hypoxic respiratory failure/asthma exacerbation/ possible acute on chronic diastolic CHF -Also suspect acid reflux and large hiatal hernia cause wheezing -continue ppi, steroids, nebs, wean oxygen as able   Large hiatal hernia/dysphagia -recent CT chest showed "Large hiatal hernia with complete intrathoracic position of the stomach"  -Per sister patient is  told not a candidate for surgery for this  -per sister patient 'loves to eat", she is having one on one attention at group home, she also has a hospital bed with head of bed elevated -Seen by  speech, per speech therapist "pt with coughing AFTER intake - concern primary issue is her large hiatal hernia, recommend clear via TSP only and medicine crushed with puree and palliative care consult for goals of care" -continue PPI, head of bed elevation -palliative care consult order placed    Possible acute on Chronic diastolic chf, presented with short of breath and bilateral lower extremity pitting edema Appear responding to IV Lasix, continue as long as BP and creatinine allows strict intake and output  bilateral lower extremity pitting edema Improved with Lasix  venous doppler negative for DVT   NIDDM2 Carb modified diet  Hold home oral meds  Start ssi while on steroids  A1c 6.9   CKDIII Cr appears at baseline, monitor, renal dosing meds Hold lisinopril for now due to on higher dose of Lasix   HTN BP elevated on presentation Continue metoprolol, on higher dose of IV Lasix, hold lisinopril for now Add as needed hydralazine   HLD Continue statin   h/o seizure on Keppra , has been seizure-free ,  hypothyroidism , continue Synthroid   Intellectual debility /FTT, long term resident at group home, baseline bed to wheelchair bound, not able to stand up by herself, able to feeds self      I have Reviewed nursing notes, Vitals, pain scores, I/o's, Lab results and  imaging results since pt's last encounter, details please see discussion above  I ordered the following labs:  Unresulted Labs (From admission, onward)     Start     Ordered  01/29/22 0500  Creatinine, serum  (enoxaparin (LOVENOX)    CrCl < 30 ml/min)  Once,   R       Comments: while on enoxaparin therapy.    01/22/22 2210   01/24/22 [id number]  Basic metabolic panel  Tomorrow morning,   R        01/23/22 1846   01/24/22 0500  Magnesium  Tomorrow morning,   R        01/23/22 1846             DVT prophylaxis: enoxaparin (LOVENOX) injection 40 mg Start: 01/23/22 1000   Code Status:   Code Status:  DNR  Family Communication: sister at bedside  Disposition:    Dispo: The patient is from: group home              Anticipated d/c is to: group home with palliative care               Anticipated d/c date is: TBD  Antimicrobials:   Anti-infectives (From admission, onward)    None           Objective: Vitals:   01/23/22 1200 01/23/22 1300 01/23/22 1410 01/23/22 1800  BP: (!) 136/94 (!) 152/79 (!) 164/93 (!) 146/81  Pulse: 73 80 83 (!) 102  Resp: 18 17 18 17   Temp:   98.6 F (37 C) 98.2 F (36.8 C)  TempSrc:   Oral Oral  SpO2: 94% 95% 97% 94%  Weight:   72.7 kg   Height:   5\' 4"  (1.626 m)     Intake/Output Summary (Last 24 hours) at 01/23/2022 1846 Last data filed at 01/23/2022 0902 Gross per 24 hour  Intake --  Output 650 ml  Net -650 ml   Filed Weights   01/23/22 1410  Weight: 72.7 kg    Examination:  General exam: chronically ill appearing, oriented to person, not to time or place Respiratory system: appear to have upper airway sounds, Respiratory effort normal. Cardiovascular system:  RRR.  Gastrointestinal system: Abdomen is nondistended, soft and nontender.  Normal bowel sounds heard. Central nervous system: Alert and oriented. No focal neurological deficits. Extremities:  less bilateral lower extremity edema  Skin: No rashes, lesions or ulcers Psychiatry: Calm and cooperative, likely baseline mental debility    Data Reviewed: I have personally reviewed  labs and visualized  imaging studies since the last encounter and formulate the plan        Scheduled Meds:  budesonide (PULMICORT) nebulizer solution  0.25 mg Nebulization BID   chlorhexidine  15 mL Mouth Rinse BID   enoxaparin (LOVENOX) injection  40 mg Subcutaneous Q24H   feeding supplement (GLUCERNA SHAKE)  237 mL Oral BID BM   ferrous sulfate  220 mg Oral QPM   furosemide  40 mg Intravenous BID   guaiFENesin  5 mL Oral TID   insulin aspart  0-9 Units Subcutaneous TID WC   levETIRAcetam   250 mg Oral BID   levothyroxine  88 mcg Oral Q0600   [START ON 01/24/2022] mouth rinse  15 mL Mouth Rinse q12n4p   methylPREDNISolone (SOLU-MEDROL) injection  40 mg Intravenous Q12H   metoprolol tartrate  12.5 mg Oral BID   montelukast  10 mg Oral QPM   pantoprazole  40 mg Oral Daily   pravastatin  20 mg Oral QPM   Continuous Infusions:   LOS: 0 days      Florencia Reasons, MD PhD FACP [facility]  Available via Epic secure chat  7am-7pm for nonurgent issues Please page for urgent issues To page the attending provider between 7A-7P or the covering provider during after hours 7P-7A, please log into the web site [email or web address] and access using universal [place] password for that web site. If you do not have the password, please call the hospital operator.    01/23/2022, 6:46 PM

## 2022-01-23 NOTE — ED Notes (Signed)
Pt actively coughing with AM meds.  Pt sitting up at 90 degrees w/ medication administration. Pt's sister at bedside and states pt doesn't usually cough w/ meds. MD Erlinda Hong notified and aware.  Pt to resume NPO status until speech eval.

## 2022-01-23 NOTE — ED Notes (Addendum)
AM Meds held at this time, Pt NPO, pt high aspiration risk, speech eval pending.

## 2022-01-23 NOTE — Progress Notes (Signed)
01/23/22 1300  SLP Visit Information  SLP Received On 01/23/22  General Information  HPI Per MD notes " female with medical history of intellectual disability from a group home  , baseline wheelchair-bound , with h/o seizure on Keppra , has been seizure-free , hypothyroidism ,HTN, HLD, NIDDM2, dCHF on lasix , CKDIII, h/o large hiatal hernia, not a candidate for surgical correction ( per sister), patient was diagnosed with asthma a few years ago, she has had multiple admission for pneumonia and asthma exacerbation over the years, most recently a few weeks ago in may is sent from group home due to wheezing, coughing and hypoxia, coughing with po this am"  Swallow eval ordered. Pt had undergone MBS in 07/2015 with recommendation for reg/thin diet due to imrpoved swallow compared to prior mbs; clinical swallow eval 07/18/2019 recommended dys2/thin  Type of Study Bedside Swallow Evaluation  Previous Swallow Assessment see HPI  Diet Prior to this Study  (was clear - now NPO due coughing with po)  Temperature Spikes Noted No  Respiratory Status Room air  History of Recent Intubation No  Behavior/Cognition Alert;Distractible;Requires cueing;Pleasant mood (pt has intellectual disability)  Oral Cavity Assessment WFL  Oral Care Completed by SLP No  Oral Cavity - Dentition Other (Comment);Adequate natural dentition;Missing dentition (some missing)  Self-Feeding Abilities Other (Comment) (fed pt due to aspiration risk)  Patient Positioning Upright in bed  Baseline Vocal Quality Hoarse;Low vocal intensity  Volitional Cough Cognitively unable to elicit  Volitional Swallow  (DNT)  Oral Motor/Sensory Function  Overall Oral Motor/Sensory Function  (generalized weakness, oral discoordination, anterior lingual posture)  Ice Chips  Ice chips NT  Thin Liquid  Thin Liquid WFL  Presentation Spoon  Nectar Thick Liquid  Nectar Thick Liquid NT  Honey Thick Liquid  Honey Thick Liquid NT  Puree  Puree  WFL  Presentation Spoon  Solid  Solid NT  SLP - End of Session  Patient left in bed;with family/visitor present  Suspected Esophageal Findings  Suspected Esophageal Findings  (coughing AFTER minimal intake - also baseline cough)  SLP Assessment  Clinical Impression Statement (ACUTE ONLY) Pt know to SLP staff from remote prior admits.  Pt's swallowing function appears to be consistent with previous evaluations. She has reduced labial seal and lingual manipulation with anterior tongue thrust. Pt with baseline congested coughing - resulting in her erythema of face and clear discomfort.  Thus, SLP administered small amounts via Tsp only - purees and thin liquids are cleared with no oral residue.  No immediate coughing post-swallow nor multiple swallows across consistencies, however approx. 10 minutes post-snack, she demonstrates strong cough.  Suspect her primary asp risk is her large hiatal hernia and reflux.  Recommend clear liquid via tsp with strict precautions - assuring sitting upright and staying upright for at least 30 minutes after intake.  Sister present reports pt with increased wheeze and worse cough with this admit compared to May.  Reviewed importance of assuring pt is not dyspneic prior to giving po intake using demonstration of breath hold with swallow.  Dyspnea could cause pt to episodically aspirate from oropharyngeal swallow function.   Recommend family consider palliative meeting to help establish BOC given pt's repeated admissions, Intellectual disability and chronic dysphagia.  Pt's sister reports she recently lost her spouse and expressed pt has a care plan established through her outside agency.  She also expressed concerns for all caregivers "being on the same page" at pt's residence.  Recommend strict compliance with precautions to mitigate  aspiration risk.  Defer to MD for repeat MBS indication.  SLP Visit Diagnosis Other (comment)  Swallow Evaluation Recommendations  Liquid  Administration via Spoon  Medication Administration Crushed with puree  Supervision Patient able to self feed  Compensations Minimize environmental distractions;Slow rate;Small sips/bites  Postural Changes Seated upright at 90 degrees;Remain upright for at least 30 minutes after po intake  Treatment Plan  Oral Care Recommendations Oral care BID  Other Recommendations Have oral suction available  Treatment Recommendations Therapy as outlined in treatment plan below  Follow Up Recommendations No SLP follow up  Assistance recommended at discharge Frequent or constant Supervision/Assistance  Functional Status Assessment Patient has had a recent decline in their functional status and/or demonstrates limited ability to make significant improvements in function in a reasonable and predictable amount of time  Speech Therapy Frequency (ACUTE ONLY) min 1 x/week  Treatment Duration 1 week  Interventions Aspiration precaution training;Oral motor exercises;Compensatory techniques;Patient/family education  Prognosis  Prognosis for Safe Diet Advancement Fair  Barriers to Reach Goals Cognitive deficits  Individuals Consulted  Consulted and Agree with Results and Recommendations Family member/caregiver  Family Member Consulted Sister, June  Report Sent to  Referring physician;Other (comment) (RN)  SLP Time Calculation  SLP Start Time (ACUTE ONLY) 1221  SLP Stop Time (ACUTE ONLY) 1245  SLP Time Calculation (min) (ACUTE ONLY) 24 min  SLP Evaluations  $ SLP Speech Visit 1 Visit  SLP Evaluations  $BSS Swallow 1 Procedure  $Swallowing Treatment 1 Procedure   Kathleen Lime, MS [facility] SLP Acute Rehab Services Office [phone] Pager [phone]

## 2022-01-24 DIAGNOSIS — R531 Weakness: Secondary | ICD-10-CM

## 2022-01-24 DIAGNOSIS — Z515 Encounter for palliative care: Secondary | ICD-10-CM | POA: Diagnosis not present

## 2022-01-24 DIAGNOSIS — Z7189 Other specified counseling: Secondary | ICD-10-CM

## 2022-01-24 DIAGNOSIS — R0602 Shortness of breath: Secondary | ICD-10-CM | POA: Diagnosis not present

## 2022-01-24 LAB — BASIC METABOLIC PANEL
Anion gap: 10 (ref 5–15)
BUN: 35 mg/dL — ABNORMAL HIGH (ref 8–23)
CO2: 30 mmol/L (ref 22–32)
Calcium: 8.9 mg/dL (ref 8.9–10.3)
Chloride: 102 mmol/L (ref 98–111)
Creatinine, Ser: 1.68 mg/dL — ABNORMAL HIGH (ref 0.44–1.00)
GFR, Estimated: 30 mL/min — ABNORMAL LOW (ref >60)
Glucose, Bld: 163 mg/dL — ABNORMAL HIGH (ref 70–99)
Potassium: 4.5 mmol/L (ref 3.5–5.1)
Sodium: 142 mmol/L (ref 135–145)

## 2022-01-24 LAB — GLUCOSE, CAPILLARY
Glucose-Capillary: 155 mg/dL — ABNORMAL HIGH (ref 70–99)
Glucose-Capillary: 341 mg/dL — ABNORMAL HIGH (ref 70–99)
Glucose-Capillary: 162 mg/dL — ABNORMAL HIGH (ref 70–99)
Glucose-Capillary: 154 mg/dL — ABNORMAL HIGH (ref 70–99)

## 2022-01-24 LAB — MAGNESIUM: Magnesium: 2.4 mg/dL (ref 1.7–2.4)

## 2022-01-24 MED ORDER — PANTOPRAZOLE SODIUM 40 MG IV SOLR
40.0000 mg | Freq: Two times a day (BID) | INTRAVENOUS | Status: DC
  Administered 2022-01-24 – 2022-01-27 (×5): 40 mg via INTRAVENOUS
  Filled 2022-01-24 (×5): qty 10

## 2022-01-24 MED ORDER — ENOXAPARIN SODIUM 30 MG/0.3ML IJ SOSY
30.0000 mg | PREFILLED_SYRINGE | Freq: Every day | INTRAMUSCULAR | Status: DC
  Administered 2022-01-24 – 2022-01-28 (×4): 30 mg via SUBCUTANEOUS
  Filled 2022-01-24 (×4): qty 0.3

## 2022-01-24 MED ORDER — METHYLPREDNISOLONE SODIUM SUCC 40 MG IJ SOLR
40.0000 mg | Freq: Every day | INTRAMUSCULAR | Status: DC
  Administered 2022-01-25: 40 mg via INTRAVENOUS
  Filled 2022-01-24: qty 1

## 2022-01-24 MED ORDER — ENOXAPARIN SODIUM 30 MG/0.3ML IJ SOSY: 30.0000 mg | PREFILLED_SYRINGE | INTRAMUSCULAR | Status: DC

## 2022-01-24 NOTE — Progress Notes (Signed)
PROGRESS NOTE    Leslie Chandler  [id number] DOB: 29-Dec-1934 DOA: 01/22/2022 PCP: Mindi Curling, PA-C     Brief Narrative:   intellectual disability from a group home  , baseline wheelchair-bound , with h/o seizure on Keppra , has been seizure-free , hypothyroidism ,HTN, HLD, NIDDM2, dCHF on lasix , CKDIII, h/o large hiatal hernia, not a candidate for surgical correction ( per sister), patient was diagnosed with asthma a few years ago, she has had multiple admission for pneumonia and asthma exacerbation over the years, most recently a few weeks ago in may is sent from group home due to wheezing, coughing and hypoxia   Subjective:  Continue to have intermittent cough, vital signs has improved, bilateral lower extremity edema has almost resolved Sister is aware patient has continued risk of aspiration due to entire stomach in her chest  Sister desires diet to be advanced to full liquid for now She currently is agreeable for patient to go back to group home with palliative care if the group home can accommodate, she is not  ready for comfort care /hospice yet   Sister at bedside   Assessment & Plan:  Principal Problem:   SOB (shortness of breath) Active Problems:   Asthma exacerbation   Acute respiratory failure with hypoxia (HCC)   Intellectual disability   Hypertension   Acute on chronic diastolic CHF (congestive heart failure) (HCC)   Seizure disorder (HCC)   Hypothyroidism   Large hiatal hernia   CKD (chronic kidney disease) stage 3, GFR 30-59 ml/min (HCC)   Acute hypoxemic respiratory failure (HCC)    Acute hypoxic respiratory failure/asthma exacerbation?/ possible acute on chronic diastolic CHF -likely acid reflux and large hiatal hernia is the main  cause of wheezing -continue ppi,  nebs, wean oxygen as able, taper steroids -continue goals of care discussion   Large hiatal hernia/dysphagia -recent CT chest showed "Large hiatal hernia with complete intrathoracic  position of the stomach"  -Per sister patient is  told not a candidate for surgery for this  -per sister patient 'loves to eat", she is having one on one attention at group home, she also has a hospital bed with head of bed elevated -Seen by speech, per speech therapist "pt with coughing AFTER intake - concern primary issue is her large hiatal hernia, recommend clear via TSP only and medicine crushed with puree and palliative care consult for goals of care" -continue PPI, head of bed elevation -palliative care input appreciated  -will discuss with sister regarding simplify oral meds   Possible acute on Chronic diastolic chf, presented with short of breath and bilateral lower extremity pitting edema ( negative for DVT) On oral lasix 20mg  bid at home Received  IV Lasixx4 doses, edema has almost resolved, hold lasix for now as cr today is 1.68 strict intake and output Close monitor volume status Monitor renal function    NIDDM2 Carb modified diet  Hold home oral meds  Start ssi while on steroids  A1c 6.9   CKDIII Cr appears at baseline, monitor, renal dosing meds Hold lisinopril for now due to on higher dose of Lasix and fluctuating cr   HTN BP elevated on presentation Has improved, bp stable on current regimen   HLD Continue statin   h/o seizure on Keppra , has been seizure-free ,  hypothyroidism , continue Synthroid   Intellectual debility /FTT, long term resident at group home, baseline bed to wheelchair bound, not able to stand up by herself, able to feeds  self      I have Reviewed nursing notes, Vitals, pain scores, I/o's, Lab results and  imaging results since pt's last encounter, details please see discussion above  I ordered the following labs:  Unresulted Labs (From admission, onward)     Start     Ordered   01/29/22 0500  Creatinine, serum  (enoxaparin (LOVENOX)    CrCl < 30 ml/min)  Once,   R       Comments: while on enoxaparin therapy.    01/22/22 2210    01/25/22 [id number]  Basic metabolic panel  Daily,   R      01/24/22 0900             DVT prophylaxis: enoxaparin (LOVENOX) injection 30 mg Start: 01/24/22 1015   Code Status:   Code Status: DNR  Family Communication: sister at bedside  Disposition:    Dispo: The patient is from: group home              Anticipated d/c is to: group home with palliative care               Anticipated d/c date is: possible tomorrow if group home can accommodate patient's needs  Antimicrobials:   Anti-infectives (From admission, onward)    None           Objective: Vitals:   01/24/22 0918 01/24/22 1050 01/24/22 1333 01/24/22 1446  BP:  137/71 131/72   Pulse:  98 84   Resp:   18   Temp:   98.4 F (36.9 C)   TempSrc:   Oral   SpO2: 98%  94% 91%  Weight:      Height:       No intake or output data in the 24 hours ending 01/24/22 1545  Filed Weights   01/23/22 1410 01/24/22 0717  Weight: 72.7 kg 72.9 kg    Examination:  General exam: chronically ill appearing, oriented to person, not to time or place, sister states she is currently close to her baseline Respiratory system: appear to have upper airway sounds, Respiratory effort normal.  Continue to have intermittent coughing Cardiovascular system:  RRR.  Gastrointestinal system: Abdomen is nondistended, soft and nontender.  Normal bowel sounds heard. Central nervous system: Alert and oriented. No focal neurological deficits. Extremities:  less bilateral lower extremity edema  Skin: No rashes, lesions or ulcers Psychiatry: Calm and cooperative, likely baseline mental debility    Data Reviewed: I have personally reviewed  labs and visualized  imaging studies since the last encounter and formulate the plan        Scheduled Meds:  albuterol  2.5 mg Nebulization TID   budesonide (PULMICORT) nebulizer solution  0.25 mg Nebulization BID   chlorhexidine  15 mL Mouth Rinse BID   enoxaparin (LOVENOX) injection  30 mg  Subcutaneous Daily   feeding supplement (GLUCERNA SHAKE)  237 mL Oral BID BM   guaiFENesin  5 mL Oral TID   insulin aspart  0-9 Units Subcutaneous TID WC   levETIRAcetam  250 mg Oral BID   levothyroxine  88 mcg Oral Q0600   mouth rinse  15 mL Mouth Rinse q12n4p   [START ON 01/25/2022] methylPREDNISolone (SOLU-MEDROL) injection  40 mg Intravenous Daily   metoprolol tartrate  12.5 mg Oral BID   montelukast  10 mg Oral QPM   pantoprazole (PROTONIX) IV  40 mg Intravenous Q12H   pravastatin  20 mg Oral QPM   Continuous Infusions:   LOS: 1  day      Florencia Reasons, MD PhD FACP [facility]  Available via Epic secure chat 7am-7pm for nonurgent issues Please page for urgent issues To page the attending provider between 7A-7P or the covering provider during after hours 7P-7A, please log into the web site [email or web address] and access using universal [place] password for that web site. If you do not have the password, please call the hospital operator.    01/24/2022, 3:45 PM

## 2022-01-24 NOTE — Consult Note (Signed)
Consultation Note Date: 01/24/2022   Patient Name: Leslie Chandler  DOB: 1934-10-21  MRN: [id number]  Age / Sex: 87 y.o., female  PCP: Juanda Chance Referring Physician: Florencia Reasons, MD  Reason for Consultation: Establishing goals of care  HPI/Patient Profile: 87 y.o. female  admitted on 01/22/2022   Clinical Assessment and Goals of Care: 87 yo lady from group home is wheelchair-bound at baseline, history of seizure disorder on Keppra, hypothyroidism hypertension dyslipidemia diabetes diastolic heart failure stage III CKD.  Patient's main life limiting illness is her large hiatal hernia and she is not a candidate for surgical interventions as per her sister.  Patient also has a history of asthma, has had multiple admissions for pneumonia and asthma exacerbations.  She has been sent to from the group home due to wheezing coughing and hypoxia.  Now admitted to hospital medicine service with a working diagnosis of acute hypoxic respiratory failure, asthma exacerbation, possible acute on chronic diastolic heart failure.  Patient has large hiatal hernia and has ongoing dysphagia.  She has been seen and evaluated by speech therapy.  Current recommendations are for teaspoons of clear liquids and for medicines to be crushed with pure. Palliative medicine consult has been requested for ongoing goals of care discussions. Patient is awake alert resting in bed.  His sister is present at the bedside.  She is able to consume clear liquids 1 teaspoon at a time with her sister feeding her.  Patient responds to some questions asked.  Sitting upright in bed.  Does not appear to be in acute distress.  I introduced myself and palliative care as follows: Palliative medicine is specialized medical care for people living with serious illness. It focuses on providing relief from the symptoms and stress of a serious illness. The goal is to  improve quality of life for both the patient and the family. Goals of care: Broad aims of medical therapy in relation to the patient's values and preferences. Our aim is to provide medical care aimed at enabling patients to achieve the goals that matter most to them, given the circumstances of their particular medical situation and their constraints.  Goals wishes and values important to the patient attempted to be explored via her sister who was present at the bedside.  Brief life review also performed.  Discussed about the serious incurable nature of the patient's condition.  Discussed about optimizing patient's nutritional status and shifting focus towards a more comfort focused/palliative focused pathway.  To that end, recommend addition of palliative services as an extra layer of support at the patient's current group home if it can be arranged for.  Call placed but not able to get in touch with group home at this time.  Recommend consideration of palliative services at group home.  HCPOA  Sister Cleaster Corin [id number].   SUMMARY OF RECOMMENDATIONS   Agree with DNR Cautiously advance diet, sister HCPOA agent is aware of the patient's overall condition.  Consider addition of palliative support at the patient's group  home.  Thank you for the consult.   Code Status/Advance Care Planning: DNR   Symptom Management:    Palliative Prophylaxis:  Aspiration Psycho-social/Spiritual:  Desire for further Chaplaincy support:yes Additional Recommendations: Caregiving  Support/Resources  Prognosis:  Guarded   Discharge Planning:  recommend palliative services at her group home.        Primary Diagnoses: Present on Admission:  SOB (shortness of breath)  Asthma exacerbation  Hypertension  Hypothyroidism  Acute hypoxemic respiratory failure ([place])   I have reviewed the medical record, interviewed the patient and family, and examined the patient. The following aspects are  pertinent.  Past Medical History:  Diagnosis Date   Asthma    Cognitive developmental delay    Epilepsia ([place])    Hypertension    Mental retardation    Osteoporosis    Social History   Socioeconomic History   Marital status: Single    Spouse name: Not on file   Number of children: Not on file   Years of education: Not on file   Highest education level: Not on file  Occupational History   Not on file  Tobacco Use   Smoking status: Never   Smokeless tobacco: Never  Substance and Sexual Activity   Alcohol use: No   Drug use: No   Sexual activity: Not on file  Other Topics Concern   Not on file  Social History Narrative   Not on file   Social Determinants of Health   Financial Resource Strain: Not on file  Food Insecurity: Not on file  Transportation Needs: Not on file  Physical Activity: Not on file  Stress: Not on file  Social Connections: Not on file   Family History  Problem Relation Age of Onset   Cancer Mother    Scheduled Meds:  albuterol  2.5 mg Nebulization TID   budesonide (PULMICORT) nebulizer solution  0.25 mg Nebulization BID   chlorhexidine  15 mL Mouth Rinse BID   enoxaparin (LOVENOX) injection  30 mg Subcutaneous Daily   feeding supplement (GLUCERNA SHAKE)  237 mL Oral BID BM   ferrous sulfate  220 mg Oral QPM   guaiFENesin  5 mL Oral TID   insulin aspart  0-9 Units Subcutaneous TID WC   levETIRAcetam  250 mg Oral BID   levothyroxine  88 mcg Oral Q0600   mouth rinse  15 mL Mouth Rinse q12n4p   methylPREDNISolone (SOLU-MEDROL) injection  40 mg Intravenous Q12H   metoprolol tartrate  12.5 mg Oral BID   montelukast  10 mg Oral QPM   pantoprazole  40 mg Oral Daily   pravastatin  20 mg Oral QPM   Continuous Infusions: PRN Meds:.acetaminophen, albuterol, bismuth subsalicylate, diphenhydrAMINE, hydrALAZINE Medications Prior to Admission:  Prior to Admission medications   Medication Sig Start Date End Date Taking? Authorizing Provider   acetaminophen (TYLENOL) 500 MG tablet Take 500 mg by mouth every 6 (six) hours as needed for mild pain or headache.   Yes [provider]  albuterol (PROVENTIL) (2.5 MG/3ML) 0.083% nebulizer solution Take 2.5 mg by nebulization every 4 (four) hours as needed for wheezing or shortness of breath.   Yes [provider]  AQUEOUS VITAMIN D 10 MCG/ML LIQD Take 300 Units by mouth every evening. 12/24/21  Yes [provider]  aspirin 81 MG chewable tablet Chew 81 mg by mouth daily.    Yes [provider]  bacitracin 500 UNIT/GM ointment Apply 1 application. topically daily as needed for wound care.  Yes [provider]  Bismuth Subsalicylate (PEPTO-BISMOL MAX STRENGTH) 525 MG/15ML SUSP Take 5-15 mLs by mouth daily as needed (for indigestion).   Yes [provider]  calamine lotion Apply 1 application topically daily as needed for itching.   Yes [provider]  dimenhyDRINATE (DRAMAMINE) 50 MG tablet Take 50 mg by mouth every 8 (eight) hours as needed for dizziness or nausea (as directed).   Yes [provider]  diphenhydrAMINE (BENADRYL) 25 MG tablet Take 25 mg by mouth daily as needed for itching.   Yes [provider]  DM-Phenylephrine-Acetaminophen (SUDAFED PE PRESSURE+PAIN+COUGH PO) Take 5 mLs by mouth every 6 (six) hours as needed (for symptoms).   Yes [provider]  Ethyl Alcohol, Skin Cleanser, 70 % LIQD Apply 1 application. topically daily as needed (wound care).   Yes [provider]  Famotidine (PEPCID PO) Take 1 tablet by mouth daily as needed (indigestion).   Yes [provider]  FARXIGA 5 MG TABS tablet Take 5 mg by mouth daily. 12/24/21  Yes [provider]  feeding supplement, GLUCERNA SHAKE, (GLUCERNA SHAKE) LIQD Take 237 mLs by mouth 2 (two) times daily between meals.   Yes [provider]  ferrous sulfate 220 (44 FE) MG/5ML solution Take 220 mg by mouth every  evening.   Yes [provider]  fluticasone (FLOVENT HFA) 110 MCG/ACT inhaler Inhale 2 puffs into the lungs 2 (two) times daily.   Yes [provider]  furosemide (LASIX) 20 MG tablet Take 20 mg by mouth 2 (two) times daily. 12/24/21  Yes [provider]  guaiFENesin-dextromethorphan (ROBITUSSIN DM) 100-10 MG/5ML syrup Take 10 mLs by mouth every 6 (six) hours as needed for cough. 07/20/19  Yes Arrien, Jimmy Picket, MD  hydrocortisone cream 1 % Apply 1 application topically daily as needed for itching.   Yes [provider]  hydrogen peroxide 3 % external solution Apply 1 application. topically daily as needed (wound care).   Yes [provider]  ibuprofen (ADVIL) 200 MG tablet Take 200 mg by mouth every 6 (six) hours as needed for headache or mild pain.   Yes [provider]  Lactobacillus-Inulin (VIACTIV DIGESTIVE HEALTH) CHEW Chew 1 tablet by mouth daily.   Yes [provider]  levETIRAcetam (KEPPRA) 100 MG/ML solution Take 2.5 mLs by mouth 2 (two) times daily. 06/28/19  Yes [provider]  levothyroxine (SYNTHROID, LEVOTHROID) 88 MCG tablet Take 88 mcg by mouth daily before breakfast.   Yes [provider]  lisinopril (ZESTRIL) 10 MG tablet Take 10 mg by mouth daily.   Yes [provider]  loperamide (IMODIUM A-D) 2 MG tablet Take 2 mg by mouth 4 (four) times daily as needed for diarrhea or loose stools.   Yes [provider]  magnesium hydroxide (MILK OF MAGNESIA) 400 MG/5ML suspension Take 10 mLs by mouth daily as needed for mild constipation.   Yes [provider]  metFORMIN (GLUCOPHAGE) 500 MG tablet Take 500 mg by mouth daily with breakfast.   Yes [provider]  metoprolol tartrate (LOPRESSOR) 25 MG tablet Take 12.5 mg by mouth 2 (two) times daily. 12/24/21  Yes [provider]  montelukast (SINGULAIR) 10 MG tablet Take 10 mg by mouth every evening.    Yes [provider]  pantoprazole (PROTONIX) 40 MG tablet Take 40 mg by mouth daily.   Yes [provider]  petrolatum-hydrophilic-aloe vera (ALOE VESTA) ointment Apply 1 application. topically 2 (two) times daily as  needed for wound care.   Yes [provider]  potassium chloride (KLOR-CON) 10 MEQ tablet Take 10 mEq by mouth daily. 12/24/21  Yes [provider]  pravastatin (PRAVACHOL) 20 MG tablet Take 20 mg by mouth every evening.  06/28/19  Yes [provider]  metFORMIN (GLUCOPHAGE-XR) 500 MG 24 hr tablet Take 500 mg by mouth every morning. 12/24/21   [provider]   No Known Allergies Review of Systems + swallowing difficulty.   Physical Exam Chronically ill appearing Awake alert  Answers questions appropriately No distress Regular breath sounds Trace LE edema  Vital Signs: BP 131/72 (BP Location: Right Arm)   Pulse 84   Temp 98.4 F (36.9 C) (Oral)   Resp 18   Ht 5\' 4"  (1.626 m)   Wt 72.9 kg   SpO2 91%   BMI 27.59 kg/m  Pain Scale: Faces POSS *See Group Information*: 1-Acceptable,Awake and alert Pain Score: 0-No pain   SpO2: SpO2: 91 % O2 Device:SpO2: 91 % O2 Flow Rate: .O2 Flow Rate (L/min): 3 L/min  IO: Intake/output summary: No intake or output data in the 24 hours ending 01/24/22 1526  LBM:   Baseline Weight: Weight: 72.7 kg Most recent weight: Weight: 72.9 kg     Palliative Assessment/Data:   PPS 50%  Time In: 1430 Time Out:  1530 Time Total:  60 min  Greater than 50%  of this time was spent counseling and coordinating care related to the above assessment and plan.  Signed by: Loistine Chance, MD   Please contact Palliative Medicine Team phone at [phone] for questions and concerns.  For individual provider: See Shea Evans

## 2022-01-24 NOTE — TOC Initial Note (Signed)
Transition of Care [facility]) - Initial/Assessment Note    Patient Details  Name: Leslie Chandler MRN: [id number] Date of Birth: 1934/12/18  Transition of [facility]) CM/SW Contact:    Lennart Pall, LCSW Phone Number: 01/24/2022, 3:24 PM  Clinical Narrative:                 Met with pt's sister, Cleaster Corin, this afternoon to discuss dc planning needs.  She had previously met with both Dr. Rowe Pavy with PMT and with Dr. Erlinda Hong for medical update.  Sister is aware and agreeable that recommendation is being made for palliative care referral be made to follow pt in the community/ at group home.  At this time, messages have been left for Jeananne Rama, group home manager @ [facility],  requesting she call TOC to discuss recommendation with her as well and confirm they can accept pt back.    Expected Discharge Plan: Group Home Barriers to Discharge: Continued Medical Work up   Patient Goals and CMS Choice        Expected Discharge Plan and Services Expected Discharge Plan: Group Home In-house Referral: Clinical Social Work     Living arrangements for the past 2 months: Group Home                                      Prior Living Arrangements/Services Living arrangements for the past 2 months: Group Home   Patient language and need for interpreter reviewed:: Yes        Need for Family Participation in Patient Care: No (Comment) Care giver support system in place?: Yes (comment)      Activities of Daily Living Home Assistive Devices/Equipment: Hospital bed, Shower chair with back ADL Screening (condition at time of admission) Patient's cognitive ability adequate to safely complete daily activities?: No Is the patient deaf or have difficulty hearing?: Yes Does the patient have difficulty seeing, even when wearing glasses/contacts?: No Does the patient have difficulty concentrating, remembering, or making decisions?: Yes Patient able to express need for assistance with ADLs?: Yes  (vocalizes what she wants to vocalize) Does the patient have difficulty dressing or bathing?: Yes Independently performs ADLs?: No Communication: Independent Dressing (OT): Needs assistance Is this a change from baseline?: Pre-admission baseline Grooming: Needs assistance Is this a change from baseline?: Pre-admission baseline Feeding: Independent Bathing: Needs assistance Is this a change from baseline?: Pre-admission baseline Toileting: Needs assistance Is this a change from baseline?: Pre-admission baseline In/Out Bed: Needs assistance Is this a change from baseline?: Pre-admission baseline Walks in Home: Dependent (needs help pivoting to transfer, very wobbly, does not walk at all) Is this a change from baseline?: Pre-admission baseline Does the patient have difficulty walking or climbing stairs?: Yes Weakness of Legs: Both Weakness of Arms/Hands: Both  Permission Sought/Granted Permission sought to share information with : Family Supports, Customer service manager    Share Information with NAME: Cleaster Corin - sister - @ [phone]  Permission granted to share info w AGENCY: Jeananne Rama - group home manager [phone]        Emotional Assessment Appearance:: Appears stated age Attitude/Demeanor/Rapport: Unable to Assess Affect (typically observed): Unable to Assess Orientation: : Oriented to Self Alcohol / Substance Use: Not Applicable Psych Involvement: No (comment)  Admission diagnosis:  SOB (shortness of breath) [R06.02] Acute respiratory failure with hypoxia (HCC) [J96.01] Acute hypoxemic respiratory failure (HCC) [J96.01] Exacerbation of asthma, unspecified  asthma severity, unspecified whether persistent [J45.901] Patient Active Problem List   Diagnosis Date Noted   Acute hypoxemic respiratory failure ([place]) 01/23/2022   SOB (shortness of breath) 01/22/2022   Large hiatal hernia 01/22/2022   CKD (chronic kidney disease) stage 3, GFR 30-59 ml/min  (HCC) 01/22/2022   Acute respiratory failure with hypoxia ([place]) 12/28/2021   AKI (acute kidney injury) ([place]) 12/28/2021   Asthma exacerbation 04/10/2021   Pneumonia due to COVID-19 virus 07/16/2019   Acute respiratory disease due to COVID-19 virus 07/15/2019   Seizure disorder ([place]) 07/15/2019   Hypothyroidism 07/15/2019   Hyperlipidemia 07/15/2019   Acute on chronic diastolic CHF (congestive heart failure) ([place]) 08/08/2015   Aspiration pneumonia ([place]) 08/06/2015   Sepsis ([place]) 08/05/2015   Diarrhea 08/05/2015   Type 2 diabetes mellitus ([place]) 08/05/2015   Lactic acidosis 08/05/2015   CAP (community acquired pneumonia) 04/12/2014   Intellectual disability    Hypertension    Asthma    HCAP (healthcare-associated pneumonia) 04/11/2014   PCP:  Mindi Curling, PA-C Pharmacy:   [place], [place] - [address] [place] [address] Phone: [phone] Fax: [phone]     Social Determinants of Health (SDOH) Interventions    Readmission Risk Interventions    01/24/2022    3:20 PM  Readmission Risk Prevention Plan  Transportation Screening Complete  PCP or Specialist Appt within 5-7 Days Complete  Home Care Screening Complete

## 2022-01-25 DIAGNOSIS — Z7189 Other specified counseling: Secondary | ICD-10-CM | POA: Diagnosis not present

## 2022-01-25 DIAGNOSIS — R531 Weakness: Secondary | ICD-10-CM | POA: Diagnosis not present

## 2022-01-25 DIAGNOSIS — Z515 Encounter for palliative care: Secondary | ICD-10-CM | POA: Diagnosis not present

## 2022-01-25 DIAGNOSIS — R0602 Shortness of breath: Secondary | ICD-10-CM | POA: Diagnosis not present

## 2022-01-25 LAB — BASIC METABOLIC PANEL
Anion gap: 10 (ref 5–15)
BUN: 46 mg/dL — ABNORMAL HIGH (ref 8–23)
CO2: 31 mmol/L (ref 22–32)
Calcium: 8.9 mg/dL (ref 8.9–10.3)
Chloride: 104 mmol/L (ref 98–111)
Creatinine, Ser: 1.9 mg/dL — ABNORMAL HIGH (ref 0.44–1.00)
GFR, Estimated: 26 mL/min — ABNORMAL LOW (ref >60)
Glucose, Bld: 114 mg/dL — ABNORMAL HIGH (ref 70–99)
Potassium: 3.6 mmol/L (ref 3.5–5.1)
Sodium: 145 mmol/L (ref 135–145)

## 2022-01-25 LAB — GLUCOSE, CAPILLARY
Glucose-Capillary: 121 mg/dL — ABNORMAL HIGH (ref 70–99)
Glucose-Capillary: 181 mg/dL — ABNORMAL HIGH (ref 70–99)
Glucose-Capillary: 254 mg/dL — ABNORMAL HIGH (ref 70–99)
Glucose-Capillary: 220 mg/dL — ABNORMAL HIGH (ref 70–99)

## 2022-01-25 MED ORDER — LORAZEPAM 2 MG/ML IJ SOLN
0.2500 mg | Freq: Once | INTRAMUSCULAR | Status: AC | PRN
  Administered 2022-01-25: 0.25 mg via INTRAVENOUS
  Filled 2022-01-25: qty 1

## 2022-01-25 MED ORDER — BISACODYL 10 MG RE SUPP
10.0000 mg | Freq: Every day | RECTAL | Status: AC
  Administered 2022-01-25: 10 mg via RECTAL
  Filled 2022-01-25: qty 1

## 2022-01-25 MED ORDER — LEVOTHYROXINE SODIUM 88 MCG PO TABS
88.0000 ug | ORAL_TABLET | ORAL | Status: DC
Start: 2022-01-27 — End: 2022-02-04
  Administered 2022-01-27 – 2022-02-03 (×4): 88 ug via ORAL
  Filled 2022-01-25 (×4): qty 1

## 2022-01-25 NOTE — Progress Notes (Addendum)
PROGRESS NOTE    Leslie Chandler  [id number] DOB: 05-15-1935 DOA: 01/22/2022 PCP: Mindi Curling, PA-C     Brief Narrative:   intellectual disability from a group home  , baseline wheelchair-bound , with h/o seizure on Keppra , has been seizure-free , hypothyroidism ,HTN, HLD, NIDDM2, dCHF on lasix , CKDIII, h/o large hiatal hernia, not a candidate for surgical correction ( per sister), patient was diagnosed with asthma a few years ago, she has had multiple admission for pneumonia and asthma exacerbation over the years, most recently a few weeks ago in may is sent from group home due to wheezing, coughing and hypoxia   Subjective:  No significant interval changes Difficulty taking liquid meds this am.  Several episodes of coughing accompanied by desats into the 80s. Sister is aware patient has continued risk of aspiration due to entire stomach in her chest  Sister is deciding on palliative care vs hospice  Continue goals of care discussion  Sister at bedside   Assessment & Plan:  Principal Problem:   SOB (shortness of breath) Active Problems:   Asthma exacerbation   Acute respiratory failure with hypoxia (HCC)   Intellectual disability   Hypertension   Acute on chronic diastolic CHF (congestive heart failure) (HCC)   Seizure disorder (HCC)   Hypothyroidism   Large hiatal hernia   CKD (chronic kidney disease) stage 3, GFR 30-59 ml/min (HCC)   Acute hypoxemic respiratory failure (HCC)    Acute hypoxic respiratory failure -likely acid reflux and large hiatal hernia is the main  cause of wheezing//hypoxia -less likely asthma exacerbation, sister states she never has asthma until a few years ago -continue ppi,  nebs, wean oxygen as able, stop steroids -continue goals of care discussion   Large hiatal hernia/dysphagia -recent CT chest showed "Large hiatal hernia with complete intrathoracic position of the stomach"  -Per sister patient is  told not a candidate for surgery for  this  -per sister patient 'loves to eat", she is having one on one attention at group home, she also has a hospital bed with head of bed elevated -Seen by speech, per speech therapist "pt with coughing AFTER intake - concern primary issue is her large hiatal hernia, recommend clear via TSP only and medicine crushed with puree and palliative care consult for goals of care" -continue PPI, head of bed elevation -Per RN "Difficulty taking liquid meds this am.  Several episodes of coughing accompanied by desats into the 80s." -palliative care input appreciated , will benefit from hospice care     Possible acute on Chronic diastolic chf, presented with short of breath and bilateral lower extremity pitting edema ( negative for DVT) On oral lasix 20mg  bid at home Received  IV Lasixx4 doses, edema has almost resolved, hold lasix for now as cr is slightly trending up strict intake and output Close monitor volume status Monitor renal function  Urinary retention She is not able to communicate if she has symptom or not, not sure if acute or chronic In and out cath Patient is not able to cooperate with in and out cath  CKDIII Cr appears to slowly trending up Hold lisinopril , hold lasix    NIDDM2 Carb modified diet  Hold home oral meds  Start ssi while on steroids  A1c 6.9     HTN BP elevated on presentation Has improved, bp stable on current regimen   HLD Continue statin   h/o seizure on Keppra , has been seizure-free ,  hypothyroidism , decrease Synthroid to easy pill burden   Intellectual debility /FTT, long term resident at group home, baseline bed to wheelchair bound, not able to stand up by herself, able to feeds self      I have Reviewed nursing notes, Vitals, pain scores, I/o's, Lab results and  imaging results since pt's last encounter, details please see discussion above  I ordered the following labs:  Unresulted Labs (From admission, onward)     Start     Ordered    01/29/22 0500  Creatinine, serum  (enoxaparin (LOVENOX)    CrCl < 30 ml/min)  Once,   R       Comments: while on enoxaparin therapy.    01/22/22 2210   01/25/22 0842  Urinalysis, Routine w reflex microscopic  Once,   R        01/25/22 0841   01/25/22 [id number]  Basic metabolic panel  Daily,   R      01/24/22 0900             DVT prophylaxis: enoxaparin (LOVENOX) injection 30 mg Start: 01/24/22 1015   Code Status:   Code Status: DNR  Family Communication: sister at bedside  Disposition:    Dispo: The patient is from: group home              Anticipated d/c is to: group home with palliative care vs hospice               Anticipated d/c date is: likely on 6/12  Antimicrobials:   Anti-infectives (From admission, onward)    None           Objective: Vitals:   01/25/22 0545 01/25/22 0829 01/25/22 1044 01/25/22 1419  BP: (!) 139/58  (!) 153/76   Pulse: 63  95   Resp: 20     Temp:      TempSrc:      SpO2:  95%  97%  Weight: 72 kg     Height:        Intake/Output Summary (Last 24 hours) at 01/25/2022 1831 Last data filed at 01/25/2022 1300 Gross per 24 hour  Intake 1369 ml  Output 300 ml  Net 1069 ml    Filed Weights   01/23/22 1410 01/24/22 0717 01/25/22 0545  Weight: 72.7 kg 72.9 kg 72 kg    Examination:  General exam: chronically ill appearing, oriented to person, not to time or place, sister states she is currently close to her baseline Respiratory system: appear to have upper airway sounds, Respiratory effort normal.  Continue to have intermittent coughing Cardiovascular system:  RRR.  Gastrointestinal system: Abdomen is nondistended, soft and nontender.  Normal bowel sounds heard. Central nervous system: Alert and oriented. No focal neurological deficits. Extremities:  less bilateral lower extremity edema  Skin: No rashes, lesions or ulcers Psychiatry: Calm and cooperative, likely baseline mental debility    Data Reviewed: I have personally  reviewed  labs and visualized  imaging studies since the last encounter and formulate the plan        Scheduled Meds:  albuterol  2.5 mg Nebulization TID   bisacodyl  10 mg Rectal Daily   budesonide (PULMICORT) nebulizer solution  0.25 mg Nebulization BID   chlorhexidine  15 mL Mouth Rinse BID   enoxaparin (LOVENOX) injection  30 mg Subcutaneous Daily   feeding supplement (GLUCERNA SHAKE)  237 mL Oral BID BM   guaiFENesin  5 mL Oral TID   insulin aspart  0-9 Units Subcutaneous TID WC   levETIRAcetam  250 mg Oral BID   [START ON 01/27/2022] levothyroxine  88 mcg Oral Q M,W,F   mouth rinse  15 mL Mouth Rinse q12n4p   metoprolol tartrate  12.5 mg Oral BID   pantoprazole (PROTONIX) IV  40 mg Intravenous Q12H   Continuous Infusions:   LOS: 2 days      Florencia Reasons, MD PhD FACP [facility]  Available via Epic secure chat 7am-7pm for nonurgent issues Please page for urgent issues To page the attending provider between 7A-7P or the covering provider during after hours 7P-7A, please log into the web site [email or web address] and access using universal [place] password for that web site. If you do not have the password, please call the hospital operator.    01/25/2022, 6:31 PM

## 2022-01-25 NOTE — Plan of Care (Signed)
  Problem: Education: Goal: Ability to describe self-care measures that may prevent or decrease complications (Diabetes Survival Skills Education) will improve Outcome: Progressing Goal: Individualized Educational Video(s) Outcome: Progressing   Problem: Coping: Goal: Ability to adjust to condition or change in health will improve Outcome: Progressing   Problem: Fluid Volume: Goal: Ability to maintain a balanced intake and output will improve Outcome: Progressing   Problem: Health Behavior/Discharge Planning: Goal: Ability to identify and utilize available resources and services will improve Outcome: Progressing Goal: Ability to manage health-related needs will improve Outcome: Progressing   Problem: Metabolic: Goal: Ability to maintain appropriate glucose levels will improve Outcome: Progressing   Problem: Nutritional: Goal: Maintenance of adequate nutrition will improve Outcome: Progressing Goal: Progress toward achieving an optimal weight will improve Outcome: Progressing   Problem: Skin Integrity: Goal: Risk for impaired skin integrity will decrease Outcome: Progressing   Problem: Tissue Perfusion: Goal: Adequacy of tissue perfusion will improve Outcome: Progressing   Problem: Education: Goal: Knowledge of General Education information will improve Description: Including pain rating scale, medication(s)/side effects and non-pharmacologic comfort measures Outcome: Progressing   Problem: Health Behavior/Discharge Planning: Goal: Ability to manage health-related needs will improve Outcome: Progressing   Problem: Clinical Measurements: Goal: Ability to maintain clinical measurements within normal limits will improve Outcome: Progressing Goal: Will remain free from infection Outcome: Progressing Goal: Diagnostic test results will improve Outcome: Progressing Goal: Respiratory complications will improve Outcome: Progressing Goal: Cardiovascular complication will  be avoided Outcome: Progressing   Problem: Activity: Goal: Risk for activity intolerance will decrease Outcome: Progressing   Problem: Nutrition: Goal: Adequate nutrition will be maintained Outcome: Progressing   Problem: Coping: Goal: Level of anxiety will decrease Outcome: Progressing   Problem: Elimination: Goal: Will not experience complications related to bowel motility Outcome: Progressing Goal: Will not experience complications related to urinary retention Outcome: Progressing   Problem: Pain Managment: Goal: General experience of comfort will improve Outcome: Progressing   Problem: Safety: Goal: Ability to remain free from injury will improve Outcome: Progressing   Problem: Skin Integrity: Goal: Risk for impaired skin integrity will decrease Outcome: Progressing   No complaints this morning.  Pt resting comfortably.  Family at bedside.

## 2022-01-25 NOTE — Progress Notes (Signed)
Difficulty taking liquid meds this am.  Several episodes of coughing accompanied by desats into the 80s.

## 2022-01-25 NOTE — Progress Notes (Signed)
Daily Progress Note   Patient Name: Leslie Chandler       Date: 01/25/2022 DOB: 1935/01/11  Age: 87 y.o. MRN#: [id number] Attending Physician: Florencia Reasons, MD Primary Care Physician: Juanda Chance Admit Date: 01/22/2022  Reason for Consultation/Follow-up: Establishing goals of care  Subjective: Awake alert oriented, sitting up in a wheelchair, working on her lunch tray with her sister feeding her 1 teaspoon at a time.  No signs of aspiration at the time of my visit.  Appears quite alert.  Chart noted, discussed with sister about patient's current condition.  Length of Stay: 2  Current Medications: Scheduled Meds:   albuterol  2.5 mg Nebulization TID   bisacodyl  10 mg Rectal Daily   budesonide (PULMICORT) nebulizer solution  0.25 mg Nebulization BID   chlorhexidine  15 mL Mouth Rinse BID   enoxaparin (LOVENOX) injection  30 mg Subcutaneous Daily   feeding supplement (GLUCERNA SHAKE)  237 mL Oral BID BM   guaiFENesin  5 mL Oral TID   insulin aspart  0-9 Units Subcutaneous TID WC   levETIRAcetam  250 mg Oral BID   [START ON 01/27/2022] levothyroxine  88 mcg Oral Q M,W,F   mouth rinse  15 mL Mouth Rinse q12n4p   metoprolol tartrate  12.5 mg Oral BID   pantoprazole (PROTONIX) IV  40 mg Intravenous Q12H    Continuous Infusions:   PRN Meds: acetaminophen, albuterol, bismuth subsalicylate, diphenhydrAMINE, hydrALAZINE  Physical Exam         Awake alert Sitting up in a wheelchair Interactive with her sister Regular work of breathing S1-S2 No coughing/choking spells on my visit No distress  Vital Signs: BP (!) 153/76   Pulse 95   Temp 99.6 F (37.6 C) (Oral)   Resp 20   Ht 5\' 4"  (1.626 m)   Wt 72 kg   SpO2 97%   BMI 27.25 kg/m  SpO2: SpO2: 97 % O2 Device: O2 Device: Nasal  Cannula O2 Flow Rate: O2 Flow Rate (L/min): 3 L/min  Intake/output summary:  Intake/Output Summary (Last 24 hours) at 01/25/2022 1435 Last data filed at 01/25/2022 0657 Gross per 24 hour  Intake --  Output 300 ml  Net -300 ml   LBM: Last BM Date :  (PTA) Baseline Weight: Weight: 72.7 kg Most recent weight: Weight: 72  kg       Palliative Assessment/Data:      Patient Active Problem List   Diagnosis Date Noted   Acute hypoxemic respiratory failure ([place]) 01/23/2022   SOB (shortness of breath) 01/22/2022   Large hiatal hernia 01/22/2022   CKD (chronic kidney disease) stage 3, GFR 30-59 ml/min (HCC) 01/22/2022   Acute respiratory failure with hypoxia ([place]) 12/28/2021   AKI (acute kidney injury) ([place]) 12/28/2021   Asthma exacerbation 04/10/2021   Pneumonia due to COVID-19 virus 07/16/2019   Acute respiratory disease due to COVID-19 virus 07/15/2019   Seizure disorder ([place]) 07/15/2019   Hypothyroidism 07/15/2019   Hyperlipidemia 07/15/2019   Acute on chronic diastolic CHF (congestive heart failure) ([place]) 08/08/2015   Aspiration pneumonia ([place]) 08/06/2015   Sepsis ([place]) 08/05/2015   Diarrhea 08/05/2015   Type 2 diabetes mellitus ([place]) 08/05/2015   Lactic acidosis 08/05/2015   CAP (community acquired pneumonia) 04/12/2014   Intellectual disability    Hypertension    Asthma    HCAP (healthcare-associated pneumonia) 04/11/2014    Palliative Care Assessment & Plan   Patient Profile:    Assessment:  87 yo lady from group home is wheelchair-bound at baseline, history of seizure disorder on Keppra, hypothyroidism hypertension dyslipidemia diabetes diastolic heart failure stage III CKD.  Patient's main life limiting illness is her large hiatal hernia and she is not a candidate for surgical interventions as per her sister.  Patient also has a history of asthma, has had multiple admissions for pneumonia and asthma exacerbations.  She has been sent to from the group home due to  wheezing coughing and hypoxia.  Now admitted to hospital medicine service with a working diagnosis of acute hypoxic respiratory failure, asthma exacerbation, possible acute on chronic diastolic heart failure.  Patient has large hiatal hernia and has ongoing dysphagia.  She has been seen and evaluated by speech therapy.  Palliative medicine consult has been requested for ongoing goals of care discussions.  Recommendations/Plan: Agree with DNR Full liquids/comfort feeds with complete assistance, sitting up in a wheelchair, being as upright as possible for meals. Continue current mode of care Recommend discharge back to group home with hospice if this can be arranged for.  Discussed with sister at bedside.   Code Status:    Code Status Orders  (From admission, onward)           Start     Ordered   01/22/22 2211  Do not attempt resuscitation (DNR)  Continuous       Question Answer Comment  In the event of cardiac or respiratory ARREST Do not call a "code blue"   In the event of cardiac or respiratory ARREST Do not perform Intubation, CPR, defibrillation or ACLS   In the event of cardiac or respiratory ARREST Use medication by any route, position, wound care, and other measures to relive pain and suffering. May use oxygen, suction and manual treatment of airway obstruction as needed for comfort.      01/22/22 2210           Code Status History     Date Active Date Inactive Code Status Order ID Comments User Context   12/28/2021 1800 12/30/2021 1928 DNR [id number]  Dwyane Dee, MD ED   04/11/2021 1701 04/17/2021 0241 DNR [id number]  Donne Hazel, MD Inpatient   04/10/2021 2039 04/11/2021 1701 Full Code [id number]  Orene Desanctis, DO ED   07/15/2019 2049 07/20/2019 2009 DNR [id number]  Lenore Cordia, MD ED  08/06/2015 0902 08/08/2015 1821 DNR [id number]  Annita Brod, MD ED   08/06/2015 0115 08/06/2015 0902 Full Code [id number]  Reubin Milan, MD ED   04/12/2014 0159 04/14/2014  1838 Full Code [id number]  Berle Mull, MD Inpatient      Advance Directive Documentation    Flowsheet Row Most Recent Value  Type of Advance Directive Out of facility DNR (pink MOST or yellow form)  Pre-existing out of facility DNR order (yellow form or pink MOST form) Pink Most/Yellow Form available - Physician notified to receive inpatient order  "MOST" Form in Place? --       Prognosis:  Guarded   Discharge Planning: Recommend discharge back to group home with hospice services if this can be arranged for discussed with sister at bedside.  Care plan was discussed with sister at bedside and as well as Dr. Erlinda Hong.   Thank you for allowing the Palliative Medicine Team to assist in the care of this patient.                Greater than 50%  of this time was spent counseling and coordinating care related to the above assessment and plan.  Loistine Chance, MD  Please contact Palliative Medicine Team phone at [phone] for questions and concerns.

## 2022-01-26 DIAGNOSIS — R531 Weakness: Secondary | ICD-10-CM | POA: Diagnosis not present

## 2022-01-26 DIAGNOSIS — R0602 Shortness of breath: Secondary | ICD-10-CM | POA: Diagnosis not present

## 2022-01-26 DIAGNOSIS — Z7189 Other specified counseling: Secondary | ICD-10-CM | POA: Diagnosis not present

## 2022-01-26 DIAGNOSIS — Z515 Encounter for palliative care: Secondary | ICD-10-CM | POA: Diagnosis not present

## 2022-01-26 LAB — GLUCOSE, CAPILLARY
Glucose-Capillary: 137 mg/dL — ABNORMAL HIGH (ref 70–99)
Glucose-Capillary: 133 mg/dL — ABNORMAL HIGH (ref 70–99)
Glucose-Capillary: 288 mg/dL — ABNORMAL HIGH (ref 70–99)
Glucose-Capillary: 90 mg/dL (ref 70–99)

## 2022-01-26 MED ORDER — BISACODYL 10 MG RE SUPP
10.0000 mg | Freq: Every day | RECTAL | Status: AC
  Administered 2022-01-27: 10 mg via RECTAL
  Filled 2022-01-26 (×2): qty 1

## 2022-01-26 MED ORDER — METOPROLOL TARTRATE 5 MG/5ML IV SOLN
2.5000 mg | Freq: Three times a day (TID) | INTRAVENOUS | Status: DC
  Administered 2022-01-26 – 2022-01-27 (×3): 2.5 mg via INTRAVENOUS
  Filled 2022-01-26 (×4): qty 5

## 2022-01-26 MED ORDER — SODIUM CHLORIDE 0.9 % IV SOLN
250.0000 mg | Freq: Two times a day (BID) | INTRAVENOUS | Status: DC
  Administered 2022-01-26 – 2022-01-27 (×3): 250 mg via INTRAVENOUS
  Filled 2022-01-26 (×4): qty 2.5

## 2022-01-26 NOTE — Progress Notes (Addendum)
PROGRESS NOTE    Leslie Chandler  [id number] DOB: 07-12-1935 DOA: 01/22/2022 PCP: Mindi Curling, PA-C     Brief Narrative:   intellectual disability from a group home  , baseline wheelchair-bound , with h/o seizure on Keppra , has been seizure-free , hypothyroidism ,HTN, HLD, NIDDM2, dCHF on lasix , CKDIII, h/o large hiatal hernia, not a candidate for surgical correction ( per sister), patient was diagnosed with asthma a few years ago, she has had multiple admission for pneumonia and asthma exacerbation over the years, most recently a few weeks ago in may is sent from group home due to wheezing, coughing and hypoxia   Subjective:  Reportedly patient Was agitated last night ,she received iv ativan,x1 ,  sister states she has been sleeping this morning, she woke up around noon time Per RN patient refused meds and labs this am Patient currently is awake, no agitation,    Family at bedside  Sister agrees to stop checking labs, change keppra to iv for now until further goals of care discussion tomorrow  Sister wants to get her up in wheelchair to see if she can tolerate some clear liquid  Sister is aware patient has continued risk of aspiration due to entire stomach in her chest   Sister is leaning towards hospice care , she is expecting meeting on Monday with palliative care again to continue goals of care discussion    Assessment & Plan:  Principal Problem:   SOB (shortness of breath) Active Problems:   Asthma exacerbation   Acute respiratory failure with hypoxia (HCC)   Intellectual disability   Hypertension   Acute on chronic diastolic CHF (congestive heart failure) (HCC)   Seizure disorder (HCC)   Hypothyroidism   Large hiatal hernia   CKD (chronic kidney disease) stage 3, GFR 30-59 ml/min (HCC)   Acute hypoxemic respiratory failure (HCC)    Acute hypoxic respiratory failure -likely acid reflux and large hiatal hernia is the main  cause of wheezing//hypoxia -less  likely asthma exacerbation, sister states patient was diagnosed with " asthma" a few years ago, prior to that she never had issue with asthma -continue ppi,  nebs, wean oxygen as able, stop steroids as this could exacerbate stomach acid -continue goals of care discussion   Large hiatal hernia/dysphagia -recent CT chest showed "Large hiatal hernia with complete intrathoracic position of the stomach"  -Per sister patient is  told not a candidate for surgery for this  -per sister patient 'loves to eat", she is having one on one attention at group home, she also has a hospital bed with head of bed elevated -Seen by speech, per speech therapist "pt with coughing AFTER intake - concern primary issue is her large hiatal hernia, recommend clear via TSP only and medicine crushed with puree and palliative care consult for goals of care" -continue iv PPI, head of bed elevation -must be up at 90 degrees if sister desires to have oral trials  -stop checking labs, change meds to iv for now -palliative care input appreciated , will benefit from hospice care , to have family meeting on Monday again     Possible acute on Chronic diastolic chf, presented with  bilateral lower extremity pitting edema ( negative for DVT) On oral lasix 20mg  bid at home Received  IV Lasixx4 doses, edema has almost resolved, hold lasix for now as cr is slightly trending up, now refused labs strict intake and output No edema on exam today on 6/11, continue hold lasix,  continue goals of care discussion    CKDIII Cr appears to slowly trending up, now refusing labs Hold lisinopril , hold lasix  Acute urinary retention ( 400cc) Initially refusion in and out cath, per RN later she agreed to it Continue bladder scan    NIDDM2 Carb modified diet  Hold home oral meds  Start ssi while on steroids  A1c 6.9     HTN BP elevated on presentation Has improved, bp stable on current iv regimen   HLD Discontinue  statin to decrease  pill burden, sister agrees to   h/o seizure on Keppra , has been seizure-free ,   hypothyroidism , decrease Synthroid to easy pill burden   Intellectual debility /FTT, long term resident at group home, baseline bed to wheelchair bound, not able to stand up by herself, able to feeds self      I have Reviewed nursing notes, Vitals, pain scores, I/o's, Lab results and  imaging results since pt's last encounter, details please see discussion above      DVT prophylaxis: enoxaparin (LOVENOX) injection 30 mg Start: 01/24/22 1015   Code Status:   Code Status: DNR  Family Communication: sister at bedside daily Disposition:    Dispo: The patient is from: group home              Anticipated d/c is to: will benefit from hospice  service              Anticipated d/c date is:  palliative care to meet with family  again on Monday May be able to discharge on  Monday if  group home can arrange hospice  Antimicrobials:   Anti-infectives (From admission, onward)    None           Objective: Vitals:   01/25/22 2032 01/26/22 0456 01/26/22 0500 01/26/22 0802  BP: (!) 167/88 (!) 167/78    Pulse: 94 66    Resp: 18 18    Temp: 98.3 F (36.8 C) 97.6 F (36.4 C)    TempSrc: Oral Oral    SpO2: 93% 94%  94%  Weight:   72 kg   Height:        Intake/Output Summary (Last 24 hours) at 01/26/2022 1358 Last data filed at 01/26/2022 1155 Gross per 24 hour  Intake --  Output 600 ml  Net -600 ml    Filed Weights   01/24/22 0717 01/25/22 0545 01/26/22 0500  Weight: 72.9 kg 72 kg 72 kg    Examination:  General exam: chronically ill appearing, oriented to person, not to time or place,  Respiratory system: appear to have upper airway sounds, Respiratory effort normal.  Continue to have intermittent coughing Cardiovascular system:  RRR.  Gastrointestinal system: Abdomen is nondistended, soft and nontender.  Normal bowel sounds heard. Central nervous system: Alert and oriented. No focal  neurological deficits. Extremities:  less bilateral lower extremity edema  Skin: No rashes, lesions or ulcers Psychiatry: Calm and cooperative, likely baseline mental debility    Data Reviewed: I have personally reviewed  labs and visualized  imaging studies since the last encounter and formulate the plan        Scheduled Meds:  albuterol  2.5 mg Nebulization TID   bisacodyl  10 mg Rectal Daily   budesonide (PULMICORT) nebulizer solution  0.25 mg Nebulization BID   chlorhexidine  15 mL Mouth Rinse BID   enoxaparin (LOVENOX) injection  30 mg Subcutaneous Daily   feeding supplement (GLUCERNA SHAKE)  237 mL  Oral BID BM   guaiFENesin  5 mL Oral TID   insulin aspart  0-9 Units Subcutaneous TID WC   [START ON 01/27/2022] levothyroxine  88 mcg Oral Q M,W,F   mouth rinse  15 mL Mouth Rinse q12n4p   metoprolol tartrate  12.5 mg Oral BID   pantoprazole (PROTONIX) IV  40 mg Intravenous Q12H   Continuous Infusions:  levETIRAcetam       LOS: 3 days      Florencia Reasons, MD PhD FACP [facility]  Available via Epic secure chat 7am-7pm for nonurgent issues Please page for urgent issues To page the attending provider between 7A-7P or the covering provider during after hours 7P-7A, please log into the web site [email or web address] and access using universal [employer] password for that web site. If you do not have the password, please call the hospital operator.    01/26/2022, 1:58 PM

## 2022-01-27 DIAGNOSIS — Z7189 Other specified counseling: Secondary | ICD-10-CM | POA: Diagnosis not present

## 2022-01-27 DIAGNOSIS — Z515 Encounter for palliative care: Secondary | ICD-10-CM | POA: Diagnosis not present

## 2022-01-27 DIAGNOSIS — R531 Weakness: Secondary | ICD-10-CM | POA: Diagnosis not present

## 2022-01-27 DIAGNOSIS — R0602 Shortness of breath: Secondary | ICD-10-CM | POA: Diagnosis not present

## 2022-01-27 LAB — GLUCOSE, CAPILLARY
Glucose-Capillary: 112 mg/dL — ABNORMAL HIGH (ref 70–99)
Glucose-Capillary: 204 mg/dL — ABNORMAL HIGH (ref 70–99)
Glucose-Capillary: 213 mg/dL — ABNORMAL HIGH (ref 70–99)
Glucose-Capillary: 116 mg/dL — ABNORMAL HIGH (ref 70–99)

## 2022-01-27 MED ORDER — PANTOPRAZOLE SODIUM 40 MG PO TBEC
40.0000 mg | DELAYED_RELEASE_TABLET | Freq: Every day | ORAL | Status: DC
  Administered 2022-01-27 – 2022-01-28 (×2): 40 mg via ORAL
  Filled 2022-01-27 (×3): qty 1

## 2022-01-27 MED ORDER — LEVETIRACETAM 250 MG PO TABS
250.0000 mg | ORAL_TABLET | Freq: Two times a day (BID) | ORAL | Status: DC
  Administered 2022-01-27 – 2022-01-28 (×4): 250 mg via ORAL
  Filled 2022-01-27 (×5): qty 1

## 2022-01-27 NOTE — Progress Notes (Signed)
PROGRESS NOTE  Leslie Chandler  DOB: 05/30/1935  PCP: Mindi Curling, PA-C [id number]  DOA: 01/22/2022  LOS: 4 days  Hospital Day: 6  Brief narrative: Leslie Chandler is a 87 y.o. female with PMH significant for intellectual disability from a group home, baseline wheelchair-bound, history of seizure on Keppra, DM 2, HTN, HLD, diastolic CHF on Lasix, CKD 3, hypothyroidism, h/o large hiatal hernia, not a candidate for surgical correction ( per sister). Patient was diagnosed with asthma a few years ago and had had multiple admission for pneumonia and asthma exacerbation over the years, most recently a few weeks ago in May.   She was sent to the ED from group home due to wheezing, coughing and hypoxia.  Subjective: Patient was seen and examined this morning.  Pleasant elderly female.  Sitting up in wheelchair.  On supplemental oxygen.  Alert, awake, unable to have meaningful conversation with me. Her sister/POA was at bedside.   Principal Problem:   SOB (shortness of breath) Active Problems:   Asthma exacerbation   Acute respiratory failure with hypoxia (HCC)   Intellectual disability   Hypertension   Acute on chronic diastolic CHF (congestive heart failure) (HCC)   Seizure disorder (HCC)   Hypothyroidism   Large hiatal hernia   CKD (chronic kidney disease) stage 3, GFR 30-59 ml/min (HCC)   Acute hypoxemic respiratory failure (HCC)    Assessment and plan: Acute hypoxic respiratory failure -likely acid reflux and large hiatal hernia is the main  cause of wheezing//hypoxia -less likely asthma exacerbation, sister states patient was diagnosed with " asthma" a few years ago, prior to that she never had issue with asthma -Currently on ppi,  nebs, wean oxygen as able. steroids was stopped -Palliative care consultation obtained.  Patient is seeking hospice care postdischarge.   Large hiatal hernia/dysphagia -recent CT chest showed "Large hiatal hernia with complete intrathoracic position of  the stomach"  -Per sister patient is  told not a candidate for surgery for this  -per sister patient 'loves to eat", she is having one on one attention at group home, she also has a hospital bed with head of bed elevated -Seen by speech, per speech therapist "pt with coughing AFTER intake - concern primary issue is her large hiatal hernia, recommend clear via TSP only and medicine crushed with puree and palliative care consult for goals of care" -continue iv PPI, head of bed elevation -Had must be up at 90 degrees if sister desires to have oral trials    Acute on Chronic diastolic chf -presented with  bilateral lower extremity pitting edema ( negative for DVT) -On oral lasix 20mg  bid at home -Received  IV Lasixx4 doses, edema has almost resolved.  Currently Lasix on hold.    CKDIIIb -Cr appears to slowly trending up -Lasix and lisinopril on hold. Recent Labs    04/13/21 0348 04/15/21 0337 04/16/21 0324 12/28/21 1322 12/29/21 0535 12/30/21 0458 01/22/22 1546 01/23/22 0408 01/24/22 0549 01/25/22 0644  BUN 39* 43* 45* 36* 38* 49* 21 23 35* 46*  CREATININE 0.97 1.29* 1.11* 2.00* 1.80* 1.88* 1.43* 1.36* 1.68* 1.90*    Acute urinary retention ( 400cc) Initially refusion in and out cath, per RN later she agreed to it   NIDDM2 Carb modified diet  Hold home oral meds  Start ssi while on steroids  A1c 6.9     HTN BP elevated on presentation Has improved, bp stable on current regimen   HLD Discontinue  statin to decrease pill burden,  sister agrees to   h/o seizure on Keppra , has been seizure-free ,    hypothyroidism , decrease Synthroid to easy pill burden   Intellectual debility /FTT, long term resident at group home, baseline bed to wheelchair bound, not able to stand up by herself, able to feeds self   Goals of care   Code Status: DNR    Mobility: Wheelchair bound  Skin assessment:     Nutritional status:  Body mass index is 27.25 kg/m.          Diet:   Diet Order             Diet full liquid Room service appropriate? Yes; Fluid consistency: Thin  Diet effective now                   DVT prophylaxis:  enoxaparin (LOVENOX) injection 30 mg Start: 01/24/22 1015   Antimicrobials: None Fluid: None currently Consultants: Palliative care Family Communication: Sister at bedside  Status is: Inpatient  Continue in-hospital care because: Planned SNF with hospice placement Level of care: Med-Surg   Dispo: The patient is from: Group home              Anticipated d/c is to: Not back to group home as they are not able to provide hospice care. With hospice planned              Patient currently is not medically stable to d/c.   Difficult to place patient No     Infusions:   levETIRAcetam 250 mg (01/27/22 1001)    Scheduled Meds:  albuterol  2.5 mg Nebulization TID   bisacodyl  10 mg Rectal Daily   budesonide (PULMICORT) nebulizer solution  0.25 mg Nebulization BID   chlorhexidine  15 mL Mouth Rinse BID   enoxaparin (LOVENOX) injection  30 mg Subcutaneous Daily   feeding supplement (GLUCERNA SHAKE)  237 mL Oral BID BM   guaiFENesin  5 mL Oral TID   insulin aspart  0-9 Units Subcutaneous TID WC   levothyroxine  88 mcg Oral Q M,W,F   mouth rinse  15 mL Mouth Rinse q12n4p   metoprolol tartrate  2.5 mg Intravenous Q8H   pantoprazole (PROTONIX) IV  40 mg Intravenous Q12H    PRN meds: acetaminophen, albuterol, bismuth subsalicylate, diphenhydrAMINE, hydrALAZINE   Antimicrobials: Anti-infectives (From admission, onward)    None       Objective: Vitals:   01/27/22 0408 01/27/22 0829  BP: 137/69   Pulse: 63   Resp: 18   Temp: 98.8 F (37.1 C)   SpO2: 98% 90%    Intake/Output Summary (Last 24 hours) at 01/27/2022 1415 Last data filed at 01/27/2022 1255 Gross per 24 hour  Intake 480 ml  Output --  Net 480 ml   Filed Weights   01/24/22 0717 01/25/22 0545 01/26/22 0500  Weight: 72.9 kg 72 kg 72 kg   Weight  change:  Body mass index is 27.25 kg/m.   Physical Exam: General exam: Pleasant, elderly Caucasian female.  Not in physical distress at this time Skin: No rashes, lesions or ulcers. HEENT: Atraumatic, normocephalic, no obvious bleeding Lungs: Clear to auscultation bilaterally.  Shallow respiratory effort CVS: Regular rate and rhythm GI/Abd soft, nontender, nondistended, bowel sound present CNS: Alert, awake, limited conversation Psychiatry: Sad affect Extremities: No edema, no calf tenderness  Data Review: I have personally reviewed the laboratory data and studies available.  F/u labs ordered Unresulted Labs (From admission, onward)  Start     Ordered   01/29/22 0500  Creatinine, serum  (enoxaparin (LOVENOX)    CrCl < 30 ml/min)  Once,   R       Comments: while on enoxaparin therapy.    01/22/22 2210   01/25/22 0842  Urinalysis, Routine w reflex microscopic  Once,   R        01/25/22 0841            Signed, Terrilee Croak, MD [facility] 01/27/2022

## 2022-01-27 NOTE — Care Management Important Message (Signed)
Important Message  Patient Details IM Letter placed in Patients room. Name: Leslie Chandler MRN: [id number] Date of Birth: 1935/01/04   Medicare Important Message Given:  Yes     Caren Macadam 01/27/2022, 9:47 AM

## 2022-01-27 NOTE — Progress Notes (Signed)
Daily Progress Note   Patient Name: Leslie Chandler       Date: 01/27/2022 DOB: 05/29/1935  Age: 87 y.o. MRN#: [id number] Attending Physician: Terrilee Croak, MD Primary Care Physician: Juanda Chance Admit Date: 01/22/2022  Reason for Consultation/Follow-up: Establishing goals of care  Subjective: Awake alert oriented, sitting up in a wheelchair, working on her lunch tray with her sister feeding her 1 teaspoon at a time.  No signs of aspiration at the time of my visit.  Appears awake and alert.  Chart noted, discussed with sister about patient's current condition.  Length of Stay: 4  Current Medications: Scheduled Meds:   albuterol  2.5 mg Nebulization TID   bisacodyl  10 mg Rectal Daily   budesonide (PULMICORT) nebulizer solution  0.25 mg Nebulization BID   chlorhexidine  15 mL Mouth Rinse BID   enoxaparin (LOVENOX) injection  30 mg Subcutaneous Daily   feeding supplement (GLUCERNA SHAKE)  237 mL Oral BID BM   guaiFENesin  5 mL Oral TID   insulin aspart  0-9 Units Subcutaneous TID WC   levothyroxine  88 mcg Oral Q M,W,F   mouth rinse  15 mL Mouth Rinse q12n4p   metoprolol tartrate  2.5 mg Intravenous Q8H   pantoprazole (PROTONIX) IV  40 mg Intravenous Q12H    Continuous Infusions:  levETIRAcetam 250 mg (01/27/22 1001)    PRN Meds: acetaminophen, albuterol, bismuth subsalicylate, diphenhydrAMINE, hydrALAZINE  Physical Exam         Awake alert Sitting up in a wheelchair Interactive with her sister Regular work of breathing S1-S2 No coughing/choking spells on my visit No distress  Vital Signs: BP 137/69 (BP Location: Right Arm)   Pulse 63   Temp 98.8 F (37.1 C) (Oral)   Resp 18   Ht 5\' 4"  (1.626 m)   Wt 72 kg   SpO2 90%   BMI 27.25 kg/m  SpO2: SpO2: 90 % O2  Device: O2 Device: Nasal Cannula O2 Flow Rate: O2 Flow Rate (L/min): 2 L/min  Intake/output summary:  Intake/Output Summary (Last 24 hours) at 01/27/2022 1312 Last data filed at 01/27/2022 1255 Gross per 24 hour  Intake 480 ml  Output --  Net 480 ml    LBM: Last BM Date : 01/25/22 (per dtr at bedside) Baseline Weight: Weight: 72.7 kg Most  recent weight: Weight: 72 kg       Palliative Assessment/Data:      Patient Active Problem List   Diagnosis Date Noted   Acute hypoxemic respiratory failure ([place]) 01/23/2022   SOB (shortness of breath) 01/22/2022   Large hiatal hernia 01/22/2022   CKD (chronic kidney disease) stage 3, GFR 30-59 ml/min (HCC) 01/22/2022   Acute respiratory failure with hypoxia ([place]) 12/28/2021   AKI (acute kidney injury) ([place]) 12/28/2021   Asthma exacerbation 04/10/2021   Pneumonia due to COVID-19 virus 07/16/2019   Acute respiratory disease due to COVID-19 virus 07/15/2019   Seizure disorder ([place]) 07/15/2019   Hypothyroidism 07/15/2019   Hyperlipidemia 07/15/2019   Acute on chronic diastolic CHF (congestive heart failure) ([place]) 08/08/2015   Aspiration pneumonia ([place]) 08/06/2015   Sepsis ([place]) 08/05/2015   Diarrhea 08/05/2015   Type 2 diabetes mellitus ([place]) 08/05/2015   Lactic acidosis 08/05/2015   CAP (community acquired pneumonia) 04/12/2014   Intellectual disability    Hypertension    Asthma    HCAP (healthcare-associated pneumonia) 04/11/2014    Palliative Care Assessment & Plan   Patient Profile:    Assessment:  87 yo lady from group home is wheelchair-bound at baseline, history of seizure disorder on Keppra, hypothyroidism hypertension dyslipidemia diabetes diastolic heart failure stage III CKD.  Patient's main life limiting illness is her large hiatal hernia and she is not a candidate for surgical interventions as per her sister.  Patient also has a history of asthma, has had multiple admissions for pneumonia and asthma exacerbations.  She  has been sent to from the group home due to wheezing coughing and hypoxia.  Now admitted to hospital medicine service with a working diagnosis of acute hypoxic respiratory failure, asthma exacerbation, possible acute on chronic diastolic heart failure.  Patient has large hiatal hernia and has ongoing dysphagia.  She has been seen and evaluated by speech therapy.  Palliative medicine consult has been requested for ongoing goals of care discussions.  Recommendations/Plan: Agree with DNR Full liquids/comfort feeds with complete assistance, sitting up in a wheelchair, being as upright as possible for meals. Continue current mode of care Recommend discharge back to group home with hospice if this can be arranged for.  Discussed with sister at bedside. She is in agreement, will attempt to reach group home and also request TOC assistance.    Code Status:    Code Status Orders  (From admission, onward)           Start     Ordered   01/22/22 2211  Do not attempt resuscitation (DNR)  Continuous       Question Answer Comment  In the event of cardiac or respiratory ARREST Do not call a "code blue"   In the event of cardiac or respiratory ARREST Do not perform Intubation, CPR, defibrillation or ACLS   In the event of cardiac or respiratory ARREST Use medication by any route, position, wound care, and other measures to relive pain and suffering. May use oxygen, suction and manual treatment of airway obstruction as needed for comfort.      01/22/22 2210           Code Status History     Date Active Date Inactive Code Status Order ID Comments User Context   12/28/2021 1800 12/30/2021 1928 DNR [id number]  Dwyane Dee, MD ED   04/11/2021 1701 04/17/2021 0241 DNR [id number]  Donne Hazel, MD Inpatient   04/10/2021 2039 04/11/2021 1701 Full Code [id number]  Orene Desanctis, DO ED   07/15/2019 2049 07/20/2019 2009 DNR [id number]  Lenore Cordia, MD ED   08/06/2015 0902 08/08/2015 1821 DNR [id number]   Annita Brod, MD ED   08/06/2015 0115 08/06/2015 0902 Full Code [id number]  Reubin Milan, MD ED   04/12/2014 0159 04/14/2014 1838 Full Code [id number]  Berle Mull, MD Inpatient      Advance Directive Documentation    Flowsheet Row Most Recent Value  Type of Advance Directive Out of facility DNR (pink MOST or yellow form)  Pre-existing out of facility DNR order (yellow form or pink MOST form) Pink Most/Yellow Form available - Physician notified to receive inpatient order  "MOST" Form in Place? --       Prognosis:  Guarded   Discharge Planning: Recommend discharge back to group home with hospice services if this can be arranged for discussed with sister at bedside.  Care plan was discussed with sister at bedside and as well as [facility] colleague  Thank you for allowing the Palliative Medicine Team to assist in the care of this patient.                Greater than 50%  of this time was spent counseling and coordinating care related to the above assessment and plan.  Loistine Chance, MD  Please contact Palliative Medicine Team phone at [phone] for questions and concerns.

## 2022-01-27 NOTE — NC FL2 (Addendum)
[place] LEVEL OF CARE SCREENING TOOL     IDENTIFICATION  Patient Name: Leslie Chandler Birthdate: Mar 04, 1935 Sex: female Admission Date (Current Location): 01/22/2022  [facility] and [place] Number:  Herbalist and Address:  [facility],  [place] [address], [place]      Provider Number: [id number]  Attending Physician Name and Address:  Terrilee Croak, MD  Relative Name and Phone Number:  Cleaster Corin [phone]    Current Level of Care: Hospital Recommended Level of Care: Skilled Nursing Facility-Long term care Prior Approval Number:    Date Approved/Denied:   PASRR Number: Pending  Discharge Plan: SNF-Long term care    Current Diagnoses: Patient Active Problem List   Diagnosis Date Noted   Acute hypoxemic respiratory failure ([place]) 01/23/2022   SOB (shortness of breath) 01/22/2022   Large hiatal hernia 01/22/2022   CKD (chronic kidney disease) stage 3, GFR 30-59 ml/min ([place]) 01/22/2022   Acute respiratory failure with hypoxia ([place]) 12/28/2021   AKI (acute kidney injury) ([place]) 12/28/2021   Asthma exacerbation 04/10/2021   Pneumonia due to COVID-19 virus 07/16/2019   Acute respiratory disease due to COVID-19 virus 07/15/2019   Seizure disorder ([place]) 07/15/2019   Hypothyroidism 07/15/2019   Hyperlipidemia 07/15/2019   Acute on chronic diastolic CHF (congestive heart failure) ([place]) 08/08/2015   Aspiration pneumonia ([place]) 08/06/2015   Sepsis ([place]) 08/05/2015   Diarrhea 08/05/2015   Type 2 diabetes mellitus ([place]) 08/05/2015   Lactic acidosis 08/05/2015   CAP (community acquired pneumonia) 04/12/2014   Intellectual disability    Hypertension    Asthma    HCAP (healthcare-associated pneumonia) 04/11/2014    Orientation RESPIRATION BLADDER Height & Weight      (non verbal, follows commands)  O2 (2 L 02) Incontinent Weight: 72 kg Height:  5\' 4"  (162.6 cm)  BEHAVIORAL SYMPTOMS/MOOD NEUROLOGICAL BOWEL NUTRITION STATUS       Incontinent Diet (full liquids/comfort feeds with assistance)  AMBULATORY STATUS COMMUNICATION OF NEEDS Skin   Extensive Assist Does not communicate Bruising (right leg and perineal swelling)                       Personal Care Assistance Level of Assistance  Bathing, Feeding, Dressing Bathing Assistance: Limited assistance Feeding assistance: Independent Dressing Assistance: Limited assistance     Functional Limitations Info  Sight, Hearing, Speech Sight Info: Adequate Hearing Info: Adequate Speech Info: Impaired (non verbal)    SPECIAL CARE FACTORS FREQUENCY                       Contractures Contractures Info: Not present    Additional Factors Info  Code Status, Allergies Code Status Info: DNR Allergies Info: none known           Current Medications (01/27/2022):  This is the current hospital active medication list Current Facility-Administered Medications  Medication Dose Route Frequency Provider Last Rate Last Admin   acetaminophen (TYLENOL) tablet 500 mg  500 mg Oral Q6H PRN Florencia Reasons, MD       albuterol (PROVENTIL) (2.5 MG/3ML) 0.083% nebulizer solution 2.5 mg  2.5 mg Nebulization Q4H PRN Florencia Reasons, MD       albuterol (PROVENTIL) (2.5 MG/3ML) 0.083% nebulizer solution 2.5 mg  2.5 mg Nebulization TID Florencia Reasons, MD   2.5 mg at 01/27/22 1457   bisacodyl (DULCOLAX) suppository 10 mg  10 mg Rectal Daily Florencia Reasons, MD       bismuth  subsalicylate (PEPTO BISMOL) 262 MG/15ML suspension 5-15 mL  5-15 mL Oral Daily PRN Florencia Reasons, MD       budesonide (PULMICORT) nebulizer solution 0.25 mg  0.25 mg Nebulization BID Florencia Reasons, MD   0.25 mg at 01/27/22 0829   chlorhexidine (PERIDEX) 0.12 % solution 15 mL  15 mL Mouth Rinse BID Florencia Reasons, MD   15 mL at 01/27/22 0949   diphenhydrAMINE (BENADRYL) 12.5 MG/5ML elixir 50 mg  50 mg Oral Q8H PRN Florencia Reasons, MD       enoxaparin (LOVENOX) injection 30 mg  30 mg Subcutaneous Daily Leodis Sias T, RPH   30 mg at 01/27/22 0947   feeding  supplement (GLUCERNA SHAKE) (GLUCERNA SHAKE) liquid 237 mL  237 mL Oral BID BM Florencia Reasons, MD   237 mL at 01/27/22 0947   guaiFENesin (ROBITUSSIN) 100 MG/5ML liquid 5 mL  5 mL Oral TID Florencia Reasons, MD   5 mL at 01/27/22 0946   hydrALAZINE (APRESOLINE) injection 10 mg  10 mg Intravenous Q8H PRN Florencia Reasons, MD       insulin aspart (novoLOG) injection 0-9 Units  0-9 Units Subcutaneous TID WC Florencia Reasons, MD   3 Units at 01/27/22 1251   levETIRAcetam (KEPPRA) tablet 250 mg  250 mg Oral BID Dahal, Marlowe Aschoff, MD       levothyroxine (SYNTHROID) tablet 88 mcg  88 mcg Oral Q M,W,F Florencia Reasons, MD   88 mcg at 01/27/22 0946   MEDLINE mouth rinse  15 mL Mouth Rinse q12n4p Florencia Reasons, MD   15 mL at 01/27/22 1252   pantoprazole (PROTONIX) EC tablet 40 mg  40 mg Oral Daily Dahal, Marlowe Aschoff, MD         Discharge Medications: Please see discharge summary for a list of discharge medications.  Relevant Imaging Results:  Relevant Lab Results:   Additional Information [id number] Needs Hospice  Ona Rathert, Marjie Skiff, RN

## 2022-01-27 NOTE — TOC Progression Note (Signed)
Transition of Care [facility]) - Progression Note    Patient Details  Name: Leslie Chandler MRN: [id number] Date of Birth: 10-24-1934  [facility] CM/SW Contact  Strider Vallance, Meriam Sprague, RN Phone Number: 01/27/2022, 3:51 PM  Clinical Narrative:    Spoke with Group home liaison Mrs. Anderson.  She states that pt cannot come back to Group home with palliative or hospice services. Spoke with sister [name] at the bedside about potential dc plan. Pt will need pt be placed in long term care medicaid bed at a SNF because she needs hospice services. [facility] initiated. [facility] worker will need to come evaluate pt in person. FL2 faxed out to area SNFs. TOC will continue to follow.   Expected Discharge Plan: Long Term Nursing Home Barriers to Discharge: Continued Medical Work up  Expected Discharge Plan and Services Expected Discharge Plan: Long Term Nursing Home In-house Referral: Clinical Social Work Discharge Planning Services: CM Consult   Living arrangements for the past 2 months: Group Home                   Readmission Risk Interventions    01/27/2022    3:38 PM 01/24/2022    3:20 PM  Readmission Risk Prevention Plan  Transportation Screening Complete Complete  PCP or Specialist Appt within 5-7 Days Complete Complete  Home Care Screening Complete Complete  Medication Review (RN CM) Complete

## 2022-01-28 DIAGNOSIS — F79 Unspecified intellectual disabilities: Secondary | ICD-10-CM

## 2022-01-28 DIAGNOSIS — R0602 Shortness of breath: Secondary | ICD-10-CM | POA: Diagnosis not present

## 2022-01-28 DIAGNOSIS — Z7189 Other specified counseling: Secondary | ICD-10-CM | POA: Diagnosis not present

## 2022-01-28 DIAGNOSIS — Z515 Encounter for palliative care: Secondary | ICD-10-CM | POA: Diagnosis not present

## 2022-01-28 LAB — GLUCOSE, CAPILLARY
Glucose-Capillary: 126 mg/dL — ABNORMAL HIGH (ref 70–99)
Glucose-Capillary: 231 mg/dL — ABNORMAL HIGH (ref 70–99)
Glucose-Capillary: 137 mg/dL — ABNORMAL HIGH (ref 70–99)

## 2022-01-28 NOTE — TOC Progression Note (Signed)
Transition of Care [facility]) - Progression Note    Patient Details  Name: Iraida Cragin MRN: [id number] Date of Birth: 1934/10/18  Transition of Care [facility]) CM/SW Contact  Kloie Whiting, Meriam Sprague, RN Phone Number: 01/28/2022, 3:14 PM  Clinical Narrative:     Additional clinicals requested by [facility]. Clinicals sent as requested.  Expected Discharge Plan: Long Term Nursing Home Barriers to Discharge: Continued Medical Work up  Expected Discharge Plan and Services Expected Discharge Plan: Long Term Nursing Home In-house Referral: Clinical Social Work Discharge Planning Services: CM Consult   Living arrangements for the past 2 months: Group Home                  Readmission Risk Interventions    01/27/2022    3:38 PM 01/24/2022    3:20 PM  Readmission Risk Prevention Plan  Transportation Screening Complete Complete  PCP or Specialist Appt within 5-7 Days Complete Complete  Home Care Screening Complete Complete  Medication Review (RN CM) Complete

## 2022-01-28 NOTE — Progress Notes (Signed)
PROGRESS NOTE  Abella Euceda  DOB: 1935/07/22  PCP: Mindi Curling, PA-C [id number]  DOA: 01/22/2022  LOS: 5 days  Hospital Day: 7  Brief narrative: Jaclyne Pullum is a 87 y.o. female with PMH significant for intellectual disability from a group home, baseline wheelchair-bound, history of seizure on Keppra, DM 2, HTN, HLD, diastolic CHF on Lasix, CKD 3, hypothyroidism, h/o large hiatal hernia, not a candidate for surgical correction ( per sister). Patient was diagnosed with asthma a few years ago and had had multiple admission for pneumonia and asthma exacerbation over the years, most recently a few weeks ago in May.   She was sent to the ED from group home due to wheezing, coughing and hypoxia.  Subjective: Patient was seen and examined this morning.  Pleasant elderly female.  Sitting up in wheelchair.  On supplemental oxygen.  Alert, awake, unable to have meaningful conversation with me. Her sister/POA was at bedside. Pending SNF with hospice placement   Principal Problem:   SOB (shortness of breath) Active Problems:   Asthma exacerbation   Acute respiratory failure with hypoxia (HCC)   Intellectual disability   Hypertension   Acute on chronic diastolic CHF (congestive heart failure) (HCC)   Seizure disorder (HCC)   Hypothyroidism   Large hiatal hernia   CKD (chronic kidney disease) stage 3, GFR 30-59 ml/min (HCC)   Acute hypoxemic respiratory failure (HCC)    Assessment and plan: Acute hypoxic respiratory failure -likely acid reflux and large hiatal hernia is the main  cause of wheezing//hypoxia -less likely asthma exacerbation, sister states patient was diagnosed with " asthma" a few years ago, prior to that she never had issue with asthma -Currently on ppi,  nebs, wean oxygen as able. steroids was stopped -Palliative care consultation obtained.  Patient is seeking hospice care postdischarge.   Large hiatal hernia/dysphagia -recent CT chest showed "Large hiatal hernia with  complete intrathoracic position of the stomach"  -Per sister patient is  told not a candidate for surgery for this  -per sister patient 'loves to eat", she is having one on one attention at group home, she also has a hospital bed with head of bed elevated -Seen by speech, per speech therapist "pt with coughing AFTER intake - concern primary issue is her large hiatal hernia, recommend clear via TSP only and medicine crushed with puree and palliative care consult for goals of care" -continue iv PPI, head of bed elevation -Had must be up at 90 degrees if sister desires to have oral trials    Acute on Chronic diastolic CHF -presented with  bilateral lower extremity pitting edema ( negative for DVT) -On oral lasix 20mg  bid at home -Received  IV Lasixx X 4 doses, edema has almost resolved.  Currently Lasix on hold.  CKDIIIb -Cr appears to slowly trending up -Lasix and lisinopril on hold. Recent Labs    04/13/21 0348 04/15/21 0337 04/16/21 0324 12/28/21 1322 12/29/21 0535 12/30/21 0458 01/22/22 1546 01/23/22 0408 01/24/22 0549 01/25/22 0644  BUN 39* 43* 45* 36* 38* 49* 21 23 35* 46*  CREATININE 0.97 1.29* 1.11* 2.00* 1.80* 1.88* 1.43* 1.36* 1.68* 1.90*    NIDDM2 -Carb modified diet  -Can stop fingerstick checks   HTN BP elevated on presentation Has improved, bp stable on current regimen   h/o seizure on Keppra , has been seizure-free ,    Intellectual debility /FTT, long term resident at group home, baseline bed to wheelchair bound, not able to stand up by herself, able  to feeds self   Goals of care   Code Status: DNR    Mobility: Wheelchair bound  Skin assessment:     Nutritional status:  Body mass index is 27.25 kg/m.          Diet:  Diet Order             Diet full liquid Room service appropriate? Yes; Fluid consistency: Thin  Diet effective now                   DVT prophylaxis:  enoxaparin (LOVENOX) injection 30 mg Start: 01/24/22 1015    Antimicrobials: None Fluid: None currently Consultants: Palliative care Family Communication: Sister at bedside  Status is: Inpatient  Continue in-hospital care because: Planned SNF with hospice placement Level of care: Med-Surg   Dispo: The patient is from: Group home              Anticipated d/c is to: Not back to group home as they are not able to provide hospice care. SNF with hospice planned              Patient currently is not medically stable to d/c.   Difficult to place patient No     Infusions:     Scheduled Meds:  albuterol  2.5 mg Nebulization TID   bisacodyl  10 mg Rectal Daily   budesonide (PULMICORT) nebulizer solution  0.25 mg Nebulization BID   chlorhexidine  15 mL Mouth Rinse BID   enoxaparin (LOVENOX) injection  30 mg Subcutaneous Daily   feeding supplement (GLUCERNA SHAKE)  237 mL Oral BID BM   guaiFENesin  5 mL Oral TID   levETIRAcetam  250 mg Oral BID   levothyroxine  88 mcg Oral Q M,W,F   mouth rinse  15 mL Mouth Rinse q12n4p   pantoprazole  40 mg Oral Daily    PRN meds: acetaminophen, albuterol, bismuth subsalicylate, diphenhydrAMINE, hydrALAZINE   Antimicrobials: Anti-infectives (From admission, onward)    None       Objective: Vitals:   01/28/22 0538 01/28/22 0839  BP: (!) 147/71   Pulse: 66   Resp: 18   Temp: 97.7 F (36.5 C)   SpO2: 96% 96%    Intake/Output Summary (Last 24 hours) at 01/28/2022 1254 Last data filed at 01/28/2022 1000 Gross per 24 hour  Intake 720 ml  Output 350 ml  Net 370 ml   Filed Weights   01/24/22 0717 01/25/22 0545 01/26/22 0500  Weight: 72.9 kg 72 kg 72 kg   Weight change:  Body mass index is 27.25 kg/m.   Physical Exam: General exam: Pleasant, elderly Caucasian female.  Not in physical distress at this time Skin: No rashes, lesions or ulcers. HEENT: Atraumatic, normocephalic, no obvious bleeding Lungs: Clear to auscultation bilaterally.  Shallow respiratory effort CVS: Regular rate and  rhythm GI/Abd soft, nontender, nondistended, bowel sound present CNS: Alert, awake, limited conversation Psychiatry: Sad affect Extremities: No edema, no calf tenderness  Data Review: I have personally reviewed the laboratory data and studies available.  F/u labs ordered Unresulted Labs (From admission, onward)     Start     Ordered   01/29/22 0500  Creatinine, serum  (enoxaparin (LOVENOX)    CrCl < 30 ml/min)  Once,   R       Comments: while on enoxaparin therapy.    01/22/22 2210   01/25/22 0842  Urinalysis, Routine w reflex microscopic  Once,   R  01/25/22 0841            Signed, Terrilee Croak, MD [facility] 01/28/2022

## 2022-01-28 NOTE — Plan of Care (Signed)
  Problem: Activity: Goal: Risk for activity intolerance will decrease Outcome: Progressing   Problem: Pain Managment: Goal: General experience of comfort will improve Outcome: Progressing   Problem: Safety: Goal: Ability to remain free from injury will improve Outcome: Progressing   

## 2022-01-28 NOTE — Progress Notes (Signed)
Daily Progress Note   Patient Name: Leslie Chandler       Date: 01/28/2022 DOB: 03-Jun-1935  Age: 87 y.o. MRN#: [id number] Attending Physician: Terrilee Croak, MD Primary Care Physician: Juanda Chance Admit Date: 01/22/2022  Reason for Consultation/Follow-up: Establishing goals of care  Subjective: Awake alert no distress, resting in bed, sister at bedside.     Length of Stay: 5  Current Medications: Scheduled Meds:   albuterol  2.5 mg Nebulization TID   bisacodyl  10 mg Rectal Daily   budesonide (PULMICORT) nebulizer solution  0.25 mg Nebulization BID   chlorhexidine  15 mL Mouth Rinse BID   enoxaparin (LOVENOX) injection  30 mg Subcutaneous Daily   feeding supplement (GLUCERNA SHAKE)  237 mL Oral BID BM   guaiFENesin  5 mL Oral TID   insulin aspart  0-9 Units Subcutaneous TID WC   levETIRAcetam  250 mg Oral BID   levothyroxine  88 mcg Oral Q M,W,F   mouth rinse  15 mL Mouth Rinse q12n4p   pantoprazole  40 mg Oral Daily    Continuous Infusions:    PRN Meds: acetaminophen, albuterol, bismuth subsalicylate, diphenhydrAMINE, hydrALAZINE  Physical Exam         Awake alert  Interactive with her sister Regular work of breathing S1-S2 No coughing/choking spells on my visit No distress  Vital Signs: BP (!) 147/71 (BP Location: Right Arm)   Pulse 66   Temp 97.7 F (36.5 C) (Oral)   Resp 18   Ht 5\' 4"  (1.626 m)   Wt 72 kg   SpO2 96%   BMI 27.25 kg/m  SpO2: SpO2: 96 % O2 Device: O2 Device: Nasal Cannula O2 Flow Rate: O2 Flow Rate (L/min): 2 L/min  Intake/output summary:  Intake/Output Summary (Last 24 hours) at 01/28/2022 1028 Last data filed at 01/28/2022 0500 Gross per 24 hour  Intake 240 ml  Output 350 ml  Net -110 ml    LBM: Last BM Date : 01/25/22 (per dtr  at bedside) Baseline Weight: Weight: 72.7 kg Most recent weight: Weight: 72 kg       Palliative Assessment/Data:      Patient Active Problem List   Diagnosis Date Noted   Acute hypoxemic respiratory failure (HCC) 01/23/2022   SOB (shortness of breath) 01/22/2022   Large hiatal hernia  01/22/2022   CKD (chronic kidney disease) stage 3, GFR 30-59 ml/min (HCC) 01/22/2022   Acute respiratory failure with hypoxia ([place]) 12/28/2021   AKI (acute kidney injury) ([place]) 12/28/2021   Asthma exacerbation 04/10/2021   Pneumonia due to COVID-19 virus 07/16/2019   Acute respiratory disease due to COVID-19 virus 07/15/2019   Seizure disorder ([place]) 07/15/2019   Hypothyroidism 07/15/2019   Hyperlipidemia 07/15/2019   Acute on chronic diastolic CHF (congestive heart failure) ([place]) 08/08/2015   Aspiration pneumonia ([place]) 08/06/2015   Sepsis ([place]) 08/05/2015   Diarrhea 08/05/2015   Type 2 diabetes mellitus ([place]) 08/05/2015   Lactic acidosis 08/05/2015   CAP (community acquired pneumonia) 04/12/2014   Intellectual disability    Hypertension    Asthma    HCAP (healthcare-associated pneumonia) 04/11/2014    Palliative Care Assessment & Plan   Patient Profile:    Assessment:  87 yo lady from group home is wheelchair-bound at baseline, history of seizure disorder on Keppra, hypothyroidism hypertension dyslipidemia diabetes diastolic heart failure stage III CKD.  Patient's main life limiting illness is her large hiatal hernia and she is not a candidate for surgical interventions as per her sister.  Patient also has a history of asthma, has had multiple admissions for pneumonia and asthma exacerbations.  She has been sent to from the group home due to wheezing coughing and hypoxia.  Now admitted to hospital medicine service with a working diagnosis of acute hypoxic respiratory failure, asthma exacerbation, possible acute on chronic diastolic heart failure.  Patient has large hiatal hernia and has  ongoing dysphagia.  She has been seen and evaluated by speech therapy.  Palliative medicine consult has been requested for ongoing goals of care discussions.  Recommendations/Plan: Agree with DNR Full liquids/comfort feeds with as much aspiration precautions as possible.  Disposition: now being considered for SNF long term bed with hospice support.   Code Status:    Code Status Orders  (From admission, onward)           Start     Ordered   01/22/22 2211  Do not attempt resuscitation (DNR)  Continuous       Question Answer Comment  In the event of cardiac or respiratory ARREST Do not call a "code blue"   In the event of cardiac or respiratory ARREST Do not perform Intubation, CPR, defibrillation or ACLS   In the event of cardiac or respiratory ARREST Use medication by any route, position, wound care, and other measures to relive pain and suffering. May use oxygen, suction and manual treatment of airway obstruction as needed for comfort.      01/22/22 2210           Code Status History     Date Active Date Inactive Code Status Order ID Comments User Context   12/28/2021 1800 12/30/2021 1928 DNR [id number]  Dwyane Dee, MD ED   04/11/2021 1701 04/17/2021 0241 DNR [id number]  Donne Hazel, MD Inpatient   04/10/2021 2039 04/11/2021 1701 Full Code [id number]  Orene Desanctis, DO ED   07/15/2019 2049 07/20/2019 2009 DNR [id number]  Lenore Cordia, MD ED   08/06/2015 0902 08/08/2015 1821 DNR [id number]  Annita Brod, MD ED   08/06/2015 0115 08/06/2015 0902 Full Code [id number]  Reubin Milan, MD ED   04/12/2014 0159 04/14/2014 1838 Full Code [id number]  Berle Mull, MD Inpatient      Advance Directive Documentation    Flowsheet Row Most Recent Value  Type of Advance Directive  Out of facility DNR (pink MOST or yellow form)  Pre-existing out of facility DNR order (yellow form or pink MOST form) Pink Most/Yellow Form available - Physician notified to receive inpatient order   "MOST" Form in Place? --       Prognosis:  Guarded   Discharge Planning: SNF long term bed with hospice support.   Care plan was discussed with sister at bedside   Thank you for allowing the Palliative Medicine Team to assist in the care of this patient.                Greater than 50%  of this time was spent counseling and coordinating care related to the above assessment and plan.  Loistine Chance, MD  Please contact Palliative Medicine Team phone at [phone] for questions and concerns.

## 2022-01-29 DIAGNOSIS — R0602 Shortness of breath: Secondary | ICD-10-CM | POA: Diagnosis not present

## 2022-01-29 LAB — CREATININE, SERUM
Creatinine, Ser: 1.14 mg/dL — ABNORMAL HIGH (ref 0.44–1.00)
GFR, Estimated: 47 mL/min — ABNORMAL LOW (ref >60)

## 2022-01-29 MED ORDER — LOPERAMIDE HCL 2 MG PO CAPS
2.0000 mg | ORAL_CAPSULE | Freq: Four times a day (QID) | ORAL | Status: DC | PRN
Start: 2022-01-29 — End: 2022-02-04

## 2022-01-29 MED ORDER — LEVETIRACETAM 100 MG/ML PO SOLN
250.0000 mg | Freq: Two times a day (BID) | ORAL | Status: DC
  Administered 2022-01-29 – 2022-02-04 (×13): 250 mg via ORAL
  Filled 2022-01-29 (×13): qty 2.5

## 2022-01-29 MED ORDER — ENOXAPARIN SODIUM 40 MG/0.4ML IJ SOSY
40.0000 mg | PREFILLED_SYRINGE | Freq: Every day | INTRAMUSCULAR | Status: DC
  Administered 2022-01-29 – 2022-02-04 (×7): 40 mg via SUBCUTANEOUS
  Filled 2022-01-29 (×8): qty 0.4

## 2022-01-29 MED ORDER — HYDROCORTISONE 1 % EX CREA
1.0000 "application " | TOPICAL_CREAM | Freq: Every day | CUTANEOUS | Status: DC | PRN
  Filled 2022-01-29: qty 28

## 2022-01-29 MED ORDER — PANTOPRAZOLE 2 MG/ML SUSPENSION
40.0000 mg | Freq: Every day | ORAL | Status: DC
  Administered 2022-01-29 – 2022-02-04 (×7): 40 mg
  Filled 2022-01-29 (×7): qty 20

## 2022-01-29 MED ORDER — ALBUTEROL SULFATE (2.5 MG/3ML) 0.083% IN NEBU
2.5000 mg | INHALATION_SOLUTION | Freq: Two times a day (BID) | RESPIRATORY_TRACT | Status: DC
  Administered 2022-01-29 – 2022-02-04 (×11): 2.5 mg via RESPIRATORY_TRACT
  Filled 2022-01-29 (×12): qty 3

## 2022-01-29 NOTE — Progress Notes (Signed)
PROGRESS NOTE Leslie Chandler  [id number] DOB: May 30, 1935 DOA: 01/22/2022 PCP: Mindi Curling, PA-C   Brief Narrative/Hospital Course: 87 year old female with intellectual debility from group home, presents with wheezing, cough, acute hypoxia , most likely from  large hiatal hernia ( entire stomach up in chest)  Palliative care consulted and after extensive discussion plan for hospice, currently pending hospice placement.  But unable to go to group home with hospice.  Needs SNF    Subjective: Seen and examined, sister at the bedside trying to feed her patient had some coughing at times trying to drink liquid.  She states she is doing pleasure feeding.  Patient is under bedside chair strapped on wheelchair. Overall appears alert awake oriented x2 at baseline   Assessment and Plan: Principal Problem:   SOB (shortness of breath) Active Problems:   Asthma exacerbation   Acute respiratory failure with hypoxia (HCC)   Intellectual disability   Hypertension   Acute on chronic diastolic CHF (congestive heart failure) (HCC)   Seizure disorder (HCC)   Hypothyroidism   Large hiatal hernia   CKD (chronic kidney disease) stage 3, GFR 30-59 ml/min (HCC)   Acute hypoxemic respiratory failure (HCC)   Acute respiratory failure with hypoxia Large hiatal hernia: Hypoxia multifactorial with history of reflux, large hiatal hernia causing wheezing, less likely asthma exacerbation has had asthma diagnosed diffuse ago prior to that she never had issue with asthma continue PTA meds wean oxygen as tolerated off steroids,  Acute on chronic diastolic CHF: With bilateral pitting edema, no DVT on ultrasound, he is only on oral Lasix 20 twice daily at home received IV Lasix x4 doses,w/ resolution of edema.   CKD stage IIIb at baseline, Lasix and lisinopril on hold.  Intellectual disability Memory problem Failure to thrive Long-term resident of group home Baseline born to wheelchair not able to stand by  herself: cont supportive care fall precaution, she is from group home.  History of seizure disorder resume Keppra  Hypertension: Elevated on presentation currently stable, not on meds Hypothyroidism: Continue current Synthroid 88 mcg  DVT prophylaxis: enoxaparin (LOVENOX) injection 40 mg Start: 01/29/22 1000 Code Status:   Code Status: DNR Family Communication: plan of care discussed with sister at bedside. Patient status is: Inpatient because of pending placement to SNF with hospice Level of care: Med-Surg   Dispo: The patient is from: Group home            Anticipated disposition: SNF once bed available  Mobility Assessment (last 72 hours)     Mobility Assessment     Row Name 01/28/22 0745           Does patient have an order for bedrest or is patient medically unstable No - Continue assessment       What is the highest level of mobility based on the progressive mobility assessment? Level 1 (Bedfast) - Unable to balance while sitting on edge of bed                 Objective: Vitals last 24 hrs: Vitals:   01/28/22 2120 01/29/22 0419 01/29/22 0756 01/29/22 0758  BP:  (!) 153/73    Pulse:  75    Resp:  16    Temp:  97.6 F (36.4 C)    TempSrc:  Oral    SpO2: 95% 98% 95% 95%  Weight:      Height:       Weight change:   Physical Examination: General exam: alert awake able to  answer some questions, ill-appearing,older than stated age, weak appearing. HEENT:Oral mucosa moist, Ear/Nose WNL grossly, dentition normal. Respiratory system: bilaterally diminished BS, no use of accessory muscle Cardiovascular system: S1 & S2 +, No JVD. Gastrointestinal system: Abdomen soft,NT,ND, BS+ Nervous System:Alert, awake, moving extremities and grossly nonfocal Extremities: LE edema neg,distal peripheral pulses palpable.  Skin: No rashes,no icterus. MSK: Normal muscle bulk,tone, power  Medications reviewed:  Scheduled Meds:  albuterol  2.5 mg Nebulization BID   budesonide  (PULMICORT) nebulizer solution  0.25 mg Nebulization BID   chlorhexidine  15 mL Mouth Rinse BID   enoxaparin (LOVENOX) injection  40 mg Subcutaneous Daily   feeding supplement (GLUCERNA SHAKE)  237 mL Oral BID BM   guaiFENesin  5 mL Oral TID   levETIRAcetam  250 mg Oral BID   levothyroxine  88 mcg Oral Q M,W,F   mouth rinse  15 mL Mouth Rinse q12n4p   pantoprazole sodium  40 mg Per Tube Daily   Continuous Infusions:    Diet Order             Diet full liquid Room service appropriate? Yes; Fluid consistency: Thin  Diet effective now                            Intake/Output Summary (Last 24 hours) at 01/29/2022 1323 Last data filed at 01/29/2022 0840 Gross per 24 hour  Intake --  Output 651 ml  Net -651 ml   Net IO Since Admission: 690 mL [01/29/22 1323]  Wt Readings from Last 3 Encounters:  01/26/22 72 kg  12/28/21 76.8 kg  04/16/21 83 kg     Unresulted Labs (From admission, onward)     Start     Ordered   01/25/22 0842  Urinalysis, Routine w reflex microscopic  Once,   R        01/25/22 0841          Data Reviewed: I have personally reviewed following labs and imaging studies CBC: Recent Labs  Lab 01/22/22 1546  WBC 5.5  NEUTROABS 3.2  HGB 12.2  HCT 38.2  MCV 92.9  PLT [id number]*   Basic Metabolic Panel: Recent Labs  Lab 01/22/22 1546 01/23/22 0408 01/24/22 0549 01/25/22 0644 01/29/22 0656  NA 141 139 142 145  --   K 3.9 4.0 4.5 3.6  --   CL 104 101 102 104  --   CO2 27 25 30 31   --   GLUCOSE 159* 199* 163* 114*  --   BUN 21 23 35* 46*  --   CREATININE 1.43* 1.36* 1.68* 1.90* 1.14*  CALCIUM 8.9 8.8* 8.9 8.9  --   MG  --   --  2.4  --   --    GFR: Estimated Creatinine Clearance: 35.7 mL/min (A) (by C-G formula based on SCr of 1.14 mg/dL (H)). Liver Function Tests: Recent Labs  Lab 01/22/22 1546  AST 18  ALT 15  ALKPHOS 103  BILITOT 0.7  PROT 7.6  ALBUMIN 3.8   No results for input(s): "LIPASE", "AMYLASE" in the last 168  hours. No results for input(s): "AMMONIA" in the last 168 hours. Coagulation Profile: No results for input(s): "INR", "PROTIME" in the last 168 hours. BNP (last 3 results) No results for input(s): "PROBNP" in the last 8760 hours. HbA1C: No results for input(s): "HGBA1C" in the last 72 hours. CBG: Recent Labs  Lab 01/27/22 1810 01/27/22 2134 01/28/22 0727 01/28/22 1224 01/28/22  [place]* 116* 126* 231* 137*   Lipid Profile: No results for input(s): "CHOL", "HDL", "LDLCALC", "TRIG", "CHOLHDL", "LDLDIRECT" in the last 72 hours. Thyroid Function Tests: No results for input(s): "TSH", "T4TOTAL", "FREET4", "T3FREE", "THYROIDAB" in the last 72 hours. Sepsis Labs: No results for input(s): "PROCALCITON", "LATICACIDVEN" in the last 168 hours.  No results found for this or any previous visit (from the past 240 hour(s)).  Antimicrobials: Anti-infectives (From admission, onward)    None      Culture/Microbiology    Component Value Date/Time   SDES URINE, CATHETERIZED 03/22/2020 1057   SPECREQUEST NONE 03/22/2020 1057   CULT  03/22/2020 1057    NO GROWTH Performed at [facility] [address] [place], [place] [address]    REPTSTATUS 03/23/2020 FINAL 03/22/2020 1057    Other culture-see note  Radiology Studies: No results found.   LOS: 6 days   Antonieta Pert, MD [facility]  01/29/2022, 1:23 PM

## 2022-01-29 NOTE — Hospital Course (Addendum)
87 year old female with intellectual debility from group home, presents with wheezing, cough, acute hypoxia , most likely from  large hiatal hernia ( entire stomach up in chest)  Palliative care consulted and after extensive discussion plan for hospice, currently pending hospice placement.  But unable to go to group home with hospice.  Needs SNF.  TOC looking into long-term nursing home placement pending PSAAR no.

## 2022-01-29 NOTE — TOC Progression Note (Signed)
Transition of Care [facility]) - Progression Note    Patient Details  Name: Leslie Chandler MRN: [id number] Date of Birth: 22-Apr-1935  [facility] CM/SW Contact  Katlin Ciszewski, Meriam Sprague, RN Phone Number: 01/29/2022, 12:33 PM  Clinical Narrative:    Level 2 pasrr is still pending.   Expected Discharge Plan: Long Term Nursing Home Barriers to Discharge: Continued Medical Work up  Expected Discharge Plan and Services Expected Discharge Plan: Long Term Nursing Home In-house Referral: Clinical Social Work Discharge Planning Services: CM Consult   Living arrangements for the past 2 months: Group Home                     Readmission Risk Interventions    01/27/2022    3:38 PM 01/24/2022    3:20 PM  Readmission Risk Prevention Plan  Transportation Screening Complete Complete  PCP or Specialist Appt within 5-7 Days Complete Complete  Home Care Screening Complete Complete  Medication Review (RN CM) Complete

## 2022-01-30 DIAGNOSIS — Z7189 Other specified counseling: Secondary | ICD-10-CM | POA: Diagnosis not present

## 2022-01-30 DIAGNOSIS — F79 Unspecified intellectual disabilities: Secondary | ICD-10-CM | POA: Diagnosis not present

## 2022-01-30 DIAGNOSIS — R0602 Shortness of breath: Secondary | ICD-10-CM | POA: Diagnosis not present

## 2022-01-30 DIAGNOSIS — Z66 Do not resuscitate: Secondary | ICD-10-CM

## 2022-01-30 DIAGNOSIS — K449 Diaphragmatic hernia without obstruction or gangrene: Secondary | ICD-10-CM | POA: Diagnosis not present

## 2022-01-30 NOTE — Plan of Care (Signed)
  Problem: Fluid Volume: Goal: Ability to maintain a balanced intake and output will improve Outcome: Progressing   

## 2022-01-30 NOTE — Progress Notes (Signed)
PROGRESS NOTE Leslie Chandler  [id number] DOB: 1935-05-09 DOA: 01/22/2022 PCP: Jordan Hawks, PA-C   Brief Narrative/Hospital Course: 87 year old female with intellectual debility from group home, presents with wheezing, cough, acute hypoxia , most likely from  large hiatal hernia ( entire stomach up in chest)  Palliative care consulted and after extensive discussion plan for hospice, currently pending hospice placement.  But unable to go to group home with hospice.  Needs SNF.  TOC looking into long-term nursing home placement pending PSAAR no.  Subjective: Patient being cleaned this morning discussion sister no new complaints tolerating p.o. well  Assessment and Plan: Principal Problem:   SOB (shortness of breath) Active Problems:   Asthma exacerbation   Acute respiratory failure with hypoxia (HCC)   Intellectual disability   Hypertension   Acute on chronic diastolic CHF (congestive heart failure) (HCC)   Seizure disorder (HCC)   Hypothyroidism   Large hiatal hernia   CKD (chronic kidney disease) stage 3, GFR 30-59 ml/min (HCC)   Acute hypoxemic respiratory failure (HCC)   Acute respiratory failure with hypoxia Large hiatal hernia: Hypoxia multifactorial with history of reflux, large hiatal hernia causing wheezing, less likely asthma exacerbation has had asthma diagnosed years ago, prior to that she never had issue with asthma.  Doing well off oxygen.Continue PPI, continue soft diet tolerating well.  Feeding only in upright position.  Acute on chronic diastolic PTW:[facility] bilateral pitting edema, no DVT on ultrasound, only on oral Lasix 20 twice daily at home received IV Lasix x4 doses,w/ resolution of edema.  CKD stage IIIb:Renal function is at baseline. Lasix and lisinopril on hold.  Intellectual disability Memory problem Failure to thrive Long-term resident of group home Baseline born to wheelchair not able to stand by herself: cont supportive care fall precaution, she is  from group home.  History of seizure disorder cont home Keppra  Hypertension: Elevated on presentation currently stable, not on meds Hypothyroidism: Continue current Synthroid 88 mcg  DVT prophylaxis: enoxaparin (LOVENOX) injection 40 mg Start: 01/29/22 1000 Code Status:   Code Status: DNR Family Communication: plan of care discussed with sister at bedside. Patient status is: Inpatient because of pending placement to SNF with hospice Level of care: Med-Surg   Dispo: The patient is from: Group home            Anticipated disposition: LTC SNF once bed available, pending level 2 pasrr  Mobility Assessment (last 72 hours)     Mobility Assessment     Row Name 01/31/22 2032           Does patient have an order for bedrest or is patient medically unstable Yes- Bedfast (Level 1) - Complete       What is the highest level of mobility based on the progressive mobility assessment? Level 2 (Chairfast) - Balance while sitting on edge of bed and cannot stand       Is the above level different from baseline mobility prior to current illness? No - Consider discontinuing PT/OT                 Objective: Vitals last 24 hrs: Vitals:   02/01/22 1936 02/02/22 0555 02/02/22 0616 02/02/22 0617  BP:      Pulse:      Resp:      Temp:      TempSrc:      SpO2: 94%  98% 98%  Weight:  73.4 kg    Height:       Weight change:  3 kg  Physical Examination: General exam: Alert awake at baseline  Exam deferred patient being cleaned this am  discussed with sister  Medications reviewed:  Scheduled Meds:  albuterol  2.5 mg Nebulization BID   budesonide (PULMICORT) nebulizer solution  0.25 mg Nebulization BID   chlorhexidine  15 mL Mouth Rinse BID   enoxaparin (LOVENOX) injection  40 mg Subcutaneous Daily   feeding supplement (GLUCERNA SHAKE)  237 mL Oral BID BM   guaiFENesin  5 mL Oral TID   levETIRAcetam  250 mg Oral BID   levothyroxine  88 mcg Oral Q M,W,F   mouth rinse  15 mL Mouth Rinse  q12n4p   pantoprazole sodium  40 mg Per Tube Daily   Continuous Infusions:    Diet Order             DIET SOFT Room service appropriate? Yes; Fluid consistency: Thin  Diet effective now                            Intake/Output Summary (Last 24 hours) at 02/02/2022 1129 Last data filed at 02/02/2022 0500 Gross per 24 hour  Intake --  Output 300 ml  Net -300 ml   Net IO Since Admission: 452 mL [02/02/22 1129]  Wt Readings from Last 3 Encounters:  02/02/22 73.4 kg  12/28/21 76.8 kg  04/16/21 83 kg     Unresulted Labs (From admission, onward)    None     Data Reviewed: I have personally reviewed following labs and imaging studies CBC: No results for input(s): "WBC", "NEUTROABS", "HGB", "HCT", "MCV", "PLT" in the last 168 hours.  Basic Metabolic Panel: Recent Labs  Lab 01/29/22 0656  CREATININE 1.14*   GFR: Estimated Creatinine Clearance: 36.1 mL/min (A) (by C-G formula based on SCr of 1.14 mg/dL (H)). Liver Function Tests: No results for input(s): "AST", "ALT", "ALKPHOS", "BILITOT", "PROT", "ALBUMIN" in the last 168 hours.  No results for input(s): "LIPASE", "AMYLASE" in the last 168 hours. No results for input(s): "AMMONIA" in the last 168 hours. Coagulation Profile: No results for input(s): "INR", "PROTIME" in the last 168 hours. BNP (last 3 results) No results for input(s): "PROBNP" in the last 8760 hours. HbA1C: No results for input(s): "HGBA1C" in the last 72 hours. CBG: Recent Labs  Lab 01/27/22 1810 01/27/22 2134 01/28/22 0727 01/28/22 1224 01/28/22 1647  GLUCAP 213* 116* 126* 231* 137*   Lipid Profile: No results for input(s): "CHOL", "HDL", "LDLCALC", "TRIG", "CHOLHDL", "LDLDIRECT" in the last 72 hours. Thyroid Function Tests: No results for input(s): "TSH", "T4TOTAL", "FREET4", "T3FREE", "THYROIDAB" in the last 72 hours. Sepsis Labs: No results for input(s): "PROCALCITON", "LATICACIDVEN" in the last 168 hours.  No results found for  this or any previous visit (from the past 240 hour(s)).  Antimicrobials: Anti-infectives (From admission, onward)    None      Culture/Microbiology    Component Value Date/Time   SDES URINE, CATHETERIZED 03/22/2020 1057   SPECREQUEST NONE 03/22/2020 1057   CULT  03/22/2020 1057    NO GROWTH Performed at [facility], [address] [place], [place] [address]    REPTSTATUS 03/23/2020 FINAL 03/22/2020 1057    Other culture-see note  Radiology Studies: No results found.   LOS: 10 days   Lanae Boast, MD [facility]  02/02/2022, 11:29 AM

## 2022-01-30 NOTE — Care Management Important Message (Signed)
Important Message  Patient Details IM Letter placed in Patients room. Name: Leslie Chandler MRN: [id number] Date of Birth: 1934/12/24   Medicare Important Message Given:  Yes     Caren Macadam 01/30/2022, 10:06 AM

## 2022-01-30 NOTE — TOC Progression Note (Signed)
Transition of Care [facility]) - Progression Note    Patient Details  Name: [place] Zou MRN: [id number] Date of Birth: Nov 10, 1934  [facility]) CM/SW Contact  Izabellah Dadisman, Meriam Sprague, RN Phone Number: 01/30/2022, 1:23 PM  Clinical Narrative:     Level 2 pasrr is still pending  Expected Discharge Plan: Long Term Nursing Home Barriers to Discharge: Continued Medical Work up  Expected Discharge Plan and Services Expected Discharge Plan: Long Term Nursing Home In-house Referral: Clinical Social Work Discharge Planning Services: CM Consult   Living arrangements for the past 2 months: Group Home                     Readmission Risk Interventions    01/27/2022    3:38 PM 01/24/2022    3:20 PM  Readmission Risk Prevention Plan  Transportation Screening Complete Complete  PCP or Specialist Appt within 5-7 Days Complete Complete  Home Care Screening Complete Complete  Medication Review (RN CM) Complete

## 2022-01-30 NOTE — Progress Notes (Signed)
Daily Progress Note   Patient Name: Leslie Chandler       Date: 01/30/2022 DOB: 09-03-1934  Age: 87 y.o. MRN#: [id number] Attending Physician: Lanae Boast, MD Primary Care Physician: Barbarann Ehlers Admit Date: 01/22/2022  Reason for Consultation/Follow-up: Establishing goals of care  Subjective: Patient minimally verbal Sister at bedside requesting liberation in diet  Length of Stay: 7  Current Medications: Scheduled Meds:   albuterol  2.5 mg Nebulization BID   budesonide (PULMICORT) nebulizer solution  0.25 mg Nebulization BID   chlorhexidine  15 mL Mouth Rinse BID   enoxaparin (LOVENOX) injection  40 mg Subcutaneous Daily   feeding supplement (GLUCERNA SHAKE)  237 mL Oral BID BM   guaiFENesin  5 mL Oral TID   levETIRAcetam  250 mg Oral BID   levothyroxine  88 mcg Oral Q M,W,F   mouth rinse  15 mL Mouth Rinse q12n4p   pantoprazole sodium  40 mg Per Tube Daily    Continuous Infusions:   PRN Meds: acetaminophen, albuterol, bismuth subsalicylate, diphenhydrAMINE, hydrALAZINE, hydrocortisone cream, loperamide  Physical Exam Constitutional:      General: She is not in acute distress.    Appearance: She is ill-appearing.  Pulmonary:     Effort: Pulmonary effort is normal.  Skin:    General: Skin is warm and dry.             Vital Signs: BP (!) 149/81 (BP Location: Right Arm)   Pulse 75   Temp 98 F (36.7 C)   Resp 20   Ht 5\' 4"  (1.626 m)   Wt 73.7 kg   SpO2 95%   BMI 27.89 kg/m  SpO2: SpO2: 95 % O2 Device: O2 Device: Nasal Cannula O2 Flow Rate: O2 Flow Rate (L/min): 2 L/min  Intake/output summary:  Intake/Output Summary (Last 24 hours) at 01/30/2022 1645 Last data filed at 01/30/2022 0900 Gross per 24 hour  Intake 777 ml  Output 752 ml  Net 25 ml   LBM: Last BM Date :  01/29/22 Baseline Weight: Weight: 72.7 kg Most recent weight: Weight: 73.7 kg       Palliative Assessment/Data: PPS 40%      Patient Active Problem List   Diagnosis Date Noted   Acute hypoxemic respiratory failure (HCC) 01/23/2022   SOB (shortness of breath) 01/22/2022   Large hiatal hernia 01/22/2022   CKD (chronic kidney disease) stage 3, GFR 30-59 ml/min (HCC) 01/22/2022   Acute respiratory failure with hypoxia (HCC) 12/28/2021   AKI (acute kidney injury) (HCC) 12/28/2021   Asthma exacerbation 04/10/2021   Pneumonia due to COVID-19 virus 07/16/2019   Acute respiratory disease due to COVID-19 virus 07/15/2019   Seizure disorder (HCC) 07/15/2019   Hypothyroidism 07/15/2019   Hyperlipidemia 07/15/2019   Acute on chronic diastolic CHF (congestive heart failure) (HCC) 08/08/2015   Aspiration pneumonia (HCC) 08/06/2015   Sepsis (HCC) 08/05/2015   Diarrhea 08/05/2015   Type 2 diabetes mellitus (HCC) 08/05/2015   Lactic acidosis 08/05/2015   CAP (community acquired pneumonia) 04/12/2014   Intellectual disability    Hypertension    Asthma    HCAP (healthcare-associated pneumonia) 04/11/2014    Palliative Care Assessment & Plan   HPI:  87 yo lady from group home is wheelchair-bound at baseline, history of seizure disorder on Keppra, hypothyroidism hypertension dyslipidemia diabetes diastolic heart failure stage III CKD.  Patient's main life limiting illness is her large hiatal hernia and she is not a candidate for surgical interventions as per her sister.  Patient also has a history of asthma, has had multiple admissions for pneumonia and asthma exacerbations.  She has been sent to from the group home due to wheezing coughing and hypoxia.  Now admitted to hospital medicine service with a working diagnosis of acute hypoxic respiratory failure, asthma exacerbation, possible acute on chronic diastolic heart failure.  Patient has large hiatal hernia and has ongoing dysphagia.  She has  been seen and evaluated by speech therapy.  Palliative medicine consult has been requested for ongoing goals of care discussions.  Assessment: Called to bedside as patient's family was requesting liberation and diet. Discussed with family member that goals remain to focus on comfort and quality of life and pursue long-term care placement with hospice support.  We reviewed patient's hiatal hernia and how this puts her at risk for aspiration.  We reviewed previous diet recommendations for full liquid.  Sister reports patient is wanting foods other than this and asks if this can be liberated.  We discussed potential increased risk of aspiration and multiple measures to mitigate aspiration risk.  Sister accepts risk and asks that we liberate diet to soft diet as she feels this will improve patient's quality of life.  All questions and concerns answered.  Sister has her contact information for further questions.   Recommendations/Plan: Change diet to soft, family accepts risk of aspiration - will try small, frequent meals Family reiterates desire to focus on patient's comfort and quality of life and feels that liberating her diet a bit will achieve that goal  Code Status: DNR  Discharge Planning: LTC with hospice  Care plan was discussed with SLP, patient's sister  Thank you for allowing the Palliative Medicine Team to assist in the care of this patient.  *Please note that this is a verbal dictation therefore any spelling or grammatical errors are due to the "[employer]" system interpretation.  Gerlean Ren, DNP, [facility] Palliative Medicine Team Team Phone # [phone]  Pager [phone]

## 2022-01-30 NOTE — Progress Notes (Signed)
PROGRESS NOTE Leslie Chandler  ZOX:[id number] DOB: [birth year] DOA: 01/22/2022 PCP: Jordan HawksGordon, Sarah B, PA-C   Brief Narrative/Hospital Course: 87 year old female with intellectual debility from group home, presents with wheezing, cough, acute hypoxia , most likely from  large hiatal hernia ( entire stomach up in chest)  Palliative care consulted and after extensive discussion plan for hospice, currently pending hospice placement.  But unable to go to group home with hospice.  Needs SNF  Subjective: Seen and examined, alert awake oriented x2 sister at the bedside, sister requesting to see if patient can eat soft diet today.     Assessment and Plan: Principal Problem:   SOB (shortness of breath) Active Problems:   Asthma exacerbation   Acute respiratory failure with hypoxia (HCC)   Intellectual disability   Hypertension   Acute on chronic diastolic CHF (congestive heart failure) (HCC)   Seizure disorder (HCC)   Hypothyroidism   Large hiatal hernia   CKD (chronic kidney disease) stage 3, GFR 30-59 ml/min (HCC)   Acute hypoxemic respiratory failure (HCC)   Acute respiratory failure with hypoxia Large hiatal hernia: Hypoxia multifactorial with history of reflux, large hiatal hernia causing wheezing, less likely asthma exacerbation has had asthma diagnosed diffuse ago prior to that she never had issue with asthma. She is off oxygen, off steroid tolerating well.  Continue PPI.  Speech eval to advance diet  Acute on chronic diastolic CHF: With bilateral pitting edema, no DVT on ultrasound, he is only on oral Lasix 20 twice daily at home received IV Lasix x4 doses,w/ resolution of edema.  Doing well.  CKD stage IIIb Renal function is at baseline. Lasix and lisinopril on hold.  Intellectual disability Memory problem Failure to thrive Long-term resident of group home Baseline born to wheelchair not able to stand by herself: cont supportive care fall precaution, she is from group home.  History  of seizure disorder cont home Keppra  Hypertension: Elevated on presentation currently stable, not on meds Hypothyroidism: Continue current Synthroid 88 mcg  DVT prophylaxis: enoxaparin (LOVENOX) injection 40 mg Start: 01/29/22 1000 Code Status:   Code Status: DNR Family Communication: plan of care discussed with sister at bedside. Patient status is: Inpatient because of pending placement to SNF with hospice Level of care: Med-Surg   Dispo: The patient is from: Group home            Anticipated disposition: SNF once bed available, pending PSAAR no and placement  Mobility Assessment (last 72 hours)     Mobility Assessment     Row Name 01/28/22 0745           Does patient have an order for bedrest or is patient medically unstable No - Continue assessment       What is the highest level of mobility based on the progressive mobility assessment? Level 1 (Bedfast) - Unable to balance while sitting on edge of bed                 Objective: Vitals last 24 hrs: Vitals:   01/29/22 1337 01/29/22 2043 01/29/22 2115 01/30/22 0445  BP: (!) 150/79 (!) 157/95  (!) 149/71  Pulse: 72 71  64  Resp: 17 14  14   Temp: 97.9 F (36.6 C) 97.6 F (36.4 C)  (!) 97.4 F (36.3 C)  TempSrc: Oral Oral  Oral  SpO2: 94% 97% 97% 98%  Weight:    73.7 kg  Height:       Weight change:   Physical Examination: General  exam: AA oriented x2, elderly, older than stated age, weak appearing. HEENT:Oral mucosa moist, Ear/Nose WNL grossly, dentition normal. Respiratory system: bilaterally diminished, no use of accessory muscle Cardiovascular system: S1 & S2 +, No JVD,. Gastrointestinal system: Abdomen soft,NT,ND,BS+ Nervous System:Alert, awake, moving extremities and grossly nonfocal Extremities: LE ankle edema neg, distal peripheral pulses palpable.  Skin: No rashes,no icterus. MSK: Normal muscle bulk,tone, power   Medications reviewed:  Scheduled Meds:  albuterol  2.5 mg Nebulization BID    budesonide (PULMICORT) nebulizer solution  0.25 mg Nebulization BID   chlorhexidine  15 mL Mouth Rinse BID   enoxaparin (LOVENOX) injection  40 mg Subcutaneous Daily   feeding supplement (GLUCERNA SHAKE)  237 mL Oral BID BM   guaiFENesin  5 mL Oral TID   levETIRAcetam  250 mg Oral BID   levothyroxine  88 mcg Oral Q M,W,F   mouth rinse  15 mL Mouth Rinse q12n4p   pantoprazole sodium  40 mg Per Tube Daily   Continuous Infusions:    Diet Order             Diet full liquid Room service appropriate? Yes; Fluid consistency: Thin  Diet effective now                            Intake/Output Summary (Last 24 hours) at 01/30/2022 1120 Last data filed at 01/30/2022 0507 Gross per 24 hour  Intake 597 ml  Output 752 ml  Net -155 ml    Net IO Since Admission: 535 mL [01/30/22 1120]  Wt Readings from Last 3 Encounters:  01/30/22 73.7 kg  12/28/21 76.8 kg  04/16/21 83 kg     Unresulted Labs (From admission, onward)     Start     Ordered   01/25/22 0842  Urinalysis, Routine w reflex microscopic  Once,   R        01/25/22 0841          Data Reviewed: I have personally reviewed following labs and imaging studies CBC: No results for input(s): "WBC", "NEUTROABS", "HGB", "HCT", "MCV", "PLT" in the last 168 hours.  Basic Metabolic Panel: Recent Labs  Lab 01/24/22 0549 01/25/22 0644 01/29/22 0656  NA 142 145  --   K 4.5 3.6  --   CL 102 104  --   CO2 30 31  --   GLUCOSE 163* 114*  --   BUN 35* 46*  --   CREATININE 1.68* 1.90* 1.14*  CALCIUM 8.9 8.9  --   MG 2.4  --   --     GFR: Estimated Creatinine Clearance: 36.1 mL/min (A) (by C-G formula based on SCr of 1.14 mg/dL (H)). Liver Function Tests: No results for input(s): "AST", "ALT", "ALKPHOS", "BILITOT", "PROT", "ALBUMIN" in the last 168 hours.  No results for input(s): "LIPASE", "AMYLASE" in the last 168 hours. No results for input(s): "AMMONIA" in the last 168 hours. Coagulation Profile: No results for  input(s): "INR", "PROTIME" in the last 168 hours. BNP (last 3 results) No results for input(s): "PROBNP" in the last 8760 hours. HbA1C: No results for input(s): "HGBA1C" in the last 72 hours. CBG: Recent Labs  Lab 01/27/22 1810 01/27/22 2134 01/28/22 0727 01/28/22 1224 01/28/22 1647  GLUCAP 213* 116* 126* 231* 137*    Lipid Profile: No results for input(s): "CHOL", "HDL", "LDLCALC", "TRIG", "CHOLHDL", "LDLDIRECT" in the last 72 hours. Thyroid Function Tests: No results for input(s): "TSH", "T4TOTAL", "FREET4", "T3FREE", "THYROIDAB"  in the last 72 hours. Sepsis Labs: No results for input(s): "PROCALCITON", "LATICACIDVEN" in the last 168 hours.  No results found for this or any previous visit (from the past 240 hour(s)).  Antimicrobials: Anti-infectives (From admission, onward)    None      Culture/Microbiology    Component Value Date/Time   SDES URINE, CATHETERIZED 03/22/2020 1057   SPECREQUEST NONE 03/22/2020 1057   CULT  03/22/2020 1057    NO GROWTH Performed at [facility], [address] [place], [place] [address]    REPTSTATUS 03/23/2020 FINAL 03/22/2020 1057    Other culture-see note  Radiology Studies: No results found.   LOS: 7 days   Lanae Boast, MD [facility]  01/30/2022, 11:20 AM

## 2022-01-31 DIAGNOSIS — R0602 Shortness of breath: Secondary | ICD-10-CM | POA: Diagnosis not present

## 2022-01-31 MED ORDER — LEVOTHYROXINE SODIUM 88 MCG PO TABS: 88.0000 ug | ORAL_TABLET | ORAL | Status: AC

## 2022-01-31 NOTE — TOC Progression Note (Signed)
Transition of Care [facility]) - Progression Note    Patient Details  Name: Leslie Chandler MRN: [id number] Date of Birth: March 08, 1935  Transition of Care [facility]) CM/SW Contact  Preethi Scantlebury, Meriam Sprague, RN Phone Number: 01/31/2022, 11:00 AM  Clinical Narrative:    Level 2 Pasrr still pending.  Spoke with [facility] Rod Mae [phone]. She follows pt in the community for her IDD. She was given update on disposition plan of LTC SNF at dc. TOC will continue to follow.   Expected Discharge Plan: Long Term Nursing Home Barriers to Discharge: Continued Medical Work up  Expected Discharge Plan and Services Expected Discharge Plan: Long Term Nursing Home In-house Referral: Clinical Social Work Discharge Planning Services: CM Consult   Living arrangements for the past 2 months: Group Home                     Readmission Risk Interventions    01/27/2022    3:38 PM 01/24/2022    3:20 PM  Readmission Risk Prevention Plan  Transportation Screening Complete Complete  PCP or Specialist Appt within 5-7 Days Complete Complete  Home Care Screening Complete Complete  Medication Review (RN CM) Complete

## 2022-01-31 NOTE — Progress Notes (Signed)
Pt now to dc to SNF with hospice. Will sign off. Thanks.   Rolena Infante, MS [facility] SLP Acute Rehab Services Office [phone] Pager [phone]

## 2022-01-31 NOTE — Progress Notes (Signed)
PROGRESS NOTE Leslie Chandler  DVV:[id number] DOB: Jul 04, 1935 DOA: 01/22/2022 PCP: Jordan Hawks, PA-C   Brief Narrative/Hospital Course: 87 year old female with intellectual debility from group home, presents with wheezing, cough, acute hypoxia , most likely from  large hiatal hernia ( entire stomach up in chest)  Palliative care consulted and after extensive discussion plan for hospice, currently pending hospice placement.  But unable to go to group home with hospice.  Needs SNF.  TOC looking into long-term nursing home placement pending PSAAR no.  Subjective: Seen and examined this morning.  Sister at the bedside no new complaints but still has some coughing when trying to eat especially when she is not sitting upright on WC Overnight no fever. Now on soft diet per request.   Assessment and Plan: Principal Problem:   SOB (shortness of breath) Active Problems:   Asthma exacerbation   Acute respiratory failure with hypoxia (HCC)   Intellectual disability   Hypertension   Acute on chronic diastolic CHF (congestive heart failure) (HCC)   Seizure disorder (HCC)   Hypothyroidism   Large hiatal hernia   CKD (chronic kidney disease) stage 3, GFR 30-59 ml/min (HCC)   Acute hypoxemic respiratory failure (HCC)   Acute respiratory failure with hypoxia Large hiatal hernia: Hypoxia multifactorial with history of reflux, large hiatal hernia causing wheezing, less likely asthma exacerbation has had asthma diagnosed diffuse ago prior to that she never had issue with asthma.  Doing well off oxygen for steroid.  Continue PPI continue diet as tolerated on soft diet, continue aspiration precaution.   Acute on chronic diastolic CHF: With bilateral pitting edema, no DVT on ultrasound, only on oral Lasix 20 twice daily at home received IV Lasix x4 doses,w/ resolution of edema.  Doing well.  CKD stage IIIb Renal function is at baseline. Lasix and lisinopril on hold.  Intellectual disability Memory  problem Failure to thrive Long-term resident of group home Baseline born to wheelchair not able to stand by herself: cont supportive care fall precaution, she is from group home.  History of seizure disorder cont home Keppra  Hypertension: Elevated on presentation currently stable, not on meds Hypothyroidism: Continue current Synthroid 88 mcg  DVT prophylaxis: enoxaparin (LOVENOX) injection 40 mg Start: 01/29/22 1000 Code Status:   Code Status: DNR Family Communication: plan of care discussed with sister at bedside. Patient status is: Inpatient because of pending placement to SNF with hospice Level of care: Med-Surg   Dispo: The patient is from: Group home            Anticipated disposition: SNF/NH once bed available, pending PSAAR no and placement  Mobility Assessment (last 72 hours)     Mobility Assessment     Row Name 01/30/22 0730           Does patient have an order for bedrest or is patient medically unstable Yes- Bedfast (Level 1) - Complete       What is the highest level of mobility based on the progressive mobility assessment? Level 1 (Bedfast) - Unable to balance while sitting on edge of bed                 Objective: Vitals last 24 hrs: Vitals:   01/30/22 1426 01/30/22 2043 01/31/22 0513 01/31/22 0845  BP: (!) 149/81 (!) 156/87 (!) 134/59   Pulse: 75 92 (!) 58   Resp: 20 14 16    Temp: 98 F (36.7 C) 97.8 F (36.6 C) 98.6 F (37 C)   TempSrc:  Oral Oral   SpO2: 95% 92% 99% 93%  Weight:      Height:       Weight change:   Physical Examination: General exam: AAX2, elderly, frail, debilitated older than stated age, weak appearing. HEENT:Oral mucosa moist, Ear/Nose WNL grossly, dentition normal. Respiratory system: bilaterally diminished, no use of accessory muscle Cardiovascular system: S1 & S2 +, No JVD,. Gastrointestinal system: Abdomen soft,NT,ND,BS+ Nervous System:Alert, awake, moving extremities and grossly nonfocal Extremities: LE ankle  edema MILD, distal peripheral pulses palpable.  Skin: No rashes,no icterus. MSK: Normal muscle bulk,tone, power   Medications reviewed:  Scheduled Meds:  albuterol  2.5 mg Nebulization BID   budesonide (PULMICORT) nebulizer solution  0.25 mg Nebulization BID   chlorhexidine  15 mL Mouth Rinse BID   enoxaparin (LOVENOX) injection  40 mg Subcutaneous Daily   feeding supplement (GLUCERNA SHAKE)  237 mL Oral BID BM   guaiFENesin  5 mL Oral TID   levETIRAcetam  250 mg Oral BID   levothyroxine  88 mcg Oral Q M,W,F   mouth rinse  15 mL Mouth Rinse q12n4p   pantoprazole sodium  40 mg Per Tube Daily   Continuous Infusions:    Diet Order             DIET SOFT Room service appropriate? Yes; Fluid consistency: Thin  Diet effective now                            Intake/Output Summary (Last 24 hours) at 01/31/2022 1013 Last data filed at 01/31/2022 0500 Gross per 24 hour  Intake --  Output 900 ml  Net -900 ml   Net IO Since Admission: 412 mL [01/31/22 1013]  Wt Readings from Last 3 Encounters:  01/30/22 73.7 kg  12/28/21 76.8 kg  04/16/21 83 kg     Unresulted Labs (From admission, onward)     Start     Ordered   01/25/22 0842  Urinalysis, Routine w reflex microscopic  Once,   R        01/25/22 0841          Data Reviewed: I have personally reviewed following labs and imaging studies CBC: No results for input(s): "WBC", "NEUTROABS", "HGB", "HCT", "MCV", "PLT" in the last 168 hours.  Basic Metabolic Panel: Recent Labs  Lab 01/25/22 0644 01/29/22 0656  NA 145  --   K 3.6  --   CL 104  --   CO2 31  --   GLUCOSE 114*  --   BUN 46*  --   CREATININE 1.90* 1.14*  CALCIUM 8.9  --    GFR: Estimated Creatinine Clearance: 36.1 mL/min (A) (by C-G formula based on SCr of 1.14 mg/dL (H)). Liver Function Tests: No results for input(s): "AST", "ALT", "ALKPHOS", "BILITOT", "PROT", "ALBUMIN" in the last 168 hours.  No results for input(s): "LIPASE", "AMYLASE" in the  last 168 hours. No results for input(s): "AMMONIA" in the last 168 hours. Coagulation Profile: No results for input(s): "INR", "PROTIME" in the last 168 hours. BNP (last 3 results) No results for input(s): "PROBNP" in the last 8760 hours. HbA1C: No results for input(s): "HGBA1C" in the last 72 hours. CBG: Recent Labs  Lab 01/27/22 1810 01/27/22 2134 01/28/22 0727 01/28/22 1224 01/28/22 1647  GLUCAP 213* 116* 126* 231* 137*   Lipid Profile: No results for input(s): "CHOL", "HDL", "LDLCALC", "TRIG", "CHOLHDL", "LDLDIRECT" in the last 72 hours. Thyroid Function Tests: No results for  input(s): "TSH", "T4TOTAL", "FREET4", "T3FREE", "THYROIDAB" in the last 72 hours. Sepsis Labs: No results for input(s): "PROCALCITON", "LATICACIDVEN" in the last 168 hours.  No results found for this or any previous visit (from the past 240 hour(s)).  Antimicrobials: Anti-infectives (From admission, onward)    None      Culture/Microbiology    Component Value Date/Time   SDES URINE, CATHETERIZED 03/22/2020 1057   SPECREQUEST NONE 03/22/2020 1057   CULT  03/22/2020 1057    NO GROWTH Performed at [facility] [address] [place], [place] [address]    REPTSTATUS 03/23/2020 FINAL 03/22/2020 1057    Other culture-see note  Radiology Studies: No results found.   LOS: 8 days   Antonieta Pert, MD [facility]  01/31/2022, 10:13 AM

## 2022-02-01 DIAGNOSIS — R0602 Shortness of breath: Secondary | ICD-10-CM | POA: Diagnosis not present

## 2022-02-01 NOTE — Progress Notes (Signed)
PROGRESS NOTE Leslie Chandler  [id number] DOB: 1934/10/05 DOA: 01/22/2022 PCP: Mindi Curling, PA-C   Brief Narrative/Hospital Course: 87 year old female with intellectual debility from group home, presents with wheezing, cough, acute hypoxia , most likely from  large hiatal hernia ( entire stomach up in chest)  Palliative care consulted and after extensive discussion plan for hospice, currently pending hospice placement.  But unable to go to group home with hospice.  Needs SNF.  TOC looking into long-term nursing home placement pending PSAAR no.  Subjective: Seen this morning, sister at the bedside, patient underwent surgery.  It was not    Assessment and Plan: Principal Problem:   SOB (shortness of breath) Active Problems:   Asthma exacerbation   Acute respiratory failure with hypoxia (HCC)   Intellectual disability   Hypertension   Acute on chronic diastolic CHF (congestive heart failure) (HCC)   Seizure disorder (HCC)   Hypothyroidism   Large hiatal hernia   CKD (chronic kidney disease) stage 3, GFR 30-59 ml/min (HCC)   Acute hypoxemic respiratory failure (HCC)   Acute respiratory failure with hypoxia Large hiatal hernia: Hypoxia multifactorial with history of reflux, large hiatal hernia causing wheezing, less likely asthma exacerbation has had asthma diagnosed diffuse ago prior to that she never had issue with asthma.  Doing well off oxygen for steroid.  Continue PPI continue diet as tolerated on soft diet, continue aspiration precaution.  Overall stable  Acute on chronic diastolic CHF: With bilateral pitting edema, no DVT on ultrasound, only on oral Lasix 20 twice daily at home received IV Lasix x4 doses,w/ resolution of edema.  CKD stage IIIb:Renal function is at baseline. Lasix and lisinopril on hold.  Intellectual disability Memory problem Failure to thrive Long-term resident of group home Baseline born to wheelchair not able to stand by herself: cont supportive care  fall precaution, she is from group home.  History of seizure disorder cont home Keppra  Hypertension: Elevated on presentation currently stable, not on meds Hypothyroidism: Continue current Synthroid 88 mcg  DVT prophylaxis: enoxaparin (LOVENOX) injection 40 mg Start: 01/29/22 1000 Code Status:   Code Status: DNR Family Communication: plan of care discussed with sister at bedside. Patient status is: Inpatient because of pending placement to SNF with hospice Level of care: Med-Surg   Dispo: The patient is from: Group home            Anticipated disposition: LTC SNF once bed available, pending level 2 pasrr  Mobility Assessment (last 72 hours)     Mobility Assessment     Row Name 01/31/22 2032 01/30/22 0730         Does patient have an order for bedrest or is patient medically unstable Yes- Bedfast (Level 1) - Complete Yes- Bedfast (Level 1) - Complete      What is the highest level of mobility based on the progressive mobility assessment? Level 2 (Chairfast) - Balance while sitting on edge of bed and cannot stand Level 1 (Bedfast) - Unable to balance while sitting on edge of bed      Is the above level different from baseline mobility prior to current illness? No - Consider discontinuing PT/OT --                Objective: Vitals last 24 hrs: Vitals:   01/31/22 1944 01/31/22 2047 02/01/22 0601 02/01/22 0753  BP:  (!) 160/73 (!) 143/83   Pulse:  84 76   Resp:  14 18   Temp:  97.8 F (36.6  C) (!) 97.4 F (36.3 C)   TempSrc:  Oral Oral   SpO2: 96% 95% 95% 95%  Weight:   70.4 kg   Height:       Weight change:   Physical Examination: General exam: AA, with baseline intellectual disability.   HEENT:Oral mucosa moist, Ear/Nose WNL grossly, dentition normal. Respiratory system: bilaterally diminished,no use of accessory muscle Cardiovascular system: S1 & S2 +, No JVD,. Gastrointestinal system: Abdomen soft,NT,ND, BS+ Nervous System:Alert, awake, moving extremities and  grossly nonfocal Extremities: edema neg,distal peripheral pulses palpable.  Skin: No rashes,no icterus. MSK: Normal muscle bulk,tone, power   Medications reviewed:  Scheduled Meds:  albuterol  2.5 mg Nebulization BID   budesonide (PULMICORT) nebulizer solution  0.25 mg Nebulization BID   chlorhexidine  15 mL Mouth Rinse BID   enoxaparin (LOVENOX) injection  40 mg Subcutaneous Daily   feeding supplement (GLUCERNA SHAKE)  237 mL Oral BID BM   guaiFENesin  5 mL Oral TID   levETIRAcetam  250 mg Oral BID   levothyroxine  88 mcg Oral Q M,W,F   mouth rinse  15 mL Mouth Rinse q12n4p   pantoprazole sodium  40 mg Per Tube Daily   Continuous Infusions:    Diet Order             DIET SOFT Room service appropriate? Yes; Fluid consistency: Thin  Diet effective now                            Intake/Output Summary (Last 24 hours) at 02/01/2022 1118 Last data filed at 02/01/2022 0500 Gross per 24 hour  Intake 300 ml  Output 200 ml  Net 100 ml    Net IO Since Admission: 752 mL [02/01/22 1118]  Wt Readings from Last 3 Encounters:  02/01/22 70.4 kg  12/28/21 76.8 kg  04/16/21 83 kg     Unresulted Labs (From admission, onward)    None     Data Reviewed: I have personally reviewed following labs and imaging studies CBC: No results for input(s): "WBC", "NEUTROABS", "HGB", "HCT", "MCV", "PLT" in the last 168 hours.  Basic Metabolic Panel: Recent Labs  Lab 01/29/22 0656  CREATININE 1.14*    GFR: Estimated Creatinine Clearance: 35.4 mL/min (A) (by C-G formula based on SCr of 1.14 mg/dL (H)). Liver Function Tests: No results for input(s): "AST", "ALT", "ALKPHOS", "BILITOT", "PROT", "ALBUMIN" in the last 168 hours.  No results for input(s): "LIPASE", "AMYLASE" in the last 168 hours. No results for input(s): "AMMONIA" in the last 168 hours. Coagulation Profile: No results for input(s): "INR", "PROTIME" in the last 168 hours. BNP (last 3 results) No results for  input(s): "PROBNP" in the last 8760 hours. HbA1C: No results for input(s): "HGBA1C" in the last 72 hours. CBG: Recent Labs  Lab 01/27/22 1810 01/27/22 2134 01/28/22 0727 01/28/22 1224 01/28/22 1647  GLUCAP 213* 116* 126* 231* 137*    Lipid Profile: No results for input(s): "CHOL", "HDL", "LDLCALC", "TRIG", "CHOLHDL", "LDLDIRECT" in the last 72 hours. Thyroid Function Tests: No results for input(s): "TSH", "T4TOTAL", "FREET4", "T3FREE", "THYROIDAB" in the last 72 hours. Sepsis Labs: No results for input(s): "PROCALCITON", "LATICACIDVEN" in the last 168 hours.  No results found for this or any previous visit (from the past 240 hour(s)).  Antimicrobials: Anti-infectives (From admission, onward)    None      Culture/Microbiology    Component Value Date/Time   SDES URINE, CATHETERIZED 03/22/2020 1057   SPECREQUEST  NONE 03/22/2020 1057   CULT  03/22/2020 1057    NO GROWTH Performed at [facility], [address], [place], [place] [address]    REPTSTATUS 03/23/2020 FINAL 03/22/2020 1057    Other culture-see note  Radiology Studies: No results found.   LOS: 9 days   Lanae Boast, MD [facility]  02/01/2022, 11:18 AM

## 2022-02-02 DIAGNOSIS — R0602 Shortness of breath: Secondary | ICD-10-CM | POA: Diagnosis not present

## 2022-02-03 DIAGNOSIS — R0602 Shortness of breath: Secondary | ICD-10-CM | POA: Diagnosis not present

## 2022-02-03 NOTE — Care Management Important Message (Signed)
Important Message  Patient Details IM Letter placed in Patients room. Name: Leslie Chandler MRN: [id number] Date of Birth: 1935-03-24   Medicare Important Message Given:  Yes     Caren Macadam 02/03/2022, 10:19 AM

## 2022-02-03 NOTE — Progress Notes (Signed)
PROGRESS NOTE Leslie Chandler  [id number] DOB: 01-30-1935 DOA: 01/22/2022 PCP: Mindi Curling, PA-C   Brief Narrative/Hospital Course: 87 year old female with intellectual debility from group home, presents with wheezing, cough, acute hypoxia , most likely from  large hiatal hernia ( entire stomach up in chest)  Palliative care consulted and after extensive discussion plan for hospice, currently pending hospice placement.  But unable to go to group home with hospice.  Needs SNF.  TOC looking into long-term nursing home placement  level 2 PSAAR obtained 6/19, awaiting snf approval  Subjective: Seen and examined this morning sister and niece at the bedside no complaints mild coughing While eating otherwise tolerating oral well.    Assessment and Plan: Principal Problem:   SOB (shortness of breath) Active Problems:   Asthma exacerbation   Acute respiratory failure with hypoxia (HCC)   Intellectual disability   Hypertension   Acute on chronic diastolic CHF (congestive heart failure) (HCC)   Seizure disorder (HCC)   Hypothyroidism   Large hiatal hernia   CKD (chronic kidney disease) stage 3, GFR 30-59 ml/min (HCC)   Acute hypoxemic respiratory failure (HCC)   Acute respiratory failure with hypoxia Large hiatal hernia: Hypoxia multifactorial with history of reflux, large hiatal hernia causing wheezing, less likely asthma exacerbation has had asthma diagnosed years ago, prior to that she never had issue with asthma.  Doing well off oxygen.no more wheezing, has been off prednisone, doing well on oral diet and PPI.  Continue with aspiration precaution feeding in upright position only   Acute on chronic diastolic [id number] bilateral pitting edema, no DVT on ultrasound, only on oral Lasix 20 twice daily at home received IV Lasix x4 doses,w/ resolution of edema.  CKD stage IIIb:Renal function is at baseline. Lasix and lisinopril on hold.  Intellectual disability Memory problem Failure to  thrive Long-term resident of group home Baseline born to wheelchair not able to stand by herself: At baseline mental status, cont supportive care fall precaution, she is from group home.  History of seizure disorder cont home Keppra  Hypertension: Elevated on presentation , doing well fairly at this time  Hypothyroidism: Continue current Synthroid 88 mcg  DVT prophylaxis: enoxaparin (LOVENOX) injection 40 mg Start: 01/29/22 1000 Code Status:   Code Status: DNR Family Communication: plan of care discussed with sister at bedside. Patient status is: Inpatient because of pending placement to SNF with hospice Level of care: Med-Surg   Dispo: The patient is from: Group home            Anticipated disposition: LTC SNF once bed available  Mobility Assessment (last 72 hours)     Mobility Assessment     Row Name 02/03/22 0738 02/02/22 1139 01/31/22 2032       Does patient have an order for bedrest or is patient medically unstable No - Continue assessment No - Continue assessment Yes- Bedfast (Level 1) - Complete     What is the highest level of mobility based on the progressive mobility assessment? Level 2 (Chairfast) - Balance while sitting on edge of bed and cannot stand Level 2 (Chairfast) - Balance while sitting on edge of bed and cannot stand Level 2 (Chairfast) - Balance while sitting on edge of bed and cannot stand     Is the above level different from baseline mobility prior to current illness? No - Consider discontinuing PT/OT -- No - Consider discontinuing PT/OT               Objective: Vitals  last 24 hrs: Vitals:   02/02/22 2100 02/03/22 0553 02/03/22 0756 02/03/22 1317  BP: (!) 157/73 (!) 159/87  137/71  Pulse: 88 77  63  Resp: 14 14  18   Temp: 98 F (36.7 C) (!) 97.5 F (36.4 C)  98.2 F (36.8 C)  TempSrc: Oral Oral  Oral  SpO2: 93% 94% 93% 98%  Weight:      Height:       Weight change:   Physical Examination: General exam: AAx2, older than stated age, weak  appearing. HEENT:Oral mucosa moist, Ear/Nose WNL grossly, dentition normal. Respiratory system: bilaterally diminished, no use of accessory muscle Cardiovascular system: S1 & S2 +, No JVD,. Gastrointestinal system: Abdomen soft,NT,ND,BS+ Nervous System:Alert, awake, moving extremities and grossly nonfocal Extremities: LE ankle edema neg, distal peripheral pulses palpable.  Skin: No rashes,no icterus. MSK: Normal muscle bulk,tone, power   Medications reviewed:  Scheduled Meds:  albuterol  2.5 mg Nebulization BID   budesonide (PULMICORT) nebulizer solution  0.25 mg Nebulization BID   chlorhexidine  15 mL Mouth Rinse BID   enoxaparin (LOVENOX) injection  40 mg Subcutaneous Daily   feeding supplement (GLUCERNA SHAKE)  237 mL Oral BID BM   guaiFENesin  5 mL Oral TID   levETIRAcetam  250 mg Oral BID   levothyroxine  88 mcg Oral Q M,W,F   mouth rinse  15 mL Mouth Rinse q12n4p   pantoprazole sodium  40 mg Per Tube Daily   Continuous Infusions:    Diet Order             DIET SOFT Room service appropriate? Yes; Fluid consistency: Thin  Diet effective now                            Intake/Output Summary (Last 24 hours) at 02/03/2022 1354 Last data filed at 02/03/2022 1017 Gross per 24 hour  Intake 480 ml  Output 400 ml  Net 80 ml   Net IO Since Admission: 1,009 mL [02/03/22 1354]  Wt Readings from Last 3 Encounters:  02/02/22 73.4 kg  12/28/21 76.8 kg  04/16/21 83 kg     Unresulted Labs (From admission, onward)    None     Data Reviewed: I have personally reviewed following labs and imaging studies CBC: No results for input(s): "WBC", "NEUTROABS", "HGB", "HCT", "MCV", "PLT" in the last 168 hours.  Basic Metabolic Panel: Recent Labs  Lab 01/29/22 0656  CREATININE 1.14*   GFR: Estimated Creatinine Clearance: 36.1 mL/min (A) (by C-G formula based on SCr of 1.14 mg/dL (H)). Liver Function Tests: No results for input(s): "AST", "ALT", "ALKPHOS", "BILITOT",  "PROT", "ALBUMIN" in the last 168 hours.  No results for input(s): "LIPASE", "AMYLASE" in the last 168 hours. No results for input(s): "AMMONIA" in the last 168 hours. Coagulation Profile: No results for input(s): "INR", "PROTIME" in the last 168 hours. BNP (last 3 results) No results for input(s): "PROBNP" in the last 8760 hours. HbA1C: No results for input(s): "HGBA1C" in the last 72 hours. CBG: Recent Labs  Lab 01/27/22 1810 01/27/22 2134 01/28/22 0727 01/28/22 1224 01/28/22 1647  GLUCAP 213* 116* 126* 231* 137*   Lipid Profile: No results for input(s): "CHOL", "HDL", "LDLCALC", "TRIG", "CHOLHDL", "LDLDIRECT" in the last 72 hours. Thyroid Function Tests: No results for input(s): "TSH", "T4TOTAL", "FREET4", "T3FREE", "THYROIDAB" in the last 72 hours. Sepsis Labs: No results for input(s): "PROCALCITON", "LATICACIDVEN" in the last 168 hours.  No results found for  this or any previous visit (from the past 240 hour(s)).  Antimicrobials: Anti-infectives (From admission, onward)    None      Culture/Microbiology    Component Value Date/Time   SDES URINE, CATHETERIZED 03/22/2020 1057   SPECREQUEST NONE 03/22/2020 1057   CULT  03/22/2020 1057    NO GROWTH Performed at [facility], [address], [place], [place] [address]    REPTSTATUS 03/23/2020 FINAL 03/22/2020 1057    Other culture-see note  Radiology Studies: No results found.   LOS: 11 days   Lanae Boast, MD [facility]  02/03/2022, 1:54 PM

## 2022-02-03 NOTE — TOC Progression Note (Addendum)
Transition of Care [facility]) - Progression Note    Patient Details  Name: Leslie Chandler MRN: [id number] Date of Birth: November 13, 1934  [facility] CM/SW Contact  Jaiyanna Safran, Meriam Sprague, RN Phone Number: 02/03/2022, 10:53 AM  Clinical Narrative:    Pasrr number received: [phone]  Sister chose [facility]. Spoke with [facility] liaison who is checking to see if pt's Medicaid has been switched over the long term Medicaid. TOC will follow.  Addendum:14:45 [facility] states that pt does not have long term care Medicaid yet, will need to switch over after dc. [place] speaking with corporate office to see if they can take her under her Medicare.    Expected Discharge Plan: Long Term Nursing Home Barriers to Discharge: Continued Medical Work up  Expected Discharge Plan and Services Expected Discharge Plan: Long Term Nursing Home In-house Referral: Clinical Social Work Discharge Planning Services: CM Consult   Living arrangements for the past 2 months: Group Home                  Readmission Risk Interventions    01/27/2022    3:38 PM 01/24/2022    3:20 PM  Readmission Risk Prevention Plan  Transportation Screening Complete Complete  PCP or Specialist Appt within 5-7 Days Complete Complete  Home Care Screening Complete Complete  Medication Review (RN CM) Complete

## 2022-02-04 DIAGNOSIS — R0602 Shortness of breath: Secondary | ICD-10-CM | POA: Diagnosis not present

## 2022-02-04 NOTE — Progress Notes (Signed)
Called report to [facility] and placed AVS discharge packet in chart for PTAR and receiving facility. Patient's family had no further questions or needs at discharge.

## 2022-02-04 NOTE — Discharge Summary (Signed)
Physician Discharge Summary  Leslie Chandler [id number] DOB: 25-Feb-1935 DOA: 01/22/2022  PCP: Mindi Curling, PA-C  Admit date: 01/22/2022 Discharge date: 02/04/2022 Recommendations for Outpatient Follow-up:  Follow up with PCP in 1 weeks-call for appointment Please obtain BMP/CBC in one week  Discharge Dispo: Skilled nursing facility Discharge Condition: Stable Code Status:   Code Status: DNR Diet recommendation:  Diet Order             DIET SOFT Room service appropriate? Yes; Fluid consistency: Thin  Diet effective now                    Brief/Interim Summary: 87 year old female with intellectual debility from group home, presents with wheezing, cough, acute hypoxia , most likely from  large hiatal hernia ( entire stomach up in chest)  Palliative care consulted and after extensive discussion plan for hospice, currently pending hospice placement.  But unable to go to group home with hospice.  Needs SNF.  TOC looking into long-term nursing home placement  level 2 PSAAR obtained 6/19, awaiting snf approval  At this time skilled nursing facility has been procured and approved and is being discharged in medically stable condition, advised outpatient follow-up with palliative care.  Discharge Diagnoses:  Principal Problem:   SOB (shortness of breath) Active Problems:   Asthma exacerbation   Acute respiratory failure with hypoxia (HCC)   Intellectual disability   Hypertension   Acute on chronic diastolic CHF (congestive heart failure) (HCC)   Seizure disorder (HCC)   Hypothyroidism   Large hiatal hernia   CKD (chronic kidney disease) stage 3, GFR 30-59 ml/min (HCC)   Acute hypoxemic respiratory failure (HCC)  Acute respiratory failure with hypoxia Large hiatal hernia: Hypoxia multifactorial with history of reflux, large hiatal hernia causing wheezing, less likely asthma exacerbation has had asthma diagnosed years ago, prior to that she never had issue with asthma.  Doing well off  oxygen.no more wheezing, has been off prednisone, doing well on oral diet and PPI.  Continue with aspiration precaution feeding in upright position only    Acute on chronic diastolic [id number] bilateral pitting edema, no DVT on ultrasound, only on oral Lasix 20 twice daily at home received IV Lasix x4 doses,w/ resolution of edema.  Holding Lasix for now   CKD stage IIIb:Renal function is at baseline. Lasix and lisinopril on hold.   Intellectual disability Memory problem Failure to thrive Long-term resident of group home Baseline born to wheelchair not able to stand by herself: At baseline mental status, cont supportive care fall precaution, she is from group home.   History of seizure disorder cont home Keppra   Hypertension: Elevated on presentation , doing well fairly at this time  Hypothyroidism: Continue current Synthroid 88 mcg  Consults: Palliative care Subjective: Alert awake pleasant at baseline sisters at the bedside  Discharge Exam: Vitals:   02/04/22 0533 02/04/22 0749  BP: (!) 155/81   Pulse: 76   Resp: 18   Temp: 97.7 F (36.5 C)   SpO2: 92% 92%   General: Pt is alert, awake, not in acute distress Cardiovascular: RRR, S1/S2 +, no rubs, no gallops Respiratory: CTA bilaterally, no wheezing, no rhonchi Abdominal: Soft, NT, ND, bowel sounds + Extremities: no edema, no cyanosis  Discharge Instructions  Discharge Instructions     Discharge instructions   Complete by: As directed    Follow-up with palliative care  Please call call MD or return to ER for similar or worsening recurring problem that brought  you to hospital or if any fever,nausea/vomiting,abdominal pain, uncontrolled pain, chest pain,  shortness of breath or any other alarming symptoms.  Please follow-up your doctor as instructed in a week time and call the office for appointment.  Please avoid alcohol, smoking, or any other illicit substance and maintain healthy habits including taking your  regular medications as prescribed.  You were cared for by a hospitalist during your hospital stay. If you have any questions about your discharge medications or the care you received while you were in the hospital after you are discharged, you can call the unit and ask to speak with the hospitalist on call if the hospitalist that took care of you is not available.  Once you are discharged, your primary care physician will handle any further medical issues. Please note that NO REFILLS for any discharge medications will be authorized once you are discharged, as it is imperative that you return to your primary care physician (or establish a relationship with a primary care physician if you do not have one) for your aftercare needs so that they can reassess your need for medications and monitor your lab values   Discharge instructions   Complete by: As directed    Follow-up with palliative care in the facility Please call call MD or return to ER for similar or worsening recurring problem that brought you to hospital or if any fever,nausea/vomiting,abdominal pain, uncontrolled pain, chest pain,  shortness of breath or any other alarming symptoms.  Please follow-up your doctor as instructed in a week time and call the office for appointment.  Please avoid alcohol, smoking, or any other illicit substance and maintain healthy habits including taking your regular medications as prescribed.  You were cared for by a hospitalist during your hospital stay. If you have any questions about your discharge medications or the care you received while you were in the hospital after you are discharged, you can call the unit and ask to speak with the hospitalist on call if the hospitalist that took care of you is not available.  Once you are discharged, your primary care physician will handle any further medical issues. Please note that NO REFILLS for any discharge medications will be authorized once you are discharged,  as it is imperative that you return to your primary care physician (or establish a relationship with a primary care physician if you do not have one) for your aftercare needs so that they can reassess your need for medications and monitor your lab values   Increase activity slowly   Complete by: As directed       Allergies as of 02/04/2022   No Known Allergies      Medication List     STOP taking these medications    Aqueous Vitamin D 10 MCG/ML Liqd Generic drug: cholecalciferol   furosemide 20 MG tablet Commonly known as: LASIX   ibuprofen 200 MG tablet Commonly known as: ADVIL   lisinopril 10 MG tablet Commonly known as: ZESTRIL   metFORMIN 500 MG 24 hr tablet Commonly known as: GLUCOPHAGE-XR   metFORMIN 500 MG tablet Commonly known as: GLUCOPHAGE   metoprolol tartrate 25 MG tablet Commonly known as: LOPRESSOR   potassium chloride 10 MEQ tablet Commonly known as: KLOR-CON       TAKE these medications    acetaminophen 500 MG tablet Commonly known as: TYLENOL Take 500 mg by mouth every 6 (six) hours as needed for mild pain or headache.   albuterol (2.5 MG/3ML) 0.083% nebulizer  solution Commonly known as: PROVENTIL Take 2.5 mg by nebulization every 4 (four) hours as needed for wheezing or shortness of breath.   aspirin 81 MG chewable tablet Chew 81 mg by mouth daily.   bacitracin 500 UNIT/GM ointment Apply 1 application. topically daily as needed for wound care.   calamine lotion Apply 1 application topically daily as needed for itching.   dimenhyDRINATE 50 MG tablet Commonly known as: DRAMAMINE Take 50 mg by mouth every 8 (eight) hours as needed for dizziness or nausea (as directed).   diphenhydrAMINE 25 MG tablet Commonly known as: BENADRYL Take 25 mg by mouth daily as needed for itching.   Ethyl Alcohol (Skin Cleanser) 70 % Liqd Apply 1 application. topically daily as needed (wound care).   Farxiga 5 MG Tabs tablet Generic drug: dapagliflozin  propanediol Take 5 mg by mouth daily.   feeding supplement (GLUCERNA SHAKE) Liqd Take 237 mLs by mouth 2 (two) times daily between meals.   ferrous sulfate 220 (44 Fe) MG/5ML solution Take 220 mg by mouth every evening.   fluticasone 110 MCG/ACT inhaler Commonly known as: FLOVENT HFA Inhale 2 puffs into the lungs 2 (two) times daily.   guaiFENesin-dextromethorphan 100-10 MG/5ML syrup Commonly known as: ROBITUSSIN DM Take 10 mLs by mouth every 6 (six) hours as needed for cough.   hydrocortisone cream 1 % Apply 1 application topically daily as needed for itching.   hydrogen peroxide 3 % external solution Apply 1 application. topically daily as needed (wound care).   levETIRAcetam 100 MG/ML solution Commonly known as: KEPPRA Take 2.5 mLs by mouth 2 (two) times daily.   levothyroxine 88 MCG tablet Commonly known as: SYNTHROID Take 1 tablet (88 mcg total) by mouth every Monday, Wednesday, and Friday. What changed: when to take this   loperamide 2 MG tablet Commonly known as: IMODIUM A-D Take 2 mg by mouth 4 (four) times daily as needed for diarrhea or loose stools.   magnesium hydroxide 400 MG/5ML suspension Commonly known as: MILK OF MAGNESIA Take 10 mLs by mouth daily as needed for mild constipation.   montelukast 10 MG tablet Commonly known as: SINGULAIR Take 10 mg by mouth every evening.   pantoprazole 40 MG tablet Commonly known as: PROTONIX Take 40 mg by mouth daily.   PEPCID PO Take 1 tablet by mouth daily as needed (indigestion).   Pepto-Bismol Max Strength 525 MG/15ML Susp Generic drug: Bismuth Subsalicylate Take 0000000 mLs by mouth daily as needed (for indigestion).   petrolatum-hydrophilic-aloe vera ointment Apply 1 application. topically 2 (two) times daily as needed for wound care.   pravastatin 20 MG tablet Commonly known as: PRAVACHOL Take 20 mg by mouth every evening.   SUDAFED PE PRESSURE+PAIN+COUGH PO Take 5 mLs by mouth every 6 (six) hours as  needed (for symptoms).   Viactiv Digestive Health Chew Chew 1 tablet by mouth daily.        Contact information for follow-up providers     Mindi Curling, PA-C Follow up in 1 week(s).   Specialty: Physician Assistant Contact information: [address] [place] [address] [phone]              Contact information for after-discharge care     Destination     [facility] .   Service: Skilled Nursing Contact information: [address] [place] [phone]  No Known Allergies  The results of significant diagnostics from this hospitalization (including imaging, microbiology, ancillary and laboratory) are listed below for reference.    Microbiology: No results found for this or any previous visit (from the past 240 hour(s)).  Procedures/Studies: VAS [place] LOWER EXTREMITY VENOUS (DVT)  Result Date: 01/23/2022  Lower Venous DVT Study Patient Name:  Leslie Chandler  Date of Exam:   01/23/2022 Medical Rec #: [id number]   Accession #:    [id number] Date of Birth: 1935/04/05   Patient Gender: F Patient Age:   6 years Exam Location:  [facility] Procedure:      VAS [place] LOWER EXTREMITY VENOUS (DVT) Referring Phys: Annamaria Boots XU --------------------------------------------------------------------------------  Indications: Edema.  Risk Factors: Immobility. Limitations: Body habitus, poor ultrasound/tissue interface and inability to cooperate due to mental status. Comparison Study: Previous exam of RLE on 04/11/21 was negative for DVT. Performing Technologist: Rogelia Rohrer RVT, RDMS  Examination Guidelines: A complete evaluation includes B-mode imaging, spectral Doppler, color Doppler, and power Doppler as needed of all accessible portions of each vessel. Bilateral testing is considered an integral part of a complete examination. Limited examinations for reoccurring indications may be performed as  noted. The reflux portion of the exam is performed with the patient in reverse Trendelenburg.  +---------+---------------+---------+-----------+----------+-------------------+ RIGHT    CompressibilityPhasicitySpontaneityPropertiesThrombus Aging      +---------+---------------+---------+-----------+----------+-------------------+ CFV      Full           Yes      Yes                                      +---------+---------------+---------+-----------+----------+-------------------+ SFJ      Full                                                             +---------+---------------+---------+-----------+----------+-------------------+ FV Prox  Full           Yes      Yes                                      +---------+---------------+---------+-----------+----------+-------------------+ FV Mid   Full           Yes      Yes                                      +---------+---------------+---------+-----------+----------+-------------------+ FV Distal                                             Not visualized      +---------+---------------+---------+-----------+----------+-------------------+ PFV                                                   Not visualized      +---------+---------------+---------+-----------+----------+-------------------+ POP  Full           Yes      Yes                                      +---------+---------------+---------+-----------+----------+-------------------+ PTV      Full                                         Not well visualized +---------+---------------+---------+-----------+----------+-------------------+ PERO     Full                                         Not well visualized +---------+---------------+---------+-----------+----------+-------------------+   Right Technical Findings: Not visualized segments include PFV and FV (distal).   +--------+---------------+---------+-----------+----------+--------------------+ LEFT    CompressibilityPhasicitySpontaneityPropertiesThrombus Aging       +--------+---------------+---------+-----------+----------+--------------------+ CFV     Full           Yes      Yes                                       +--------+---------------+---------+-----------+----------+--------------------+ SFJ     Full                                                              +--------+---------------+---------+-----------+----------+--------------------+ FV Prox Full           Yes      Yes                                       +--------+---------------+---------+-----------+----------+--------------------+ FV Mid  Full           Yes      Yes                                       +--------+---------------+---------+-----------+----------+--------------------+ FV                     Yes      Yes                  patent by            Distal                                               color/doppler        +--------+---------------+---------+-----------+----------+--------------------+ PFV     Full                                                              +--------+---------------+---------+-----------+----------+--------------------+  POP     Full           Yes      Yes                                       +--------+---------------+---------+-----------+----------+--------------------+ PTV     Full                                         Not well visualized  +--------+---------------+---------+-----------+----------+--------------------+ PERO    Full                                         Not well visualized  +--------+---------------+---------+-----------+----------+--------------------+     Summary: BILATERAL: - No evidence of deep vein thrombosis seen in the lower extremities, bilaterally in areas visualized. -No evidence of popliteal cyst,  bilaterally.   *See table(s) above for measurements and observations. Electronically signed by Sherald Hess MD on 01/23/2022 at 3:06:15 PM.    Final    DG Chest 2 View  Result Date: 01/22/2022 CLINICAL DATA:  Shortness of breath EXAM: CHEST - 2 VIEW COMPARISON:  12/28/2021 FINDINGS: Bibasilar atelectasis. No focal consolidation. No pleural effusion or pneumothorax. Heart and mediastinal contours are unremarkable. Large hiatal hernia. No acute osseous abnormality. IMPRESSION: 1. No active cardiopulmonary disease. Electronically Signed   By: Elige Ko M.D.   On: 01/22/2022 15:33    Labs: BNP (last 3 results) Recent Labs    04/10/21 1510 12/28/21 1322 01/22/22 1547  BNP 192.6* 209.6* 95.2   Basic Metabolic Panel: Recent Labs  Lab 01/29/22 0656  CREATININE 1.14*   Liver Function Tests: No results for input(s): "AST", "ALT", "ALKPHOS", "BILITOT", "PROT", "ALBUMIN" in the last 168 hours. No results for input(s): "LIPASE", "AMYLASE" in the last 168 hours. No results for input(s): "AMMONIA" in the last 168 hours. CBC: No results for input(s): "WBC", "NEUTROABS", "HGB", "HCT", "MCV", "PLT" in the last 168 hours. Cardiac Enzymes: No results for input(s): "CKTOTAL", "CKMB", "CKMBINDEX", "TROPONINI" in the last 168 hours. BNP: Invalid input(s): "POCBNP" CBG: Recent Labs  Lab 01/28/22 1224 01/28/22 1647  GLUCAP 231* 137*   D-Dimer No results for input(s): "DDIMER" in the last 72 hours. Hgb A1c No results for input(s): "HGBA1C" in the last 72 hours. Lipid Profile No results for input(s): "CHOL", "HDL", "LDLCALC", "TRIG", "CHOLHDL", "LDLDIRECT" in the last 72 hours. Thyroid function studies No results for input(s): "TSH", "T4TOTAL", "T3FREE", "THYROIDAB" in the last 72 hours.  Invalid input(s): "FREET3" Anemia work up No results for input(s): "VITAMINB12", "FOLATE", "FERRITIN", "TIBC", "IRON", "RETICCTPCT" in the last 72 hours. Urinalysis    Component Value Date/Time    COLORURINE STRAW (A) 12/28/2021 1821   APPEARANCEUR CLEAR 12/28/2021 1821   LABSPEC 1.003 (L) 12/28/2021 1821   PHURINE 7.0 12/28/2021 1821   GLUCOSEU >=500 (A) 12/28/2021 1821   HGBUR NEGATIVE 12/28/2021 1821   BILIRUBINUR NEGATIVE 12/28/2021 1821   KETONESUR NEGATIVE 12/28/2021 1821   PROTEINUR NEGATIVE 12/28/2021 1821   NITRITE NEGATIVE 12/28/2021 1821   LEUKOCYTESUR NEGATIVE 12/28/2021 1821   Sepsis Labs No results for input(s): "WBC" in the last 168 hours.  Invalid input(s): "PROCALCITONIN", "LACTICIDVEN" Microbiology No results found for this or any previous visit (from the  past 240 hour(s)).  Time coordinating discharge: 25 minutes  SIGNED: Antonieta Pert, MD  [facility] 02/04/2022, 10:43 AM  If 7PM-7AM, please contact night-coverage [email or web address]

## 2022-02-04 NOTE — TOC Transition Note (Signed)
Transition of Care [facility]) - CM/SW Discharge Note   Patient Details  Name: Leslie Chandler MRN: [id number] Date of Birth: [birth year]  [facility] CM/SW Contact:  Bartholome Bill, RN Phone Number: 02/04/2022, 11:18 AM   Clinical Narrative:    [facility] rescinded bed offer. Only other bed offer at this time is [facility]. Sister aware and accepts SNF bed. [facility] contacted for transport. Yellow DNR on the chart for transport. RN to call report to [phone].     Barriers to Discharge: Continued Medical Work up       Discharge Plan and Services In-house Referral: Clinical Social Work Discharge Planning Services: CM Consult              Readmission Risk Interventions    01/27/2022    3:38 PM 01/24/2022    3:20 PM  Readmission Risk Prevention Plan  Transportation Screening Complete Complete  PCP or Specialist Appt within 5-7 Days Complete Complete  Home Care Screening Complete Complete  Medication Review (RN CM) Complete

## 2022-03-12 ENCOUNTER — Non-Acute Institutional Stay: Payer: Medicare Other | Admitting: Student

## 2022-03-12 DIAGNOSIS — Z515 Encounter for palliative care: Secondary | ICD-10-CM

## 2022-03-12 DIAGNOSIS — R0602 Shortness of breath: Secondary | ICD-10-CM

## 2022-03-12 DIAGNOSIS — R531 Weakness: Secondary | ICD-10-CM

## 2022-03-12 DIAGNOSIS — K449 Diaphragmatic hernia without obstruction or gangrene: Secondary | ICD-10-CM

## 2022-03-19 NOTE — Progress Notes (Signed)
[facility] Consult Note Telephone: [phone]  Fax: [phone]   Date of encounter: 03/12/2022  PATIENT NAME: Leslie Chandler [address] [place] [address]   [phone] (home)  DOB: [birth year] MRN: [id number] PRIMARY CARE PROVIDER:    Vilinda Boehringer, NP  REFERRING PROVIDER:   Vilinda Boehringer, NP  RESPONSIBLE PARTY:    Contact Information     Name Relation Home Work Mobile   Leslie Chandler,[name] Sister [phone]  [phone]   [name]   [phone]        I met face to face with patient in the facility. Palliative Care was asked to follow this patient by consultation request of  Vilinda Boehringer, NP to address advance care planning and complex medical decision making. This is the initial visit.                                     ASSESSMENT AND PLAN / RECOMMENDATIONS:   Advance Care Planning/Goals of Care: Goals include to maximize quality of life and symptom management. Patient/health care surrogate gave his/her permission to discuss.Our advance care planning conversation included a discussion about:    The value and importance of advance care planning  Experiences with loved ones who have been seriously ill or have died  Exploration of personal, cultural or spiritual beliefs that might influence medical decisions  Exploration of goals of care in the event of a sudden injury or illness  CODE STATUS: DNR  Education provided on Palliative Medicine vs. Hospice services. Will discuss [place] with patient's sister.  Symptom Management/Plan:  Generalized weakness, debility-secondary to HF, recent hospitalization. Patient is w/c bound. Staff to continue assisting with adl's. Out of bed daily to w/c. Therapy as directed.  Shortness of breath-hx of asthma, acute respiratory failure, HF, hx of pneumonia, large hiatal hernia. Patient currently on room air; continue oxygen to keep sats 90> or  greater; fluticasone 2 puffs daily PRN shortness of breath.   Hiatal hernia, GERD-patient with large hiatal hernia, not a surgical candidate. Continue with aspiration precautions, mechanical soft diet. Continue Protonix daily.  Follow up Palliative Care Visit: Palliative care will continue to follow for complex medical decision making, advance care planning, and clarification of goals. Return in 6 weeks or prn.   This visit was coded based on medical decision making (MDM).   HOSPICE ELIGIBILITY/DIAGNOSIS: TBD  Chief Complaint: Palliative Medicine initial consult.  HISTORY OF PRESENT ILLNESS:  Leslie Chandler is a 87 y.o. year old female  with intelectual disability, epilepsy, hypertension, acute on chronic diastolic HF, asthma, R4YH, acute respiratory failure with hypoxia, GERD without esophagitis, hiatal hernia-not a surgical candidate, IBS, CKD 3, hypothyroidism, seizure disorder, hx of recurrent pneumonia.  Most recent hospitalization 6/7-6/20/23.  Patient currently at [facility]. Patient is currently receiving therapy per staff. She is out of bed daily to w/c. She needs to be feb per staff; appetite has been fair to good. She is receiving a mechanical soft diet with thin liquids. Staff endorse occasional cough. No seizure activity reported.  Patient received sitting up to w/c in her room. She has pleasant affect. She does attempt to answer some questions, delayed response. She does not exhibit any signs of pain, discomfort or shortness of breath. HPI and ROS primarily obtained per staff d/t patient's cognition.  History obtained from review of EMR, discussion with primary team,  and interview with family, facility staff/caregiver and/or Ms. Saggese.  I reviewed available labs, medications, imaging, studies and related documents from the EMR.  Records reviewed and summarized above.   Physical Exam: Pulse 80, resp 20, sats 94% on room air Constitutional: NAD General: frail  appearing, chronically ill appearing EYES: anicteric sclera, lids intact, no discharge  ENMT: intact hearing, oral mucous membranes moist, dentition intact CV: S1S2, RRR, no LE edema Pulmonary: upper lobes clear, no increased work of breathing, no cough, room air Abdomen:  normo-active BS + 4 quadrants, soft and non tender, no ascites GU: deferred MSK: moves all extremities Skin: warm and dry, no rashes or wounds on visible skin Neuro: +generalized weakness,  A & O to person, familiars Psych: non-anxious affect Hem/lymph/immuno: no widespread bruising CURRENT PROBLEM LIST:  Patient Active Problem List   Diagnosis Date Noted   Acute hypoxemic respiratory failure (HCC) 01/23/2022   SOB (shortness of breath) 01/22/2022   Large hiatal hernia 01/22/2022   CKD (chronic kidney disease) stage 3, GFR 30-59 ml/min ([place]) 01/22/2022   Acute respiratory failure with hypoxia ([facility]) 12/28/2021   AKI (acute kidney injury) ([place]) 12/28/2021   Asthma exacerbation 04/10/2021   Pneumonia due to COVID-19 virus 07/16/2019   Acute respiratory disease due to COVID-19 virus 07/15/2019   Seizure disorder ([place]) 07/15/2019   Hypothyroidism 07/15/2019   Hyperlipidemia 07/15/2019   Acute on chronic diastolic CHF (congestive heart failure) ([place]) 08/08/2015   Aspiration pneumonia ([place]) 08/06/2015   Sepsis ([place]) 08/05/2015   Diarrhea 08/05/2015   Type 2 diabetes mellitus ([place]) 08/05/2015   Lactic acidosis 08/05/2015   CAP (community acquired pneumonia) 04/12/2014   Intellectual disability    Hypertension    Asthma    HCAP (healthcare-associated pneumonia) 04/11/2014   PAST MEDICAL HISTORY:  Active Ambulatory Problems    Diagnosis Date Noted   HCAP (healthcare-associated pneumonia) 04/11/2014   CAP (community acquired pneumonia) 04/12/2014   Intellectual disability    Hypertension    Asthma    Sepsis ([place]) 08/05/2015   Diarrhea 08/05/2015   Type 2 diabetes mellitus ([place]) 08/05/2015   Lactic acidosis  08/05/2015   Aspiration pneumonia ([place]) 08/06/2015   Acute on chronic diastolic CHF (congestive heart failure) ([place]) 08/08/2015   Acute respiratory disease due to COVID-19 virus 07/15/2019   Seizure disorder ([place]) 07/15/2019   Hypothyroidism 07/15/2019   Hyperlipidemia 07/15/2019   Pneumonia due to COVID-19 virus 07/16/2019   Asthma exacerbation 04/10/2021   Acute respiratory failure with hypoxia ([place]) 12/28/2021   AKI (acute kidney injury) ([place]) 12/28/2021   SOB (shortness of breath) 01/22/2022   Large hiatal hernia 01/22/2022   CKD (chronic kidney disease) stage 3, GFR 30-59 ml/min (HCC) 01/22/2022   Acute hypoxemic respiratory failure ([place]) 01/23/2022   Resolved Ambulatory Problems    Diagnosis Date Noted   No Resolved Ambulatory Problems   Past Medical History:  Diagnosis Date   Cognitive developmental delay    Epilepsia [facility])    Mental retardation    Osteoporosis    SOCIAL HX:  Social History   Tobacco Use   Smoking status: Never   Smokeless tobacco: Never  Substance Use Topics   Alcohol use: No   FAMILY HX:  Family History  Problem Relation Age of Onset   Cancer Mother       ALLERGIES: No Known Allergies   PERTINENT MEDICATIONS:  Outpatient Encounter Medications as of 03/12/2022  Medication Sig   acetaminophen (TYLENOL) 500 MG tablet Take 500 mg by mouth every  6 (six) hours as needed for mild pain or headache.   albuterol (PROVENTIL) (2.5 MG/3ML) 0.083% nebulizer solution Take 2.5 mg by nebulization every 4 (four) hours as needed for wheezing or shortness of breath.   aspirin 81 MG chewable tablet Chew 81 mg by mouth daily.    bacitracin 500 UNIT/GM ointment Apply 1 application. topically daily as needed for wound care.   Bismuth Subsalicylate (PEPTO-BISMOL MAX STRENGTH) 525 MG/15ML SUSP Take 5-15 mLs by mouth daily as needed (for indigestion).   calamine lotion Apply 1 application topically daily as needed for itching.   dimenhyDRINATE (DRAMAMINE) 50 MG  tablet Take 50 mg by mouth every 8 (eight) hours as needed for dizziness or nausea (as directed).   diphenhydrAMINE (BENADRYL) 25 MG tablet Take 25 mg by mouth daily as needed for itching.   DM-Phenylephrine-Acetaminophen (SUDAFED PE PRESSURE+PAIN+COUGH PO) Take 5 mLs by mouth every 6 (six) hours as needed (for symptoms).   Ethyl Alcohol, Skin Cleanser, 70 % LIQD Apply 1 application. topically daily as needed (wound care).   Famotidine (PEPCID PO) Take 1 tablet by mouth daily as needed (indigestion).   FARXIGA 5 MG TABS tablet Take 5 mg by mouth daily.   feeding supplement, GLUCERNA SHAKE, (GLUCERNA SHAKE) LIQD Take 237 mLs by mouth 2 (two) times daily between meals.   ferrous sulfate 220 (44 FE) MG/5ML solution Take 220 mg by mouth every evening.   fluticasone (FLOVENT HFA) 110 MCG/ACT inhaler Inhale 2 puffs into the lungs 2 (two) times daily.   guaiFENesin-dextromethorphan (ROBITUSSIN DM) 100-10 MG/5ML syrup Take 10 mLs by mouth every 6 (six) hours as needed for cough.   hydrocortisone cream 1 % Apply 1 application topically daily as needed for itching.   hydrogen peroxide 3 % external solution Apply 1 application. topically daily as needed (wound care).   Lactobacillus-Inulin (VIACTIV DIGESTIVE HEALTH) CHEW Chew 1 tablet by mouth daily.   levETIRAcetam (KEPPRA) 100 MG/ML solution Take 2.5 mLs by mouth 2 (two) times daily.   levothyroxine (SYNTHROID) 88 MCG tablet Take 1 tablet (88 mcg total) by mouth every Monday, Wednesday, and Friday.   loperamide (IMODIUM A-D) 2 MG tablet Take 2 mg by mouth 4 (four) times daily as needed for diarrhea or loose stools.   magnesium hydroxide (MILK OF MAGNESIA) 400 MG/5ML suspension Take 10 mLs by mouth daily as needed for mild constipation.   montelukast (SINGULAIR) 10 MG tablet Take 10 mg by mouth every evening.    pantoprazole (PROTONIX) 40 MG tablet Take 40 mg by mouth daily.   petrolatum-hydrophilic-aloe vera (ALOE VESTA) ointment Apply 1 application.  topically 2 (two) times daily as needed for wound care.   pravastatin (PRAVACHOL) 20 MG tablet Take 20 mg by mouth every evening.    No facility-administered encounter medications on file as of 03/12/2022.   Thank you for the opportunity to participate in the care of Ms. Cifuentes.  The palliative care team will continue to follow. Please call our office at [phone] if we can be of additional assistance.   Ezekiel Slocumb, NP   COVID-19 PATIENT SCREENING TOOL Asked and negative response unless otherwise noted:  Have you had symptoms of covid, tested positive or been in contact with someone with symptoms/positive test in the past 5-10 days? No

## 2022-03-21 ENCOUNTER — Telehealth: Payer: Self-pay | Admitting: Student

## 2022-03-21 NOTE — Telephone Encounter (Signed)
Palliative NP left message for sister Ms. [name]; awaiting return call.

## 2022-03-21 NOTE — Telephone Encounter (Signed)
Received return call from patient's sister. Discussed Palliative Medicine, goals of care. Patient will be at [facility] long term. She reports patient adjusting well to facility. Palliative Medicine will continue to follow and provide supportive care.

## 2022-04-18 ENCOUNTER — Non-Acute Institutional Stay: Payer: Medicare Other | Admitting: Student

## 2022-04-18 DIAGNOSIS — R0602 Shortness of breath: Secondary | ICD-10-CM

## 2022-04-18 DIAGNOSIS — K449 Diaphragmatic hernia without obstruction or gangrene: Secondary | ICD-10-CM

## 2022-04-18 DIAGNOSIS — Z515 Encounter for palliative care: Secondary | ICD-10-CM

## 2022-04-18 DIAGNOSIS — K219 Gastro-esophageal reflux disease without esophagitis: Secondary | ICD-10-CM

## 2022-04-18 NOTE — Progress Notes (Signed)
[facility] Consult Note Telephone: [phone]  Fax: [phone]    Date of encounter: 04/18/22 1:38 PM PATIENT NAME: Leslie Chandler [address] [place] [address]   [phone] (home)  DOB: 1935/04/24 MRN: [id number] PRIMARY CARE PROVIDER:    Vilinda Boehringer, NP  REFERRING PROVIDER:   Vilinda Boehringer, NP RESPONSIBLE PARTY:    Contact Information     Name Relation Home Work Mobile   [name],[name] Sister [phone]  [phone]   [name]   [phone]        I met face to face with patient and family in the facility. Palliative Care was asked to follow this patient by consultation request of  Vilinda Boehringer, NP to address advance care planning and complex medical decision making. This is a follow up visit.                                   ASSESSMENT AND PLAN / RECOMMENDATIONS:   Advance Care Planning/Goals of Care: Goals include to maximize quality of life and symptom management. Patient/health care surrogate gave his/her permission to discuss.  CODE STATUS: DNR  Education provided on Palliative Medicine. Patient's sister present during visit. Will continue to provide support care, symptom management as needed.   Symptom Management/Plan:  Hiatal hernia, GERD-patient with large hiatal hernia, not a surgical candidate. Continue with aspiration precautions, mechanical soft diet. Continue Protonix daily. Sister reports patient is coughing less when eating and drinking.   Shortness of breath-hx of asthma, acute respiratory failure, HF, hx of pneumonia, large hiatal hernia. Shortness of breath is stable. Patient currently on room air; continue oxygen to keep sats 90> or greater; fluticasone 2 puffs daily PRN shortness of breath.   Adjustment to facility-patient sister visits regularly. She reports patient still asking to go home at times. Encourage patient to participate in activities,  come out of room, have meals in common area.   Follow up Palliative Care Visit: Palliative care will continue to follow for complex medical decision making, advance care planning, and clarification of goals. Return in 8 weeks or prn.   This visit was coded based on medical decision making (MDM).  PPS: 40%  HOSPICE ELIGIBILITY/DIAGNOSIS: TBD  Chief Complaint: Palliative Medicine follow up visit.   HISTORY OF PRESENT ILLNESS:  Leslie Chandler is a 87 y.o. year old female  with intelectual disability, epilepsy, hypertension, acute on chronic diastolic HF, asthma, M8YO, acute respiratory failure with hypoxia, GERD without esophagitis, hiatal hernia-not a surgical candidate, IBS, CKD 3, hypothyroidism, seizure disorder, hx of recurrent pneumonia.  Most recent hospitalization 6/7-6/20/23.  Patient resides at at [facility]. Sister [name] is present; she reports patient still having some difficulty with adjusting to facility and will sometimes ask to return home. She does attempt to redirect. Patient is out of bed daily. She does require assistance with feeding, fed by family/staff. Appetite continues to be good. She is coughing less with eating and drinking per sister. Receiving mechanical soft diet. No seizure activity or falls reported. No recent infections. Patient received sitting up to w/c. She answers direct questions. She denies pain, occasional shortness of breath. She is sleeping well at night. HPI primarily obtained per sister [name] and staff.   History obtained from review of EMR, discussion with primary team, and interview with family, facility staff/caregiver and/or Ms. Steinhardt.  I reviewed available labs,  medications, imaging, studies and related documents from the EMR.  Records reviewed and summarized above.   ROS  Obtained primarily from sister and staff.   Physical Exam: Weight: 159 pounds Pulse 60, resp 16, b/p 138/82, sats 97% on room air Constitutional: NAD General:  frail appearing EYES: anicteric sclera, lids intact, no discharge  ENMT: intact hearing, oral mucous membranes moist CV: S1S2, RRR, trace LE edema Pulmonary: LCTA, no increased work of breathing, no cough, room air Abdomen: normo-active BS + 4 quadrants, soft and non tender GU: deferred MSK: moves all extremities Skin: warm and dry, no rashes or wounds on visible skin Neuro: A & O to person, familiars Psych: non-anxious affect, cooperative Hem/lymph/immuno: no widespread bruising   Thank you for the opportunity to participate in the care of Ms. Utt.  The palliative care team will continue to follow. Please call our office at [phone] if we can be of additional assistance.   Ezekiel Slocumb, NP   COVID-19 PATIENT SCREENING TOOL Asked and negative response unless otherwise noted:   Have you had symptoms of covid, tested positive or been in contact with someone with symptoms/positive test in the past 5-10 days? No

## 2022-06-13 ENCOUNTER — Non-Acute Institutional Stay: Payer: Medicare Other | Admitting: Student

## 2022-06-13 DIAGNOSIS — R600 Localized edema: Secondary | ICD-10-CM

## 2022-06-13 DIAGNOSIS — K449 Diaphragmatic hernia without obstruction or gangrene: Secondary | ICD-10-CM

## 2022-06-13 DIAGNOSIS — Z515 Encounter for palliative care: Secondary | ICD-10-CM

## 2022-06-13 DIAGNOSIS — R0602 Shortness of breath: Secondary | ICD-10-CM

## 2022-06-13 DIAGNOSIS — K219 Gastro-esophageal reflux disease without esophagitis: Secondary | ICD-10-CM

## 2022-06-16 NOTE — Progress Notes (Addendum)
[facility] Consult Note Telephone: [phone]  Fax: [phone]    Date of encounter: 06/13/22  PATIENT NAME: Leslie Chandler [address] [place] [address]   [phone] (home)  DOB: 1935/01/07 MRN: [id number] PRIMARY CARE PROVIDER:    Vilinda Boehringer, NP  REFERRING PROVIDER:   Vilinda Boehringer, NP  RESPONSIBLE PARTY:    Contact Information     Name Relation Home Work Mobile   [name],"Annie"Chandler Sister [phone]  [phone]   [name]   [phone]        I met face to face with patient and family in the facility. Palliative Care was asked to follow this patient by consultation request of Leslie Boehringer, NP to address advance care planning and complex medical decision making. This is a follow up visit.                                   ASSESSMENT AND PLAN / RECOMMENDATIONS:   Advance Care Planning/Goals of Care: Goals include to maximize quality of life and symptom management. Patient/health care surrogate gave his/her permission to discuss. Our advance care planning conversation included a discussion about:    The value and importance of advance care planning  Experiences with loved ones who have been seriously ill or have died  Exploration of personal, cultural or spiritual beliefs that might influence medical decisions  Exploration of goals of care in the event of a sudden injury or illness  CODE STATUS: DNR  Education provided on Palliative Medicine. Patient's sister present during visit. Will continue to provide support care, symptom management as needed.   Symptom Management/Plan:  Shortness of breath-hx of asthma, acute respiratory failure, HF, hx of pneumonia, large hiatal hernia. Shortness of breath is stable. Patient currently on room air; continue oxygen to keep sats 90> or greater; fluticasone 2 puffs daily PRN shortness of breath.   LE edema-d/t heart failure. Elevate  legs while in recliner or in bed. Start wearing compression socks daily.  Hiatal hernia, GERD-patient with large hiatal hernia, not a surgical candidate. Continue with aspiration precautions, mechanical soft diet. Continue Protonix daily. Patient occasionally coughs with eating and drinking, although this has improved some. Patient to remain sitting up for at least 30-60 minutes after eating/drinking.    Follow up Palliative Care Visit: Palliative care will continue to follow for complex medical decision making, advance care planning, and clarification of goals. Return in 6-8 weeks or prn.   This visit was coded based on medical decision making (MDM).  PPS: 40%  HOSPICE ELIGIBILITY/DIAGNOSIS: TBD  Chief Complaint: Palliative Medicine follow up visit.   HISTORY OF PRESENT ILLNESS:  Leslie Chandler is a 87 y.o. year old female  with intelectual disability, epilepsy, hypertension, acute on chronic diastolic HF, asthma, E9HB, acute respiratory failure with hypoxia, GERD without esophagitis, hiatal hernia-not a surgical candidate, IBS, CKD 3, hypothyroidism, seizure disorder, hx of recurrent pneumonia.  Most recent hospitalization 6/7-6/20/23.   Patient resides at at [facility]. Sister Leslie Chandler is present. Patient is adjusting to being at facility; sister visits daily. Patient requires assistance with all adl's including feeding. Her appetite is good. She is still coughing some with eating and drinking. She is receiving a mechanical soft diet. No recent infections, no seizure activity, no falls reported. She does have edema to BLE; she is up to w/c daily. Patient received sitting  up to w/c; she is matching cards. She does have a delayed response, but able to answer direct questions. She endorses shortness of breath; otherwise no complaints. HPI primarily obtained per sister Leslie Chandler and staff.     History obtained from review of EMR, discussion with primary team, and interview with family,  facility staff/caregiver and/or [name].  I reviewed available labs, medications, imaging, studies and related documents from the EMR.  Records reviewed and summarized above.   ROS  Obtained primarily from sister and staff.  Physical Exam: Pulse 84, resp 16, b/p 152/88, sats 93% on room air Constitutional: NAD General: frail appearing EYES: anicteric sclera, lids intact, no discharge  ENMT: intact hearing, oral mucous membranes moist, dentition intact CV: S1S2, RRR, 1+ LE edema Pulmonary:scattered rhonchi, no increased work of breathing, non-productive cough, room air Abdomen: normo-active BS + 4 quadrants, soft and non tender, no ascites GU: deferred MSK: moves all extremities Skin: warm and dry, no rashes or wounds on visible skin Neuro:  +generalized weakness, + cognitive impairment Psych: non-anxious affect, A and O to person, familiars Hem/lymph/immuno: no widespread bruising   Thank you for the opportunity to participate in the care of Leslie Chandler.  The palliative care team will continue to follow. Please call our office at [phone] if we can be of additional assistance.   Leslie Slocumb, NP   COVID-19 PATIENT SCREENING TOOL Asked and negative response unless otherwise noted:   Have you had symptoms of covid, tested positive or been in contact with someone with symptoms/positive test in the past 5-10 days? No

## 2022-08-05 ENCOUNTER — Non-Acute Institutional Stay: Payer: Medicare Other | Admitting: Hospice

## 2022-08-05 DIAGNOSIS — R0602 Shortness of breath: Secondary | ICD-10-CM

## 2022-08-05 DIAGNOSIS — R600 Localized edema: Secondary | ICD-10-CM

## 2022-08-05 DIAGNOSIS — K219 Gastro-esophageal reflux disease without esophagitis: Secondary | ICD-10-CM

## 2022-08-05 DIAGNOSIS — Z515 Encounter for palliative care: Secondary | ICD-10-CM

## 2022-08-05 DIAGNOSIS — K449 Diaphragmatic hernia without obstruction or gangrene: Secondary | ICD-10-CM

## 2022-08-05 NOTE — Progress Notes (Addendum)
    [facility] Consult Note Telephone: [phone]  Fax: [phone]     PATIENT NAME: Leslie Chandler [address] [place] [address]   [phone] (home)  DOB: 20-Sep-1934 MRN: [id number] PRIMARY CARE PROVIDER:    Vilinda Boehringer, NP  REFERRING PROVIDER:   Vilinda Boehringer, NP  RESPONSIBLE PARTY:    Contact Information     Name Relation Home Work Mobile   [name],[name] Sister [phone]  [phone]   [name]   [phone]        I met face to face with patient and family in the facility. Palliative Care was asked to follow this patient by consultation request of Vilinda Boehringer, NP to address advance care planning and complex medical decision making. This is a follow up visit.  Patient's sister [name] was present with patient during visit. Visit consisted of counseling and education dealing with the complex and emotionally intense issues of symptom management and palliative care in the setting of serious and potentially life-threatening illness. Palliative care team will continue to support patient, patient's family, and medical team.                                   ASSESSMENT AND PLAN / RECOMMENDATIONS:   Goals of Care: Goals include to maximize quality of life and symptom management.  CODE STATUS: DNR  Symptom Management/Plan:  Shortness of breath-stable since treatment for pneumonia. Patient currently on room air; continue oxygen 2 L/min as needed to keep sats 90> or greater;   LE edema-d/t heart failure. Elevate legs while in recliner or in bed.  Continue wearing compression socks daily.  Monitor weight and report weight gain of 2 pounds in a day or 5 pounds in a week  Hiatal hernia, GERD-patient with large hiatal hernia, not a surgical candidate. Continue with aspiration precautions, mechanical soft diet. Continue Protonix daily; remain sitting up for at least 30-60 minutes after  eating/drinking.    Follow up Palliative Care Visit: Palliative care will continue to follow for complex medical decision making, advance care planning, and clarification of goals. Return in 6-8 weeks or prn.  PPS: 40%  HOSPICE ELIGIBILITY/DIAGNOSIS: TBD  Chief Complaint: Palliative Medicine follow up visit.   HISTORY OF PRESENT ILLNESS:  Leslie Chandler is a 87 y.o. year old female  with intelectual disability, epilepsy, hypertension, acute on chronic diastolic HF, asthma, N9GX, acute respiratory failure with hypoxia, GERD without esophagitis, hiatal hernia-not a surgical candidate, IBS, CKD 3, hypothyroidism, seizure disorder, hx of recurrent pneumonia.  History obtained from review of EMR, discussion with primary team, and interview with family, facility staff/caregiver and/or Ms. Kersh.  I reviewed available labs, medications, imaging, studies and related documents from the EMR.  Records reviewed and summarized above.   I spent 45 minutes providing this consultation; time includes spent with patient/family, chart review and documentation. More than 50% of the time in this consultation was spent on care coordination.  Thank you for the opportunity to participate in the care of Ms. Salaam.  The palliative care team will continue to follow. Please call our office at [phone] if we can be of additional assistance.   Teodoro Spray, NP

## 2022-09-10 ENCOUNTER — Non-Acute Institutional Stay: Payer: Medicare Other | Admitting: Hospice

## 2022-09-10 DIAGNOSIS — Z515 Encounter for palliative care: Secondary | ICD-10-CM

## 2022-09-10 DIAGNOSIS — R600 Localized edema: Secondary | ICD-10-CM

## 2022-09-10 DIAGNOSIS — K219 Gastro-esophageal reflux disease without esophagitis: Secondary | ICD-10-CM

## 2022-09-10 DIAGNOSIS — R531 Weakness: Secondary | ICD-10-CM

## 2022-09-10 NOTE — Progress Notes (Signed)
    [facility] Consult Note Telephone: [phone]  Fax: [phone]     PATIENT NAME: Leslie Chandler [address] [place] [address]   [phone] (home)  DOB: 01/06/1935 MRN: [id number] PRIMARY CARE PROVIDER:    Vilinda Boehringer, NP  REFERRING PROVIDER:   Vilinda Boehringer, NP  RESPONSIBLE PARTY:    Contact Information     Name Relation Home Work Mobile   [name],"Leslie Chandler Sister [phone]  [phone]   [name]   [phone]        I met face to face with patient and family in the facility. Palliative Care was asked to follow this patient by consultation request of Vilinda Boehringer, NP to address advance care planning and complex medical decision making. This is a follow up visit.  Patient's sister Leslie Chandler was present with patient during visit. Leslie Addison RN is with NP for joint visit today.   Visit consisted of counseling and education dealing with the complex and emotionally intense issues of symptom management and palliative care in the setting of serious and potentially life-threatening illness. Palliative care team will continue to support patient, patient's family, and medical team.                                   ASSESSMENT AND PLAN / RECOMMENDATIONS:   Goals of Care: Goals include to maximize quality of life and symptom management.  CODE STATUS: DNR  Symptom Management/Plan: Weakness: Generalized.  PT for strengthening and gait training.  High fall risk- fall and safety precautions.  LE edema-d/t heart failure.  Stable.  Elevate legs while in recliner or in bed.  Continue wearing compression socks daily.  Monitor weight and report weight gain of 2 pounds in a day or 5 pounds in a week   GERD- patient with large hiatal hernia, not a surgical candidate. Continue with aspiration precautions, mechanical soft diet. Continue Protonix daily; remain sitting up for at least 30-60 minutes after  eating/drinking.   Follow up Palliative Care Visit: Palliative care will continue to follow for complex medical decision making, advance care planning, and clarification of goals. Return in 6-8 weeks or prn.  PPS: 40%  HOSPICE ELIGIBILITY/DIAGNOSIS: TBD  Chief Complaint: Palliative Medicine follow up visit.   HISTORY OF PRESENT ILLNESS:  Leslie Chandler is a 88 y.o. year old female  with intelectual disability, epilepsy, hypertension, acute on chronic diastolic HF, asthma, O7SJ, acute respiratory failure with hypoxia, GERD without esophagitis, hiatal hernia-not a surgical candidate, IBS, CKD 3, hypothyroidism, seizure disorder, hx of recurrent pneumonia.  History obtained from review of EMR, discussion with primary team, and interview with family, facility staff/caregiver and/or Leslie Chandler.  I reviewed available labs, medications, imaging, studies and related documents from the EMR.  Records reviewed and summarized above.   I spent 35 minutes providing this consultation; time includes spent with patient/family, chart review and documentation. More than 50% of the time in this consultation was spent on care coordination.  Thank you for the opportunity to participate in the care of Leslie Chandler.  The palliative care team will continue to follow. Please call our office at [phone] if we can be of additional assistance.   Teodoro Spray, NP

## 2022-10-08 ENCOUNTER — Non-Acute Institutional Stay: Payer: Medicare Other | Admitting: Hospice

## 2022-10-08 DIAGNOSIS — R600 Localized edema: Secondary | ICD-10-CM

## 2022-10-08 DIAGNOSIS — Z515 Encounter for palliative care: Secondary | ICD-10-CM

## 2022-10-08 DIAGNOSIS — K219 Gastro-esophageal reflux disease without esophagitis: Secondary | ICD-10-CM

## 2022-10-08 DIAGNOSIS — R531 Weakness: Secondary | ICD-10-CM

## 2022-10-08 NOTE — Progress Notes (Signed)
    [facility] Consult Note Telephone: [phone]  Fax: [phone]     PATIENT NAME: Leslie Chandler [address] [place] [address]   [phone] (home)  DOB: 04-14-1935 MRN: [id number] PRIMARY CARE PROVIDER:    Vilinda Boehringer, NP  REFERRING PROVIDER:   Vilinda Boehringer, NP  RESPONSIBLE PARTY:    Contact Information     Name Relation Home Work Mobile   Leslie Chandler,"Leslie Chandler"Leslie Chandler Sister [phone]  [phone]   Leslie Chandler   [phone]        I met face to face with patient and family in the facility. Palliative Care was asked to follow this patient by consultation request of Leslie Boehringer, NP to address advance care planning and complex medical decision making. This is a follow up visit.  Patient's sister Leslie Chandler was present with patient during visit.   Visit consisted of counseling and education dealing with the complex and emotionally intense issues of symptom management and palliative care in the setting of serious and potentially life-threatening illness. Palliative care team will continue to support patient, patient's family, and medical team.                                   ASSESSMENT AND PLAN / RECOMMENDATIONS:   Goals of Care: Goals include to maximize quality of life and symptom management.  CODE STATUS: DNR  Symptom Management/Plan: Weakness: Generalized.   Completed PT last week  for strengthening. Wheel chair bound, non ambulatory. High fall risk-  continue fall and safety precautions.  Encourage activities as tolerated to optimize wellbeing.  LE edema-d/t heart failure.  Stable, no edema.  Elevate legs while in recliner or in bed.  Continue wearing compression socks daily.  Monitor weight and report weight gain of 2 pounds in a day or 5 pounds in a week   GERD-improved.  Continue Protonix daily; remain sitting up for at least 30-60 minutes after eating/drinking. Patient with large  hiatal hernia, not a surgical candidate. Continue with aspiration precautions, mechanical soft diet.   Follow up Palliative Care Visit: Palliative care will continue to follow for complex medical decision making, advance care planning, and clarification of goals. Return in 6-8 weeks or prn.  PPS: 40%  HOSPICE ELIGIBILITY/DIAGNOSIS: TBD  Chief Complaint: Palliative Medicine follow up visit.   HISTORY OF PRESENT ILLNESS:  Leslie Chandler is a 88 y.o. year old female  with intelectual disability, epilepsy, hypertension, acute on chronic diastolic HF, asthma, 123456, acute respiratory failure with hypoxia, GERD without esophagitis, hiatal hernia-not a surgical candidate, IBS, CKD 3, hypothyroidism, seizure disorder, hx of recurrent pneumonia. Nursing and patient's sister report patient had RSV from which she recovered; in no respiratory distress.  History obtained from review of EMR, discussion with primary team, and interview with family, facility staff/caregiver and/or Leslie Chandler.  I reviewed available labs, medications, imaging, studies and related documents from the EMR.  Records reviewed and summarized above.   I spent 35 minutes providing this consultation; time includes spent with patient/family, chart review and documentation. More than 50% of the time in this consultation was spent on care coordination.  Thank you for the opportunity to participate in the care of Leslie Chandler.  The palliative care team will continue to follow. Please call our office at [phone] if we can be of additional assistance.   Teodoro Spray, NP

## 2022-11-20 ENCOUNTER — Non-Acute Institutional Stay: Payer: Medicare Other | Admitting: Hospice

## 2022-11-20 DIAGNOSIS — K219 Gastro-esophageal reflux disease without esophagitis: Secondary | ICD-10-CM

## 2022-11-20 DIAGNOSIS — R531 Weakness: Secondary | ICD-10-CM

## 2022-11-20 DIAGNOSIS — Z515 Encounter for palliative care: Secondary | ICD-10-CM

## 2022-11-20 DIAGNOSIS — R600 Localized edema: Secondary | ICD-10-CM

## 2022-11-20 NOTE — Progress Notes (Signed)
    [facility] Consult Note Telephone: [phone]  Fax: [phone]     PATIENT NAME: Leslie Chandler [address] [place] [address]   [phone] (home)  DOB: [birth year] MRN: [id number] PRIMARY CARE PROVIDER:    Vilinda Boehringer, NP  REFERRING PROVIDER:   Vilinda Boehringer, NP  RESPONSIBLE PARTY:    Contact Information     Name Relation Home Work Mobile   Leslie Chandler,"Leslie"Chandler Sister [phone]  [phone]   Laural Chandler   [phone]        I met face to face with patient and family in the facility. Palliative Care was asked to follow this patient by consultation request of Vilinda Boehringer, NP to address advance care planning and complex medical decision making. This is a follow up visit.  Visit consisted of counseling and education dealing with the complex and emotionally intense issues of symptom management and palliative care in the setting of serious and potentially life-threatening illness. Palliative care team will continue to support patient, patient's family, and medical team.                                   ASSESSMENT AND PLAN / RECOMMENDATIONS:   Goals of Care: Goals include to maximize quality of life and symptom management.  CODE STATUS: DNR  Symptom Management/Plan: Weakness: Recently completed physical therapy, still nonambulatory, wheel chair bound,. High fall risk-  continue fall and safety precautions.  Encourage activities as tolerated to optimize wellbeing.  Balance of rest and performance activity; energy conservation.  LE edema-d/t heart failure.  No edema.  Continue to elevate legs while in recliner or in bed.  Continue wearing compression socks daily.  Monitor weight and report weight gain of 2 pounds in a day or 5 pounds in a week   GERD-improved, no symptoms reported.  Continue Protonix daily; remain sitting up for at least 30-60 minutes after eating/drinking.  Patient  with large hiatal hernia, not a surgical candidate. Continue with aspiration precautions, mechanical soft diet.   Follow up Palliative Care Visit: Palliative care will continue to follow for complex medical decision making, advance care planning, and clarification of goals. Return in 6-8 weeks or prn.  PPS: 40%  HOSPICE ELIGIBILITY/DIAGNOSIS: TBD  Chief Complaint: Palliative Medicine follow up visit.   HISTORY OF PRESENT ILLNESS:  Leslie Chandler is a 88 y.o. year old female  with intelectual disability, epilepsy, hypertension, acute on chronic diastolic HF, asthma, 123456, acute respiratory failure with hypoxia, GERD without esophagitis, hiatal hernia-not a surgical candidate, IBS, CKD 3, hypothyroidism, seizure disorder, hx of recurrent pneumonia.  Patient reports doing well overall, denies pain/discomfort, in no distress. History obtained from review of EMR, discussion with primary team, and interview with family, facility staff/caregiver and/or [name].  I reviewed available labs, medications, imaging, studies and related documents from the EMR.  Records reviewed and summarized above.   I spent 35 minutes providing this consultation; time includes spent with patient/family, chart review and documentation. More than 50% of the time in this consultation was spent on care coordination.  Thank you for the opportunity to participate in the care of [name].  The palliative care team will continue to follow. Please call our office at [phone] if we can be of additional assistance.   Teodoro Spray, NP

## 2022-12-26 ENCOUNTER — Non-Acute Institutional Stay: Payer: Medicare Other | Admitting: Hospice

## 2022-12-26 DIAGNOSIS — K219 Gastro-esophageal reflux disease without esophagitis: Secondary | ICD-10-CM

## 2022-12-26 DIAGNOSIS — R600 Localized edema: Secondary | ICD-10-CM

## 2022-12-26 DIAGNOSIS — Z515 Encounter for palliative care: Secondary | ICD-10-CM

## 2022-12-26 DIAGNOSIS — R531 Weakness: Secondary | ICD-10-CM

## 2022-12-26 NOTE — Progress Notes (Signed)
[facility] Consult Note Telephone: [phone]  Fax: [phone]     PATIENT NAME: Leslie Chandler [address] [place] [address]   [phone] (home)  DOB: 1935-01-22 MRN: [id number] PRIMARY CARE PROVIDER:    Irven Baltimore, NP  REFERRING PROVIDER:   Irven Baltimore, NP  RESPONSIBLE PARTY:    Contact Information     Name Relation Home Work Mobile   [name],"Leslie Chandler Sister [phone]  [phone]   Leslie Chandler   [phone]        I met face to face with patient and family in the facility. Palliative Care was asked to follow this patient by consultation request of Leslie Baltimore, NP to address advance care planning and complex medical decision making. This is a follow up visit.  Patient's sister Leslie Chandler is present with her during visit Visit consisted of counseling and education dealing with the complex and emotionally intense issues of symptom management and palliative care in the setting of serious and potentially life-threatening illness. Palliative care team will continue to support patient, patient's family, and medical team.                                   ASSESSMENT AND PLAN / RECOMMENDATIONS:   Goals of Care: Goals include to maximize quality of life and symptom management.  CODE STATUS: DNR  Symptom Management/Plan: Patient is stable at this time, no changes to plan of care. Weakness: Nonambulatory.  Recently completed physical therapy, wheel chair bound,. High fall risk-  continue fall and safety precautions.  Encourage activities as tolerated to optimize wellbeing.   Balance of rest and performance activity; energy conservation.  LE edema-d/t heart failure.  No edema.  Continue to elevate legs while in recliner or in bed.  Continue wearing compression socks daily.  Monitor weight and report weight gain of 2 pounds in a day or 5 pounds in a week   GERD-improved, no symptoms  reported.  Continue Protonix daily; remain sitting up for at least 30-60 minutes after eating/drinking.  Patient with large hiatal hernia, not a surgical candidate. Continue with aspiration precautions, mechanical soft diet.   Follow up Palliative Care Visit: Palliative care will continue to follow for complex medical decision making, advance care planning, and clarification of goals. Return in 6-8 weeks or prn.  PPS: 40%  HOSPICE ELIGIBILITY/DIAGNOSIS: TBD  Chief Complaint: Palliative Medicine follow up visit.   HISTORY OF PRESENT ILLNESS:  Leslie Chandler is a 88 y.o. year old female  with intelectual disability, epilepsy, hypertension, acute on chronic diastolic HF, asthma, T2DM, acute respiratory failure with hypoxia, GERD without esophagitis, hiatal hernia-not a surgical candidate, IBS, CKD 3, hypothyroidism, seizure disorder, hx of recurrent pneumonia.  Patient reports doing well overall, denies pain/discomfort, in no distress.  She participates in facility activities.  Her sister Leslie Chandler with no concerns.  Nursing with no concerns. History obtained from review of EMR, discussion with primary team, and interview with family, facility staff/caregiver and/or [name].  I reviewed available labs, medications, imaging, studies and related documents from the EMR.  Records reviewed and summarized above.   I spent 35 minutes providing this consultation; time includes spent with patient/family, chart review and documentation. More than 50% of the time in this consultation was spent on care coordination.  Thank you for the opportunity to participate in the care of [name].  The palliative  care team will continue to follow. Please call our office at [phone] if we can be of additional assistance.   Leslie Carpenter, NP
